# Patient Record
Sex: Female | Born: 2000 | Race: White | Hispanic: No | Marital: Single | Smoking: Never smoker
Health system: Southern US, Community
[De-identification: ages and names within clinical notes are randomized; demographics above are authoritative.]

## PROBLEM LIST (undated history)

## (undated) DIAGNOSIS — G8929 Other chronic pain: Secondary | ICD-10-CM

## (undated) DIAGNOSIS — K589 Irritable bowel syndrome without diarrhea: Secondary | ICD-10-CM

## (undated) DIAGNOSIS — R55 Syncope and collapse: Secondary | ICD-10-CM

## (undated) DIAGNOSIS — S060X9A Concussion with loss of consciousness of unspecified duration, initial encounter: Secondary | ICD-10-CM

## (undated) DIAGNOSIS — F32A Depression, unspecified: Secondary | ICD-10-CM

## (undated) DIAGNOSIS — F419 Anxiety disorder, unspecified: Secondary | ICD-10-CM

## (undated) DIAGNOSIS — Z87820 Personal history of traumatic brain injury: Secondary | ICD-10-CM

## (undated) DIAGNOSIS — S060XAA Concussion with loss of consciousness status unknown, initial encounter: Secondary | ICD-10-CM

## (undated) DIAGNOSIS — R519 Headache, unspecified: Secondary | ICD-10-CM

## (undated) HISTORY — PX: NO PAST SURGERIES: SHX2092

## (undated) HISTORY — DX: Concussion with loss of consciousness status unknown, initial encounter: S06.0XAA

## (undated) HISTORY — DX: Anxiety disorder, unspecified: F41.9

## (undated) HISTORY — DX: Syncope and collapse: R55

## (undated) HISTORY — DX: Concussion with loss of consciousness of unspecified duration, initial encounter: S06.0X9A

## (undated) HISTORY — DX: Personal history of traumatic brain injury: Z87.820

## (undated) HISTORY — DX: Other chronic pain: G89.29

## (undated) HISTORY — DX: Depression, unspecified: F32.A

## (undated) HISTORY — DX: Headache, unspecified: R51.9

## (undated) HISTORY — PX: OTHER SURGICAL HISTORY: SHX169

---
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
## 2019-02-12 DIAGNOSIS — N809 Endometriosis, unspecified: Secondary | ICD-10-CM | POA: Insufficient documentation

## 2019-04-05 ENCOUNTER — Other Ambulatory Visit: Payer: Self-pay

## 2019-04-05 DIAGNOSIS — Z20822 Contact with and (suspected) exposure to covid-19: Secondary | ICD-10-CM

## 2019-04-06 LAB — NOVEL CORONAVIRUS, NAA: SARS-CoV-2, NAA: NOT DETECTED

## 2019-04-22 ENCOUNTER — Other Ambulatory Visit: Payer: Self-pay

## 2019-04-22 DIAGNOSIS — Z20822 Contact with and (suspected) exposure to covid-19: Secondary | ICD-10-CM

## 2019-04-24 LAB — NOVEL CORONAVIRUS, NAA: SARS-CoV-2, NAA: NOT DETECTED

## 2019-05-18 ENCOUNTER — Other Ambulatory Visit: Payer: Self-pay

## 2019-05-18 DIAGNOSIS — Z20822 Contact with and (suspected) exposure to covid-19: Secondary | ICD-10-CM

## 2019-05-20 LAB — NOVEL CORONAVIRUS, NAA: SARS-CoV-2, NAA: NOT DETECTED

## 2019-06-14 ENCOUNTER — Other Ambulatory Visit: Payer: Self-pay

## 2019-06-14 DIAGNOSIS — Z20822 Contact with and (suspected) exposure to covid-19: Secondary | ICD-10-CM

## 2019-06-17 LAB — NOVEL CORONAVIRUS, NAA: SARS-CoV-2, NAA: NOT DETECTED

## 2020-07-25 ENCOUNTER — Encounter: Payer: Self-pay | Admitting: Neurology

## 2020-08-03 ENCOUNTER — Encounter: Payer: Self-pay | Admitting: Neurology

## 2020-08-03 ENCOUNTER — Other Ambulatory Visit: Payer: Self-pay

## 2020-08-03 ENCOUNTER — Ambulatory Visit (INDEPENDENT_AMBULATORY_CARE_PROVIDER_SITE_OTHER): Payer: No Typology Code available for payment source | Admitting: Neurology

## 2020-08-03 VITALS — BP 132/92 | HR 96 | Resp 18 | Ht 69.0 in | Wt 131.0 lb

## 2020-08-03 DIAGNOSIS — R404 Transient alteration of awareness: Secondary | ICD-10-CM

## 2020-08-03 DIAGNOSIS — F0781 Postconcussional syndrome: Secondary | ICD-10-CM | POA: Diagnosis not present

## 2020-08-03 NOTE — Progress Notes (Addendum)
NEUROLOGY CONSULTATION NOTE  Jaime Bauer MRN: [id number] DOB: 08-05-2001  Referring provider: Renae Gloss, MD Primary care provider: No PCP  Reason for consult:  Postconcussion syndrome   Subjective:  Jaime Bauer is a 19 year old right-handed female who presents for syncope and concussion.  She is accompanied by her mother who supplements history.  On 04/05/2020, she sustained a concussion with a soccer ball multiple times.  No loss of consciousness.  She felt "off" but kept playing.  Afterwards, she developed headache, nausea, vomiting, blurred vision, dizziness, and couldn't walk.  She was brought by EMS to a local ED.  She did not have any imaging performed.  She was treated with IVF, antiemetic and Advil.  She also has been "zoning out" spells for a couple of seconds.  She will be holding an object and then next thing the object was on the floor.  She doesn't actually lose consciousness and falls.  She has been having headaches daily. They are mainly right sided throbbing/pressure occipital pain (sometimes holocephalic) with photophobia and phonophobia.  She takes Advil and goes to sleep.  When she wakes up after a couple of hours, it usually has resolved.  She has ongoing nausea and phonophobia.  She has been taking Advil daily for the past 2 weeks.  She was seen by a functional neurologist, Dr. Remonia Richter, in [place] a couple of weeks after the concussion and still seeing her.  She had some vestibular rehab and physical therapy which was not effective.  She was prescribed fish oil and a multivitamin.  She has only noted minimal improvement.  In October, she hit her head on the nightstand in her sleep which aggravated her symptoms .  She had to drop down to 1 class in college because of trouble thinking and focusing.  She still has not been able to drive.  Trouble falling asleep but when falls asleep dead to world.  She has history of depression and anxiety which she reports isn't any  worse than prior to concussion.  Prior history of concussions:  At least history of 4 concussions all during soccer or flag football - no loss of consciousness.    History of migraines usually mentrual related  Current medications:  Prozac 40mg  daily, MVI, Yasmin  07/10/2020 LABS:  CMP with Na 137, K 4, Cl 104, CO2 27, Ca 9.6, t bili 0.4, ALP 28, AST 13, ALT 14; CBC with WBC 6.5, HGB 12.1, HCT 36, PLT 300; TSH 1.85     PAST MEDICAL HISTORY: Past Medical History:  Diagnosis Date  . Concussion   . Syncope     PAST SURGICAL HISTORY: History reviewed. No pertinent surgical history.  MEDICATIONS: Outpatient Encounter Medications as of 08/03/2020  Medication Sig  . drospirenone-ethinyl estradiol (YASMIN) 3-0.03 MG tablet   . FLUoxetine (PROZAC) 40 MG capsule    No facility-administered encounter medications on file as of 08/03/2020.   ALLERGIES: No Known Allergies   FAMILY HISTORY: History reviewed. No pertinent family history.  SOCIAL HISTORY: Social History   Socioeconomic History  . Marital status: Single    Spouse name: Not on file  . Number of children: 0  . Years of education: 81  . Highest education level: Not on file  Occupational History  . 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 Smoking status: Never Smoker  . Smokeless tobacco: Never Used  Substance and Sexual Activity  . Alcohol use: Never  . Drug use: Never  . Sexual activity: Not on  file  Other Topics Concern  . Not on file  Social History Narrative   Right handed   Drinks caffeine   Two story home   Social Determinants of Health   Financial Resource Strain: Not on file  Food Insecurity: Not on file  Transportation Needs: Not on file  Physical Activity: Not on file  Stress: Not on file  Social Connections: Not on file  Intimate Partner Violence: Not on file    Objective:  Blood pressure (!) 132/92, pulse 96, resp. rate 18, height 5\' 9"  (1.753 m), weight 131 lb (59.4 kg), SpO2 96  %. General: No acute distress.  Patient appears well-groomed.   Head:  Normocephalic/atraumatic Eyes:  fundi examined but not visualized Neck: supple, no paraspinal tenderness, full range of motion Back: No paraspinal tenderness Heart: regular rate and rhythm Lungs: Clear to auscultation bilaterally. Vascular: No carotid bruits. Neurological Exam: Mental status:  [employer] Mental Exam 08/03/2020  Weekday Correct 1  Current year 1  What state are we in? 1  Amount spent 1  Amount left 2  # of Animals 2  5 objects recall 4  Number series 2  Hour markers 2  Time correct 0  Placed X in triangle correctly 1  Largest Figure 1  Name of female 2  Date back to work 2  Type of work 2  State she lived in 0  Total score 24   Cranial nerves: CN I: not tested CN II: pupils equal, round and reactive to light, visual fields intact CN III, IV, VI:  full range of motion, no nystagmus, no ptosis CN V: endorses slightly reduced sensation in right V2-V3 CN VII: upper and lower face symmetric CN VIII: hearing intact CN IX, X: gag intact, uvula midline CN XI: sternocleidomastoid and trapezius muscles intact CN XII: tongue midline Bulk & Tone: normal, no fasciculations. Motor:  muscle strength 5/5 throughout Sensation:  Endorses slightly reduced pinprick and vibratory sensation in right upper and lower extremities Deep Tendon Reflexes:  2+ throughout,  toes downgoing.   Finger to nose testing:  Without dysmetria.   Heel to shin:  Without dysmetria.   Gait:  Normal station and stride.  Able to turn.  Unsteady tandem walk.  Romberg positive  Assessment/Plan:   Postconcussion syndrome Staring spells  1.  Due to ongoing chronic postconcussion symptoms, and staring spells, will get MRI of brain without contrast 2.  Due to daily staring spells, will get routine EEG.  If normal, will proceed with 24 hour ambulatory EEG. 3.  Discussed rebound headache and to limit use of pain  relievers to no more than 2 days out of week or 10 days out of the month. 4.  For headache management, consider magnesium oxide 400mg  daily, riboflavin 400mg  daily and CoQ10 300mg  daily.  Advised to first discuss with Dr. 01-17-1996. 5.  Continue fish oil/omega 3 for cognitive benefits 6.  For anti-inflammatory properties, consider turmeric, alpha-lipoic acid and/or vit D.  Advised to first discuss with Dr. 7.  May consider switching from fluoxetine to nortriptyline during recovery, but I defer to Dr. 8.  Ongoing care with continue with Dr. . 8.  Follow up after testing.     Thank you for allowing me to take part in the care of this patient.  Remonia Richter, DO  CC:  Dr. Remonia Richter  Dr. Remonia Richter

## 2020-08-03 NOTE — Patient Instructions (Addendum)
1.  Will check MRI of brain without contrast. We have sent a referral to [facility] for your MRI and they will call you directly to schedule your appointment. They are located at [address] [facility]. If you need to contact them directly please call [phone].  2.  Will check routine EEG.  If unremarkable, will order ambulatory EEG  3.  To help improve COGNITIVE function: Agree with fish oil/omega 3    To help reduce HEADACHES: Coenzyme Q10 300mg  ONCE DAILY Riboflavin/Vitamin B2 400mg  ONCE DAILY Magnesium oxide 400mg  ONCE - TWICE DAILY May stop after headaches are resolved.                                                                                               To help with INSOMNIA: Melatonin 3-5mg  AT BEDTIME Tart cherry extract, any dose at night    Other medicines to help decrease inflammation Alpha Lipoic Acid 100mg  TWICE DAILY Turmeric 500mg  twice daily Vitamin D 4000 IU daily for 2 weeks then 2000 IU daily thereafter.  CHECK WITH DR. GABELLA BEFORE INITIATING ANY OF THESE VITAMINS AND SUPPLEMENTS.  ALSO ASK HER OPINION ABOUT NORTRIPTYLINE IN PLACE OF THE FLUOXETINE.  4.  Limit use of pain relievers to no more than 2 days out of week to prevent risk of rebound or medication-overuse headache. 5.  Follow up after testing.

## 2020-08-09 ENCOUNTER — Other Ambulatory Visit: Payer: Self-pay

## 2020-08-09 ENCOUNTER — Ambulatory Visit (INDEPENDENT_AMBULATORY_CARE_PROVIDER_SITE_OTHER): Payer: No Typology Code available for payment source | Admitting: Neurology

## 2020-08-09 DIAGNOSIS — R404 Transient alteration of awareness: Secondary | ICD-10-CM

## 2020-08-09 DIAGNOSIS — F0781 Postconcussional syndrome: Secondary | ICD-10-CM

## 2020-08-10 NOTE — Procedures (Signed)
ELECTROENCEPHALOGRAM REPORT  Date of Study: 08/09/2020  Patient's Name: Jaime Bauer MRN: [id number] Date of Birth: 01/11/2001  Clinical History: 20 year old female with postconcussion syndrome and staring spells  Medications: YASMIN 3-0.03 MG tablet PROZAC 40 MG capsule  Technical Summary: A multichannel digital EEG recording measured by the international 10-20 system with electrodes applied with paste and impedances below 5000 ohms performed in our laboratory with EKG monitoring in an awake and drowsy patient.  Hyperventilation was not performed as patient is wearing face mask due to the COVID-19 pandemic.  Photic stimulation was performed.  The digital EEG was referentially recorded, reformatted, and digitally filtered in a variety of bipolar and referential montages for optimal display.    Description: The patient is awake and drowsy during the recording.  During maximal wakefulness, there is a symmetric, medium voltage 10 Hz posterior dominant rhythm that attenuates with eye opening.  The record is symmetric.  Stage 2 sleep was not seen.  Photic stimulation did not elicit any abnormalities.  There were no epileptiform discharges or electrographic seizures seen.    EKG lead was unremarkable.  Impression: This awake and drowsy EEG is normal.    Clinical Correlation: A normal EEG does not exclude a clinical diagnosis of epilepsy.  If further clinical questions remain, prolonged EEG may be helpful.  Clinical correlation is advised.   Shon Millet, DO

## 2020-08-11 ENCOUNTER — Other Ambulatory Visit: Payer: Self-pay

## 2020-08-11 DIAGNOSIS — F0781 Postconcussional syndrome: Secondary | ICD-10-CM

## 2020-08-16 ENCOUNTER — Other Ambulatory Visit: Payer: Self-pay

## 2020-08-16 ENCOUNTER — Ambulatory Visit (INDEPENDENT_AMBULATORY_CARE_PROVIDER_SITE_OTHER): Payer: No Typology Code available for payment source | Admitting: Neurology

## 2020-08-16 DIAGNOSIS — F0781 Postconcussional syndrome: Secondary | ICD-10-CM

## 2020-08-17 NOTE — Procedures (Signed)
ELECTROENCEPHALOGRAM REPORT  Dates of Recording: 08/16/2020 at 07:58 to 08/17/2020 at 08:44  Patient's Name: Jaime Bauer MRN: [id number] Date of Birth: 04-20-2001   Procedure: 24-hour ambulatory EEG  History: 20 year old female with postconcussion syndrome and staring spells.  Medications:  Fluoxetine Yasmin  Technical Summary: This is a 24-hour multichannel digital EEG recording measured by the international 10-20 system with electrodes applied with paste and impedances below 5000 ohms performed as portable with EKG monitoring.  The digital EEG was referentially recorded, reformatted, and digitally filtered in a variety of bipolar and referential montages for optimal display.    DESCRIPTION OF RECORDING: During maximal wakefulness, the background activity consisted of a symmetric 10Hz  posterior dominant rhythm which was reactive to eye opening.  There were no epileptiform discharges or focal slowing seen in wakefulness.  During the recording, the patient progresses through wakefulness, drowsiness, and Stage 2 sleep.  Again, there were no epileptiform discharges seen.  Events: Patient exhibited several push button events in which she described that she "zoned out" (15:46, 17:10, 21:23).  During some of these events, she appeared to be staring off. No convulsions or stereotypical movements.  There were no electrographic seizures seen.  EKG lead was unremarkable.  IMPRESSION: This 24-hour ambulatory EEG study is normal.    CLINICAL CORRELATION: Normal EEG background during her habitual spells suggest these events are not epileptic.   , DO

## 2020-08-18 NOTE — Progress Notes (Signed)
Pt advised of her EEG results. To keep her f/u appt in Feb.

## 2020-08-22 ENCOUNTER — Other Ambulatory Visit: Payer: No Typology Code available for payment source

## 2020-08-28 ENCOUNTER — Ambulatory Visit
Admission: RE | Admit: 2020-08-28 | Discharge: 2020-08-28 | Disposition: A | Discharge status: Home / Self Care | Payer: No Typology Code available for payment source | Source: Ambulatory Visit | Attending: Neurology | Admitting: Neurology

## 2020-08-28 ENCOUNTER — Other Ambulatory Visit: Payer: Self-pay

## 2020-08-28 ENCOUNTER — Telehealth: Payer: Self-pay | Admitting: Neurology

## 2020-08-28 DIAGNOSIS — F0781 Postconcussional syndrome: Secondary | ICD-10-CM

## 2020-08-28 DIAGNOSIS — R404 Transient alteration of awareness: Secondary | ICD-10-CM

## 2020-08-28 NOTE — Telephone Encounter (Signed)
Pt advised of MRI results. Will discuss at her f/u visit.

## 2020-08-28 NOTE — Telephone Encounter (Signed)
Patient got results of MRI on mychart and would like someone to give her a call to explain it to her.

## 2020-09-02 ENCOUNTER — Other Ambulatory Visit: Payer: No Typology Code available for payment source

## 2020-09-03 NOTE — Progress Notes (Signed)
NEUROLOGY FOLLOW UP OFFICE NOTE  Jaime Bauer [id number]   Subjective:  Jaime Bauer is a 20 year old right-handed female who follows up for postconcussion syndrome.  She is accompanied by her mother who supplements history.  UPDATE: Routine EEG on 08/09/2020 was normal.  24 hour ambulatory EEG on 1/12 to 08/17/2020 was normal, capturing habitual staring spells with no electrographic correlate.  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    Still with episodes of nausea and vomiting.  It happened twice in the past 2 weeks.  Still with low energy and fatigue.  Still with persistent headache.  Stopped taking Advil daily - now only once a week.  Still dizzy.  Still trouble falling asleep.    HISTORY: On 04/05/2020, she sustained a concussion with a soccer ball multiple times.  No loss of consciousness.  She felt "off" but kept playing.  Afterwards, she developed headache, nausea, vomiting, blurred vision, dizziness, and couldn't walk.  She was brought by EMS to a local ED.  She did not have any imaging performed.  She was treated with IVF, antiemetic and Advil.  She also has been "zoning out" spells for a couple of seconds.  She will be holding an object and then next thing the object was on the floor.  She doesn't actually lose consciousness and falls.  She has been having headaches daily. They are mainly right sided throbbing/pressure occipital pain (sometimes holocephalic) with photophobia and phonophobia.  She takes Advil and goes to sleep.  When she wakes up after a couple of hours, it usually has resolved.  She has ongoing nausea and phonophobia.  She has been taking Advil daily for the past 2 weeks.  She was seen by a functional neurologist, Dr. Remonia Richter, in [place] a couple of weeks after the concussion and still seeing her.  She had some vestibular  rehab and physical therapy which was not effective.  She was prescribed fish oil and a multivitamin.  She has only noted minimal improvement.  In October, she hit her head on the nightstand in her sleep which aggravated her symptoms .  She had to drop down to 1 class in college because of trouble thinking and focusing.  She still has not been able to drive.  Trouble falling asleep but when falls asleep dead to world.  She has history of depression and anxiety which she reports isn't any worse than prior to concussion.  Prior history of concussions:  At least history of 4 concussions all during soccer or flag football - no loss of consciousness.    History of migraines usually mentrual related  Current medications:  Prozac 40mg  daily, MVI, Yasmin  PAST MEDICAL HISTORY: Past Medical History:  Diagnosis Date  . Concussion   . Syncope     MEDICATIONS: Current Outpatient Medications on File Prior to Visit  Medication Sig Dispense Refill  . drospirenone-ethinyl estradiol (YASMIN) 3-0.03 MG tablet     . FLUoxetine (PROZAC) 40 MG capsule      No current facility-administered medications on file prior to visit.    ALLERGIES: No Known Allergies  FAMILY HISTORY: No family history on file.   SOCIAL HISTORY: Social History   Socioeconomic History  . Marital status: Single    Spouse name: Not on file  . Number of children: 0  . Years of  education: 14  . Highest education level: Not on file  Occupational History  . 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 Smoking status: Never Smoker  . Smokeless tobacco: Never Used  Substance and Sexual Activity  . Alcohol use: Never  . Drug use: Never  . Sexual activity: Not on file  Other Topics Concern  . Not on file  Social History Narrative   Right handed   Drinks caffeine   Two story home   Social Determinants of Health   Financial Resource Strain: Not on file  Food Insecurity: Not on file  Transportation Needs: Not on file   Physical Activity: Not on file  Stress: Not on file  Social Connections: Not on file  Intimate Partner Violence: Not on file     Objective:  Blood pressure 126/85, pulse (!) 57, resp. rate 18, height 5\' 9"  (1.753 m), weight 134 lb (60.8 kg), SpO2 99 %. General: No acute distress.  Patient appears well-groomed.     Assessment/Plan:   Prolonged postconcussion syndrome.  Not improving.  Staring spells - nonepileptic.  Choroid fissure cyst - incidental finding  She has exhausted the therapies that I would recommend:  Vitamins/supplements, vestibular rehab, cognitive rehab.  She has limited use of analgesics to no more than 2 days out of the week.  May consider starting nortriptyline however while that may help with headache, I don't think it will help with all of her symptoms.  At this point, I think she should be evaluated by a concussion specialist rather than a general neurologist.  I am not aware of anybody but will research.  , DO

## 2020-09-05 ENCOUNTER — Other Ambulatory Visit: Payer: Self-pay

## 2020-09-05 ENCOUNTER — Encounter: Payer: Self-pay | Admitting: Neurology

## 2020-09-05 ENCOUNTER — Ambulatory Visit (INDEPENDENT_AMBULATORY_CARE_PROVIDER_SITE_OTHER): Payer: No Typology Code available for payment source | Admitting: Neurology

## 2020-09-05 VITALS — BP 126/85 | HR 57 | Resp 18 | Ht 69.0 in | Wt 134.0 lb

## 2020-09-05 DIAGNOSIS — F0781 Postconcussional syndrome: Secondary | ICD-10-CM | POA: Diagnosis not present

## 2020-09-05 DIAGNOSIS — G930 Cerebral cysts: Secondary | ICD-10-CM

## 2020-09-05 NOTE — Patient Instructions (Signed)
I think at this point, you should be seen by a concussion specialist.  Off-hand, I do not know anybody.  I will ask colleagues regarding concussion specialists in the area and let you know

## 2020-09-06 ENCOUNTER — Telehealth: Payer: Self-pay | Admitting: Neurology

## 2020-09-06 DIAGNOSIS — F0781 Postconcussional syndrome: Secondary | ICD-10-CM

## 2020-09-06 NOTE — Telephone Encounter (Signed)
Patient mother called and states that the insurance will not pay for the EEG on 08-09-20. She states that insurance states that they need a letter from [place] to let them know why the patient needed a EEG.   Please call patient mother  Fax the letter to [phone] attn grievance admin

## 2020-09-06 NOTE — Telephone Encounter (Signed)
I am not familiar with either of them.  I would be happy to refer her to either physician.

## 2020-09-06 NOTE — Telephone Encounter (Signed)
Referral faxed to number given. [facility]

## 2020-09-06 NOTE — Telephone Encounter (Signed)
Patient's mother called to report that she spoke with the specialist office in regards to her daughter's situation and was told the have the referral sent for Dr Hughie Closs. She wasn't sure if Dr Everlena Cooper knew either of them or which one would be better.

## 2020-09-06 NOTE — Telephone Encounter (Signed)
Patient's mother called: she states that Dr Everlena Cooper mentioned to them at daughter's appt yesterday he could ask his colleagues for a recommendation on a specialist for her daughter. She wanted to add that if Dr Everlena Cooper has a specialist that he would recommend, that's who they would prefer. They had just done some research on their own and found the Drs listed below but they don't know if they even take their insurance or would be a good fit.

## 2020-09-06 NOTE — Telephone Encounter (Signed)
Patient called to request a referral to a concussion specialist. Please fax to Dr Kathe Mariner [phone]

## 2020-09-07 NOTE — Telephone Encounter (Signed)
Spoke with patient's mom to let her know that I was faxing the requested info to th insurance listed below.

## 2020-09-12 ENCOUNTER — Telehealth: Payer: Self-pay | Admitting: Neurology

## 2020-09-12 NOTE — Telephone Encounter (Signed)
Spoke to pt mother, Advised mother I spoke to them on 09/06/20 and ask which do they prefer, Per last note they were supposed to wait and see if the other providers know someone, but the pt and the mother did some research and found a provider at [facility].  On 09/06/20, Per Daughter her mother states please send referral to [facility]. Referral sent that day per pt.   Per Dr.Jaffe he did speak to his colleague and they do not know a speacilist here in [place]. Please keep referral sent to [facility] done 09/06/20.    Telephone call back to pt mother Laurene Footman we will keep the referral sent to [facility].

## 2020-09-12 NOTE — Telephone Encounter (Signed)
Patient's mother called to check on the status of the referral Dr Everlena Cooper was going to place for her daughter for a specialist. She states that Dr Everlena Cooper was going to check with his colleagues to get a recommendation on where patient should go. They would like Dr Moises Blood recommendation of a specialist in the [place] area since Dr Everlena Cooper recommended that patient not drive. Please call back.

## 2020-09-14 ENCOUNTER — Other Ambulatory Visit: Payer: Self-pay

## 2020-09-14 ENCOUNTER — Encounter (HOSPITAL_COMMUNITY): Payer: Self-pay | Admitting: Emergency Medicine

## 2020-09-14 ENCOUNTER — Emergency Department (HOSPITAL_COMMUNITY)
Admission: EM | Admit: 2020-09-14 | Discharge: 2020-09-15 | Disposition: A | Discharge status: Home / Self Care | Payer: No Typology Code available for payment source | Attending: Emergency Medicine | Admitting: Emergency Medicine

## 2020-09-14 DIAGNOSIS — R112 Nausea with vomiting, unspecified: Secondary | ICD-10-CM | POA: Insufficient documentation

## 2020-09-14 DIAGNOSIS — R519 Headache, unspecified: Secondary | ICD-10-CM | POA: Diagnosis present

## 2020-09-14 DIAGNOSIS — H5319 Other subjective visual disturbances: Secondary | ICD-10-CM | POA: Insufficient documentation

## 2020-09-14 LAB — CBC WITH DIFFERENTIAL/PLATELET
Abs Immature Granulocytes: 0.02 10*3/uL (ref 0.00–0.07)
Basophils Absolute: 0 10*3/uL (ref 0.0–0.1)
Basophils Relative: 0 %
Eosinophils Absolute: 0.1 10*3/uL (ref 0.0–0.5)
Eosinophils Relative: 1 %
HCT: 41.4 % (ref 36.0–46.0)
Hemoglobin: 13.5 g/dL (ref 12.0–15.0)
Immature Granulocytes: 0 %
Lymphocytes Relative: 36 %
Lymphs Abs: 2.8 10*3/uL (ref 0.7–4.0)
MCH: 29.3 pg (ref 26.0–34.0)
MCHC: 32.6 g/dL (ref 30.0–36.0)
MCV: 89.8 fL (ref 80.0–100.0)
Monocytes Absolute: 0.5 10*3/uL (ref 0.1–1.0)
Monocytes Relative: 6 %
Neutro Abs: 4.6 10*3/uL (ref 1.7–7.7)
Neutrophils Relative %: 57 %
Platelets: 337 10*3/uL (ref 150–400)
RBC: 4.61 MIL/uL (ref 3.87–5.11)
RDW: 11.7 % (ref 11.5–15.5)
WBC: 8 10*3/uL (ref 4.0–10.5)
nRBC: 0 % (ref 0.0–0.2)

## 2020-09-14 LAB — BASIC METABOLIC PANEL
Anion gap: 11 (ref 5–15)
BUN: 14 mg/dL (ref 6–20)
CO2: 25 mmol/L (ref 22–32)
Calcium: 9.8 mg/dL (ref 8.9–10.3)
Chloride: 103 mmol/L (ref 98–111)
Creatinine, Ser: 0.77 mg/dL (ref 0.44–1.00)
GFR, Estimated: >60 mL/min (ref >60)
Glucose, Bld: 93 mg/dL (ref 70–99)
Potassium: 4 mmol/L (ref 3.5–5.1)
Sodium: 139 mmol/L (ref 135–145)

## 2020-09-14 LAB — I-STAT BETA HCG BLOOD, ED (MC, WL, AP ONLY): I-stat hCG, quantitative: <5 m[IU]/mL (ref <5)

## 2020-09-14 MED ORDER — KETOROLAC TROMETHAMINE 30 MG/ML IJ SOLN
30.0000 mg | Freq: Once | INTRAMUSCULAR | Status: AC
  Administered 2020-09-15: 30 mg via INTRAVENOUS
  Filled 2020-09-14: qty 1

## 2020-09-14 MED ORDER — SODIUM CHLORIDE 0.9 % IV BOLUS
1000.0000 mL | Freq: Once | INTRAVENOUS | Status: AC
  Administered 2020-09-15: 1000 mL via INTRAVENOUS

## 2020-09-14 MED ORDER — ONDANSETRON 4 MG PO TBDP
4.0000 mg | ORAL_TABLET | Freq: Once | ORAL | Status: AC
  Administered 2020-09-14: 4 mg via ORAL
  Filled 2020-09-14: qty 1

## 2020-09-14 MED ORDER — PROCHLORPERAZINE EDISYLATE 10 MG/2ML IJ SOLN
10.0000 mg | Freq: Once | INTRAMUSCULAR | Status: AC
  Administered 2020-09-15: 10 mg via INTRAVENOUS
  Filled 2020-09-14: qty 2

## 2020-09-14 NOTE — ED Provider Notes (Signed)
[facility] EMERGENCY DEPARTMENT Provider Note   CSN: [id number] Arrival date & time: 09/14/20  1840     History Chief Complaint  Patient presents with  . Headache    Jaime Bauer is a 20 y.o. female.  The history is provided by the patient and medical records.  Headache Associated symptoms: nausea and vomiting     20 year old female with history of concussion in September 2020 while playing soccer, presenting to the ED with headache.  Has had intermittent headaches since concussion occurred, has been seen by neurology locally here in [place].  She has exhausted all of her therapy options here and is currently in the process of being referred to concussion specialist.  States 2 days ago she developed headache along the right side of her head, throbbing in nature with associated photophobia.  This is typical when her headaches developed.  She has had vomiting for most of the day today, was able to eat half a bagel this morning.  She denies any new head injury or trauma.  She is not had any fever, chills, neck pain, or stiffness.  States normally Aleve manages her headaches but has not had any improvement with this today.  Past Medical History:  Diagnosis Date  . Concussion   . Syncope     There are no problems to display for this patient.   History reviewed. No pertinent surgical history.   OB History   No obstetric history on file.     No family history on file.  Social History   Tobacco Use  . Smoking status: Never Smoker  . Smokeless tobacco: Never Used  Substance Use Topics  . Alcohol use: Never  . Drug use: Never    Home Medications Prior to Admission medications   Medication Sig Start Date End Date Taking? Authorizing Provider  drospirenone-ethinyl estradiol (YASMIN) 3-0.03 MG tablet  05/05/20   [provider]  FLUoxetine (PROZAC) 40 MG capsule  01/04/19   [provider]    Allergies    Patient has no known  allergies.  Review of Systems   Review of Systems  Gastrointestinal: Positive for nausea and vomiting.  Neurological: Positive for headaches.  All other systems reviewed and are negative.   Physical Exam Updated Vital Signs BP (!) 133/97 (BP Location: Right Arm)   Pulse (!) 102   Temp 98.5 F (36.9 C) (Oral)   Resp 17   Ht 5\' 9"  (1.753 m)   Wt 65 kg   LMP 09/12/2020   SpO2 100%   BMI 21.16 kg/m   Physical Exam Vitals and nursing note reviewed.  Constitutional:      General: She is not in acute distress.    Appearance: She is well-developed and well-nourished. She is not diaphoretic.  HENT:     Head: Normocephalic and atraumatic.     Right Ear: External ear normal.     Left Ear: External ear normal.  Eyes:     Extraocular Movements: EOM normal.     Conjunctiva/sclera: Conjunctivae normal.     Pupils: Pupils are equal, round, and reactive to light.  Neck:     Comments: No rigidity, no meningismus Cardiovascular:     Rate and Rhythm: Normal rate and regular rhythm.     Heart sounds: Normal heart sounds. No murmur heard.   Pulmonary:     Effort: Pulmonary effort is normal. No respiratory distress.     Breath sounds: Normal breath sounds. No wheezing or rhonchi.  Abdominal:     General: Bowel sounds are normal.     Palpations: Abdomen is soft.     Tenderness: There is no abdominal tenderness. There is no guarding.  Musculoskeletal:        General: No edema. Normal range of motion.     Cervical back: Full passive range of motion without pain, normal range of motion and neck supple. No rigidity.  Skin:    General: Skin is warm and dry.     Findings: No rash.  Neurological:     Mental Status: She is alert and oriented to person, place, and time.     Cranial Nerves: No cranial nerve deficit.     Sensory: No sensory deficit.     Motor: No tremor or seizure activity.     Deep Tendon Reflexes: Strength normal.     Comments: AAOx3, answering questions and following  commands appropriately; equal strength UE and LE bilaterally; CN grossly intact; moves all extremities appropriately without ataxia; no focal neuro deficits or facial asymmetry appreciated  Psychiatric:        Mood and Affect: Mood and affect normal.        Behavior: Behavior normal.        Thought Content: Thought content normal.     ED Results / Procedures / Treatments   Labs (all labs ordered are listed, but only abnormal results are displayed) Labs Reviewed  CBC WITH DIFFERENTIAL/PLATELET  BASIC METABOLIC PANEL  I-STAT BETA HCG BLOOD, ED ([facility], [facility], [facility] ONLY)    EKG None  Radiology No results found.  Procedures Procedures   Medications Ordered in ED Medications  ondansetron (ZOFRAN-ODT) disintegrating tablet 4 mg (4 mg Oral Given 09/14/20 2047)  sodium chloride 0.9 % bolus 1,000 mL (0 mLs Intravenous Stopped 09/15/20 0100)  ketorolac (TORADOL) 30 MG/ML injection 30 mg (30 mg Intravenous Given 09/15/20 0014)  prochlorperazine (COMPAZINE) injection 10 mg (10 mg Intravenous Given 09/15/20 0015)    ED Course  I have reviewed the triage vital signs and the nursing notes.  Pertinent labs & imaging results that were available during my care of the patient were reviewed by me and considered in my medical decision making (see chart for details).    MDM Rules/Calculators/A&P  20 year old female here with persistent headaches following concussion in September 2021.  She is awake, alert, properly oriented here.  Her neurologic exam is nonfocal.  She has no clinical signs or symptoms suggestive of meningitis.  Currently in the process of being referred to concussion specialist.  Will treat with migraine cocktail here and reassess.  Feeling better after medications here in the ED.  Tolerating PO.  Remains neurologically intact.  Stable for discharge.  Will follow-up with concussion specialist-- given information to follow-up with them if she does not hear back soon about her referral.   Return here for any new/acute changes.  Final Clinical Impression(s) / ED Diagnoses Final diagnoses:  Bad headache    Rx / DC Orders ED Discharge Orders    None       Garlon Hatchet, PA-C 09/15/20 [id number]    Geoffery Lyons, MD 09/15/20 2332

## 2020-09-14 NOTE — ED Triage Notes (Signed)
Patient reports persistent headaches since concussion several months ago with occasional emesis and mild photophobia . Alert and oriented/respirations unlabored , no fever or chills .

## 2020-09-15 ENCOUNTER — Telehealth: Payer: Self-pay | Admitting: Neurology

## 2020-09-15 DIAGNOSIS — R519 Headache, unspecified: Secondary | ICD-10-CM | POA: Diagnosis not present

## 2020-09-15 MED ORDER — ONDANSETRON 4 MG PO TBDP: 4.0000 mg | ORAL_TABLET | Freq: Three times a day (TID) | ORAL | 0 refills | Status: DC | PRN

## 2020-09-15 NOTE — Telephone Encounter (Signed)
The problem is that she came to me as a consult about further workup and other options for treating her chronic postconcussion symptoms.  I did a workup which was unremarkable.  I stated that treatment for her concussion was above my scope of practice and recommended referral to a headache specialist.  She did not make a follow up appointment with me since I was not a treating her.  If she is now having new intractable headaches she would need to make a new appointment

## 2020-09-15 NOTE — Discharge Instructions (Signed)
Glad you are feeling better.  I have written you some zofran to help with nausea/vomiting if that persists. Follow-up with Dr. Hughie Closs.  If you do not hear back soon, I would contact the office [phone]. Return here for any new/acute changes.

## 2020-09-15 NOTE — Telephone Encounter (Signed)
Telephone call to pt, to see how she is feeling today. No answer. LMOVM

## 2020-09-15 NOTE — Telephone Encounter (Signed)
Patient's mom called and left a message requesting a call back from a nurse. She said the patient is still not feeling well today and she'd like to speak with someone before the weekend.

## 2020-09-15 NOTE — Telephone Encounter (Signed)
Patient mother called and states the patient went to the ED at [facility] last night with a really bad painful headache that she had for two days. She states patient was throwing up, they did blood work and gave her IV fluids and medication at the ED. She states the pain was on the right side of the head and it was a sharp shooting pain.

## 2020-09-15 NOTE — Telephone Encounter (Signed)
Pt states she was seen in the ED last night, Headache cocktail given at the time. Pt reports the headache on the right side of her head, nausea and vomiting yesterday. Still nauseas today no vomiting.  Pain behind the eye. Pt reports her pain level right now is 4. Pt has a sensitive to light and sound.  Right eye aura( Blurry) Pt states the Zofran gives her headache after taking so she has tried the script given lat night.  Pt unable to keep any thing down for the last two days. Pt tried eating   Pt wanted to know if she could get something called in to help with the head and something different to take for the nausea?   Please advise.

## 2020-09-15 NOTE — Telephone Encounter (Signed)
Pt and pt Melissa advised of note below.

## 2020-09-26 ENCOUNTER — Ambulatory Visit: Payer: Self-pay | Admitting: Neurology

## 2020-10-03 ENCOUNTER — Encounter: Payer: Self-pay | Admitting: Medical-Surgical

## 2020-10-03 ENCOUNTER — Other Ambulatory Visit: Payer: Self-pay

## 2020-10-03 ENCOUNTER — Ambulatory Visit: Payer: No Typology Code available for payment source | Admitting: Medical-Surgical

## 2020-10-03 ENCOUNTER — Ambulatory Visit (INDEPENDENT_AMBULATORY_CARE_PROVIDER_SITE_OTHER): Payer: No Typology Code available for payment source | Admitting: Medical-Surgical

## 2020-10-03 ENCOUNTER — Telehealth: Payer: Self-pay

## 2020-10-03 VITALS — BP 99/63 | HR 80 | Temp 98.6°F | Ht 68.75 in | Wt 127.9 lb

## 2020-10-03 DIAGNOSIS — R197 Diarrhea, unspecified: Secondary | ICD-10-CM

## 2020-10-03 DIAGNOSIS — R112 Nausea with vomiting, unspecified: Secondary | ICD-10-CM

## 2020-10-03 DIAGNOSIS — Z23 Encounter for immunization: Secondary | ICD-10-CM | POA: Diagnosis not present

## 2020-10-03 DIAGNOSIS — F0781 Postconcussional syndrome: Secondary | ICD-10-CM

## 2020-10-03 DIAGNOSIS — K219 Gastro-esophageal reflux disease without esophagitis: Secondary | ICD-10-CM | POA: Diagnosis not present

## 2020-10-03 DIAGNOSIS — R634 Abnormal weight loss: Secondary | ICD-10-CM

## 2020-10-03 DIAGNOSIS — Z7689 Persons encountering health services in other specified circumstances: Secondary | ICD-10-CM

## 2020-10-03 DIAGNOSIS — Z114 Encounter for screening for human immunodeficiency virus [HIV]: Secondary | ICD-10-CM

## 2020-10-03 DIAGNOSIS — Z1159 Encounter for screening for other viral diseases: Secondary | ICD-10-CM

## 2020-10-03 LAB — TSH: TSH: 1 mIU/L

## 2020-10-03 MED ORDER — MECLIZINE HCL 25 MG PO TABS: 25.0000 mg | ORAL_TABLET | Freq: Three times a day (TID) | ORAL | 3 refills | Status: DC | PRN

## 2020-10-03 MED ORDER — DEXLANSOPRAZOLE 30 MG PO CPDR: 30.0000 mg | DELAYED_RELEASE_CAPSULE | Freq: Every day | ORAL | 1 refills | Status: DC

## 2020-10-03 MED ORDER — PANTOPRAZOLE SODIUM 40 MG PO TBEC: 40.0000 mg | DELAYED_RELEASE_TABLET | Freq: Every day | ORAL | 3 refills | Status: DC

## 2020-10-03 NOTE — Progress Notes (Signed)
New Patient Office Visit  Subjective:  Patient ID: Jaime Bauer, female    DOB: [birth year]  Age: 20 y.o. MRN: [id number]  CC:  Chief Complaint  Patient presents with  . Establish Care    HPI Jaime Bauer presents to establish care.   She is a very pleasant 20 year old accompanied by her mother. She has a couple of issues to discuss today:  GI issues. Has had about 1.5 years of dealing with persistent nausea and vomiting. She has poor PO intake and subsequent unintended weight loss due to her inability to hold foods down. Endorses abdominal pain that is centralized and described as burning. Does experience some burning in her chest at times. Endorses a bad taste in her mouth in the mornings but denies cough. She has bowel movements, described as diarrhea, every time she eats and sometimes in between. Has seen a couple of different GI providers and they do not have a good explanation for her symptoms. Reports she has tried several antacid medications, both over the counter and prescription without relief. Currently taking famotidine BID and Zofran prn but these aren't helping. 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 Denies fever, chills, melena, hematochezia, and hematemesis.   Concussion- experienced her fourth concussion approximately 6 months ago, all obtained from playing soccer. She no longer participates in the sport but is still having issues with post-concussive symptoms including dizziness, neck pain, headaches, and difficulty sleeping. She has seen neurology but unfortunately, he felt that the options for treatment had been exhausted and told her she would need a concussion specialist. As of the last instruction, the neurologist would speak with colleagues and let her know if he found a concussion specialist she could see. She has not heard back from that so far. She did go online and has found a specialist in the [facility]  network and has an appointment set up for early April. Notes that she is not allowed to drive due to her symptoms. Still experiences dizziness on a daily basis. Wonders if her concussion symptoms may be making her nausea worse. Has never tried meclizine for nausea/dizziness to her knowledge.   Past Medical History:  Diagnosis Date  . Anxiety   . Concussion   . Depression   . History of multiple concussions   . Syncope     Past Surgical History:  Procedure Laterality Date  . NO PAST SURGERIES      Family History  Problem Relation Age of Onset  . Skin cancer Mother   . Colon cancer Father   . Hypertension Paternal Uncle   . Stroke Maternal Grandfather     Social History   Socioeconomic History  . Marital status: Single    Spouse name: Not on file  . Number of children: 0  . Years of education: 31  . Highest education level: Not on file  Occupational History  . 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 Smoking status: Never Smoker  . Smokeless tobacco: Never Used  Vaping Use  . Vaping Use: Never used  Substance and Sexual Activity  . Alcohol use: Never  . Drug use: Never  . Sexual activity: Not Currently    Birth control/protection: Pill  Other Topics Concern  . Not on file  Social History Narrative   Right handed   Drinks caffeine   Two story home   Social Determinants of Health   Financial Resource Strain: Not on file  Food Insecurity: Not on file  Transportation Needs: Not on file  Physical Activity: Not on file  Stress: Not on file  Social Connections: Not on file  Intimate Partner Violence: Not on file    ROS Review of Systems  Constitutional: Positive for unexpected weight change. Negative for chills, fatigue and fever.  Respiratory: Negative for cough, chest tightness, shortness of breath and wheezing.   Cardiovascular: Negative for chest pain, palpitations and leg swelling.  Gastrointestinal: Positive for abdominal pain, diarrhea, nausea and vomiting.   Musculoskeletal: Positive for neck pain.  Neurological: Positive for dizziness, light-headedness and headaches.  Psychiatric/Behavioral: Positive for dysphoric mood and sleep disturbance. Negative for self-injury and suicidal ideas. The patient is nervous/anxious.     Objective:   Today's Vitals: BP 99/63   Pulse 80   Temp 98.6 F (37 C)   Ht 5' 8.75" (1.746 m)   Wt 127 lb 14.4 oz (58 kg)   LMP 09/30/2020   SpO2 100%   BMI 19.03 kg/m   Physical Exam Vitals reviewed.  Constitutional:      General: She is not in acute distress.    Appearance: Normal appearance.  HENT:     Head: Normocephalic and atraumatic.  Cardiovascular:     Rate and Rhythm: Normal rate and regular rhythm.     Pulses: Normal pulses.     Heart sounds: Normal heart sounds. No murmur heard. No friction rub. No gallop.   Pulmonary:     Effort: Pulmonary effort is normal. No respiratory distress.     Breath sounds: Normal breath sounds. No wheezing.  Skin:    General: Skin is warm and dry.  Neurological:     Mental Status: She is alert and oriented to person, place, and time.  Psychiatric:        Mood and Affect: Mood normal.        Behavior: Behavior normal.        Thought Content: Thought content normal.        Judgment: Judgment normal.     Assessment & Plan:   1. Encounter to establish care Reviewed available information and discussed health concerns with patient/mom.   2. Screening for HIV (human immunodeficiency virus) Very low risk so deferring for today.   3. Need for hepatitis C screening test Very low risk so deferring for today.   4. Need for influenza vaccination Flu vaccine given in office.  - Flu Vaccine QUAD 36+ mos IM  5. Weight loss Checking TSH. - TSH  6. Nausea and vomiting in adult/GERD Referring to GI. Suspect a number of factors are contributing including poorly controlled GERD. Recommend adding in a low dose PPI if possible. Sent Dexilant 40mg  daily. Unfortunately,  this is too costly. Starting Pantoprazole instead. Continue Zofran prn. - Ambulatory referral to Gastroenterology  7. Diarrhea, unspecified type Unclear etiology. Consider IBS but could be related to poor PO intake. Referring to GI. - Ambulatory referral to Gastroenterology  8. Post-concussive syndrome Since she already has an appointment with a concussion specialist, recommend keeping that as scheduled. Trial Meclizine to see if this will help with dizziness and nausea while waiting.   Outpatient Encounter Medications as of 10/03/2020  Medication Sig  . drospirenone-ethinyl estradiol (YASMIN) 3-0.03 MG tablet Take 1 tablet by mouth daily.  . famotidine (PEPCID) 20 MG tablet Take 20 mg by mouth 2 (two) times daily.  01-[birth year]-1994 FLUoxetine (PROZAC) 40 MG capsule Take 40 mg by mouth daily.  . meclizine (ANTIVERT) 25 MG  tablet Take 1 tablet (25 mg total) by mouth 3 (three) times daily as needed for dizziness or nausea.  . ondansetron (ZOFRAN ODT) 4 MG disintegrating tablet Take 1 tablet (4 mg total) by mouth every 8 (eight) hours as needed for nausea.  . [DISCONTINUED] Dexlansoprazole 30 MG capsule Take 1 capsule (30 mg total) by mouth daily.   No facility-administered encounter medications on file as of 10/03/2020.    Follow-up: Return in about 3 weeks (around 10/24/2020) for GI issues.   Thayer Ohm, DNP, APRN, FNP-BC [facility]

## 2020-10-03 NOTE — Telephone Encounter (Signed)
Dexilant prescription discontinued. Prescription sent for Protonix 40mg  daily. She is being referred to [facility] in [place]. Dr. Waterford is at that location but she may be scheduled with one of the other providers.

## 2020-10-03 NOTE — Telephone Encounter (Signed)
Pt called and said that she was told to check with the pharmacy regarding the cost of one of the Rx's written for her this morning and that if it was too expensive with the coupon to call back because there was another alternative available. Pt states with the coupon the Rx is still $166.00. She was also wanting to know which GI doctor she was being referred to.

## 2020-10-03 NOTE — Patient Instructions (Addendum)
Food Choices for Gastroesophageal Reflux Disease, Adult When you have gastroesophageal reflux disease (GERD), the foods you eat and your eating habits are very important. Choosing the right foods can help ease your discomfort. Think about working with a food expert (dietitian) to help you make good choices. What are tips for following this plan? Reading food labels  Look for foods that are low in saturated fat. Foods that may help with your symptoms include: ? Foods that have less than 5% of daily value (DV) of fat. ? Foods that have 0 grams of trans fat. Cooking  Do not fry your food.  Cook your food by baking, steaming, grilling, or broiling. These are all methods that do not need a lot of fat for cooking.  To add flavor, try to use herbs that are low in spice and acidity. Meal planning  Choose healthy foods that are low in fat, such as: ? Fruits and vegetables. ? Whole grains. ? Low-fat dairy products. ? Lean meats, fish, and poultry.  Eat small meals often instead of eating 3 large meals each day. Eat your meals slowly in a place where you are relaxed. Avoid bending over or lying down until 2-3 hours after eating.  Limit high-fat foods such as fatty meats or fried foods.  Limit your intake of fatty foods, such as oils, butter, and shortening.  Avoid the following as told by your doctor: ? Foods that cause symptoms. These may be different for different people. Keep a food diary to keep track of foods that cause symptoms. ? Alcohol. ? Drinking a lot of liquid with meals. ? Eating meals during the 2-3 hours before bed.   Lifestyle  Stay at a healthy weight. Ask your doctor what weight is healthy for you. If you need to lose weight, work with your doctor to do so safely.  Exercise for at least 30 minutes on 5 or more days each week, or as told by your doctor.  Wear loose-fitting clothes.  Do not smoke or use any products that contain nicotine or tobacco. If you need help  quitting, ask your doctor.  Sleep with the head of your bed higher than your feet. Use a wedge under the mattress or blocks under the bed frame to raise the head of the bed.  Chew sugar-free gum after meals. What foods should eat? Eat a healthy, well-balanced diet of fruits, vegetables, whole grains, low-fat dairy products, lean meats, fish, and poultry. Each person is different. Foods that may cause symptoms in one person may not cause any symptoms in another person. Work with your doctor to find foods that are safe for you. The items listed above may not be a complete list of what you can eat and drink. Contact a food expert for more options.   What foods should I avoid? Limiting some of these foods may help in managing the symptoms of GERD. Everyone is different. Talk with a food expert or your doctor to help you find the exact foods to avoid, if any. Fruits Any fruits prepared with added fat. Any fruits that cause symptoms. For some people, this may include citrus fruits, such as oranges, grapefruit, pineapple, and lemons. Vegetables Deep-fried vegetables. French fries. Any vegetables prepared with added fat. Any vegetables that cause symptoms. For some people, this may include tomatoes and tomato products, chili peppers, onions and garlic, and horseradish. Grains Pastries or quick breads with added fat. Meats and other proteins High-fat meats, such as fatty beef or pork,   hot dogs, ribs, ham, sausage, salami, and bacon. Fried meat or protein, including fried fish and fried chicken. Nuts and nut butters, in large amounts. Dairy Whole milk and chocolate milk. Sour cream. Cream. Ice cream. Cream cheese. Milkshakes. Fats and oils Butter. Margarine. Shortening. Ghee. Beverages Coffee and tea, with or without caffeine. Carbonated beverages. Sodas. Energy drinks. Fruit juice made with acidic fruits, such as orange or grapefruit. Tomato juice. Alcoholic drinks. Sweets and desserts Chocolate and  cocoa. Donuts. Seasonings and condiments Pepper. Peppermint and spearmint. Added salt. Any condiments, herbs, or seasonings that cause symptoms. For some people, this may include curry, hot sauce, or vinegar-based salad dressings. The items listed above may not be a complete list of what you should not eat and drink. Contact a food expert for more options. Questions to ask your doctor Diet and lifestyle changes are often the first steps that are taken to manage symptoms of GERD. If diet and lifestyle changes do not help, talk with your doctor about taking medicines. Where to find more information  International Foundation for Gastrointestinal Disorders: aboutgerd.org Summary  When you have GERD, food and lifestyle choices are very important in easing your symptoms.  Eat small meals often instead of 3 large meals a day. Eat your meals slowly and in a place where you are relaxed.  Avoid bending over or lying down until 2-3 hours after eating.  Limit high-fat foods such as fatty meats or fried foods. This information is not intended to replace advice given to you by your health care provider. Make sure you discuss any questions you have with your health care provider. Document Revised: 01/31/2020 Document Reviewed: 01/31/2020 Elsevier Patient Education  2021 Elsevier Inc.     Influenza (Flu) Vaccine (Inactivated or Recombinant): What You Need to Know 1. Why get vaccinated? Influenza vaccine can prevent influenza (flu). Flu is a contagious disease that spreads around the [place] every year, usually between October and May. Anyone can get the flu, but it is more dangerous for some people. Infants and young children, people 56 years and older, pregnant people, and people with certain health conditions or a weakened immune system are at greatest risk of flu complications. Pneumonia, bronchitis, sinus infections, and ear infections are examples of flu-related complications. If you have a  medical condition, such as heart disease, cancer, or diabetes, flu can make it worse. Flu can cause fever and chills, sore throat, muscle aches, fatigue, cough, headache, and runny or stuffy nose. Some people may have vomiting and diarrhea, though this is more common in children than adults. In an average year, thousands of people in the [place] die from flu, and many more are hospitalized. Flu vaccine prevents millions of illnesses and flu-related visits to the doctor each year. 2. Influenza vaccines CDC recommends everyone 6 months and older get vaccinated every flu season. Children 6 months through 28 years of age may need 2 doses during a single flu season. Everyone else needs only 1 dose each flu season. It takes about 2 weeks for protection to develop after vaccination. There are many flu viruses, and they are always changing. Each year a new flu vaccine is made to protect against the influenza viruses believed to be likely to cause disease in the upcoming flu season. Even when the vaccine doesn't exactly match these viruses, it may still provide some protection. Influenza vaccine does not cause flu. Influenza vaccine may be given at the same time as other vaccines. 3. Talk with your  health care provider Tell your vaccination provider if the person getting the vaccine:  Has had an allergic reaction after a previous dose of influenza vaccine, or has any severe, life-threatening allergies  Has ever had Guillain-Barr Syndrome (also called "GBS") In some cases, your health care provider may decide to postpone influenza vaccination until a future visit. Influenza vaccine can be administered at any time during pregnancy. People who are or will be pregnant during influenza season should receive inactivated influenza vaccine. People with minor illnesses, such as a cold, may be vaccinated. People who are moderately or severely ill should usually wait until they recover before getting influenza  vaccine. Your health care provider can give you more information. 4. Risks of a vaccine reaction  Soreness, redness, and swelling where the shot is given, fever, muscle aches, and headache can happen after influenza vaccination.  There may be a very small increased risk of Guillain-Barr Syndrome (GBS) after inactivated influenza vaccine (the flu shot). Young children who get the flu shot along with pneumococcal vaccine (PCV13) and/or DTaP vaccine at the same time might be slightly more likely to have a seizure caused by fever. Tell your health care provider if a child who is getting flu vaccine has ever had a seizure. People sometimes faint after medical procedures, including vaccination. Tell your provider if you feel dizzy or have vision changes or ringing in the ears. As with any medicine, there is a very remote chance of a vaccine causing a severe allergic reaction, other serious injury, or death. 5. What if there is a serious problem? An allergic reaction could occur after the vaccinated person leaves the clinic. If you see signs of a severe allergic reaction (hives, swelling of the face and throat, difficulty breathing, a fast heartbeat, dizziness, or weakness), call 9-1-1 and get the person to the nearest hospital. For other signs that concern you, call your health care provider. Adverse reactions should be reported to the Vaccine Adverse Event Reporting System (VAERS). Your health care provider will usually file this report, or you can do it yourself. Visit the VAERS website at www.vaers.LAgents.no or call 5487909776. VAERS is only for reporting reactions, and VAERS staff members do not give medical advice. 6. The National Vaccine Injury Compensation Program The [employer] Vaccine Injury Compensation Program (VICP) is a federal program that was created to compensate people who may have been injured by certain vaccines. Claims regarding alleged injury or death due to vaccination have a time  limit for filing, which may be as short as two years. Visit the VICP website at SpiritualWord.at or call (707) 644-8694 to learn about the program and about filing a claim. 7. How can I learn more?  Ask your health care provider.  Call your local or state health department.  Visit the website of the Food and Drug Administration (FDA) for vaccine package inserts and additional information at FinderList.no.  Contact the Centers for Disease Control and Prevention (CDC): ? Call 878-801-0560 (1-800-CDC-INFO) or ? Visit CDC's website at BiotechRoom.com.cy. Vaccine Information Statement Inactivated Influenza Vaccine (03/10/2020) This information is not intended to replace advice given to you by your health care provider. Make sure you discuss any questions you have with your health care provider. Document Revised: 04/27/2020 Document Reviewed: 04/27/2020 Elsevier Patient Education  2021 [employer].

## 2020-10-04 ENCOUNTER — Encounter: Payer: Self-pay | Admitting: Gastroenterology

## 2020-10-04 NOTE — Telephone Encounter (Addendum)
Patient aware of Rx change. Info provided regarding where and to who she was being referred to for GI issues. No further questions or concerns at this time.

## 2020-10-20 ENCOUNTER — Encounter: Payer: Self-pay | Admitting: Medical-Surgical

## 2020-10-23 MED ORDER — FAMOTIDINE 20 MG PO TABS: 20.0000 mg | ORAL_TABLET | Freq: Two times a day (BID) | ORAL | 1 refills | Status: DC

## 2020-10-24 ENCOUNTER — Other Ambulatory Visit: Payer: Self-pay

## 2020-10-24 ENCOUNTER — Ambulatory Visit (INDEPENDENT_AMBULATORY_CARE_PROVIDER_SITE_OTHER): Payer: No Typology Code available for payment source | Admitting: Medical-Surgical

## 2020-10-24 ENCOUNTER — Encounter: Payer: Self-pay | Admitting: Medical-Surgical

## 2020-10-24 VITALS — BP 105/72 | HR 70 | Temp 97.8°F | Ht 68.75 in | Wt 124.9 lb

## 2020-10-24 DIAGNOSIS — R112 Nausea with vomiting, unspecified: Secondary | ICD-10-CM

## 2020-10-24 DIAGNOSIS — R197 Diarrhea, unspecified: Secondary | ICD-10-CM | POA: Diagnosis not present

## 2020-10-24 DIAGNOSIS — K219 Gastro-esophageal reflux disease without esophagitis: Secondary | ICD-10-CM | POA: Diagnosis not present

## 2020-10-24 MED ORDER — DICYCLOMINE HCL 20 MG PO TABS: 20.0000 mg | ORAL_TABLET | Freq: Three times a day (TID) | ORAL | 0 refills | Status: DC

## 2020-10-24 NOTE — Progress Notes (Signed)
Subjective:    CC: GERD/diarrhea follow up  HPI: Pleasant 20 year old female presenting today for follow-up on GI issues.  At our last visit, she was started on pantoprazole 40 mg daily in conjunction with famotidine 20 mg twice daily.  Notes that the medications are well-tolerated without side effects.  She notes there may have been a small amount of improvement.  She has not had any further vomiting but she does have frequent nausea.  She is using Zofran to help manage that.  She is able to eat and drink but notes that most of her intake causes her to have significant diarrhea.  Some days, she reports being unable to get out of the bathroom due to the frequent bowel movements but other days that she has difficulty having a bowel movement.  Reports that on average, she does have at least 1 bowel movement daily.  Denies fever, chills, hematemesis, hematochezia, and melena.  Has an appointment with GI in early April for further evaluation.  I reviewed the past medical history, family history, social history, surgical history, and allergies today and no changes were needed.  Please see the problem list section below in epic for further details.  Past Medical History: Past Medical History:  Diagnosis Date  . Anxiety   . Concussion   . Depression   . History of multiple concussions   . Syncope    Past Surgical History: Past Surgical History:  Procedure Laterality Date  . NO PAST SURGERIES     Social History: Social History   Socioeconomic History  . Marital status: Single    Spouse name: Not on file  . Number of children: 0  . Years of education: 75  . Highest education level: Not on file  Occupational History  . 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 Smoking status: Never Smoker  . Smokeless tobacco: Never Used  Vaping Use  . Vaping Use: Never used  Substance and Sexual Activity  . Alcohol use: Never  . Drug use: Never  . Sexual activity: Not Currently    Birth  control/protection: Pill  Other Topics Concern  . Not on file  Social History Narrative   Right handed   Drinks caffeine   Two story home   Social Determinants of Health   Financial Resource Strain: Not on file  Food Insecurity: Not on file  Transportation Needs: Not on file  Physical Activity: Not on file  Stress: Not on file  Social Connections: Not on file   Family History: Family History  Problem Relation Age of Onset  . Skin cancer Mother   . Colon cancer Father   . Hypertension Paternal Uncle   . Stroke Maternal Grandfather    Allergies: No Known Allergies Medications: See med rec.  Review of Systems: See HPI for pertinent positives and negatives.   Objective:    General: Well Developed, well nourished, and in no acute distress.  Neuro: Alert and oriented x3.  HEENT: Normocephalic, atraumatic.  Skin: Warm and dry. Cardiac: Regular rate and rhythm, no murmurs rubs or gallops, no lower extremity edema.  Respiratory: Clear to auscultation bilaterally. Not using accessory muscles, speaking in full sentences. Abdomen: Soft, diffusely tender, nondistended. Bowel sounds + x 4 quadrants. No HSM appreciated.  Impression and Recommendations:    1. Diarrhea, unspecified type 2. Nausea and vomiting in adult 3. Gastroesophageal reflux disease without esophagitis Continue famotidine 20 mg twice daily and Protonix 40 mg daily.  Continue Zofran as needed.  Adding dicyclomine  20 mg 4 times daily as needed.  Plan to follow-up with GI as scheduled.  Advised to let me know if she is unable to tolerate dicyclomine or if it is unhelpful.  Patient verbalized understanding and is agreeable to the plan.  Return if symptoms worsen or fail to improve. ___________________________________________ Thayer Ohm, DNP, APRN, FNP-BC Primary Care and Sports Medicine [facility]

## 2020-10-30 ENCOUNTER — Encounter: Payer: Self-pay | Admitting: Medical-Surgical

## 2020-11-01 ENCOUNTER — Other Ambulatory Visit: Payer: Self-pay | Admitting: Medical-Surgical

## 2020-11-01 DIAGNOSIS — N926 Irregular menstruation, unspecified: Secondary | ICD-10-CM

## 2020-11-10 ENCOUNTER — Other Ambulatory Visit: Payer: No Typology Code available for payment source

## 2020-11-10 ENCOUNTER — Encounter: Payer: Self-pay | Admitting: Gastroenterology

## 2020-11-10 ENCOUNTER — Other Ambulatory Visit: Payer: Self-pay

## 2020-11-10 ENCOUNTER — Ambulatory Visit (INDEPENDENT_AMBULATORY_CARE_PROVIDER_SITE_OTHER): Payer: No Typology Code available for payment source | Admitting: Gastroenterology

## 2020-11-10 ENCOUNTER — Other Ambulatory Visit (INDEPENDENT_AMBULATORY_CARE_PROVIDER_SITE_OTHER): Payer: No Typology Code available for payment source

## 2020-11-10 VITALS — BP 100/80 | HR 88 | Ht 68.0 in | Wt 126.4 lb

## 2020-11-10 DIAGNOSIS — R634 Abnormal weight loss: Secondary | ICD-10-CM

## 2020-11-10 DIAGNOSIS — R194 Change in bowel habit: Secondary | ICD-10-CM

## 2020-11-10 DIAGNOSIS — R12 Heartburn: Secondary | ICD-10-CM

## 2020-11-10 DIAGNOSIS — R1114 Bilious vomiting: Secondary | ICD-10-CM

## 2020-11-10 DIAGNOSIS — R197 Diarrhea, unspecified: Secondary | ICD-10-CM

## 2020-11-10 DIAGNOSIS — K5900 Constipation, unspecified: Secondary | ICD-10-CM

## 2020-11-10 DIAGNOSIS — R630 Anorexia: Secondary | ICD-10-CM

## 2020-11-10 DIAGNOSIS — R198 Other specified symptoms and signs involving the digestive system and abdomen: Secondary | ICD-10-CM | POA: Diagnosis not present

## 2020-11-10 DIAGNOSIS — R0789 Other chest pain: Secondary | ICD-10-CM

## 2020-11-10 LAB — COMPREHENSIVE METABOLIC PANEL
ALT: 15 U/L (ref 0–35)
AST: 13 U/L (ref 0–37)
Albumin: 4.5 g/dL (ref 3.5–5.2)
Alkaline Phosphatase: 40 U/L (ref 39–117)
BUN: 13 mg/dL (ref 6–23)
CO2: 27 mEq/L (ref 19–32)
Calcium: 9.6 mg/dL (ref 8.4–10.5)
Chloride: 102 mEq/L (ref 96–112)
Creatinine, Ser: 0.72 mg/dL (ref 0.40–1.20)
GFR: 120.27 mL/min (ref >60.00)
Glucose, Bld: 86 mg/dL (ref 70–99)
Potassium: 4 mEq/L (ref 3.5–5.1)
Sodium: 137 mEq/L (ref 135–145)
Total Bilirubin: 0.3 mg/dL (ref 0.2–1.2)
Total Protein: 7.2 g/dL (ref 6.0–8.3)

## 2020-11-10 LAB — AMYLASE: Amylase: 35 U/L (ref 27–131)

## 2020-11-10 LAB — IBC + FERRITIN
Ferritin: 26.1 ng/mL (ref 10.0–291.0)
Iron: 21 ug/dL — ABNORMAL LOW (ref 42–145)
Saturation Ratios: 4.6 % — ABNORMAL LOW (ref 20.0–50.0)
Transferrin: 326 mg/dL (ref 212.0–360.0)

## 2020-11-10 LAB — CBC
HCT: 38.3 % (ref 36.0–46.0)
Hemoglobin: 13.1 g/dL (ref 12.0–15.0)
MCHC: 34.3 g/dL (ref 30.0–36.0)
MCV: 87.2 fl (ref 78.0–100.0)
Platelets: 271 10*3/uL (ref 150.0–400.0)
RBC: 4.4 Mil/uL (ref 3.87–5.11)
RDW: 12.4 % (ref 11.5–14.6)
WBC: 4.7 10*3/uL (ref 4.5–10.5)

## 2020-11-10 LAB — HIGH SENSITIVITY CRP: CRP, High Sensitivity: 2.81 mg/L (ref 0.000–5.000)

## 2020-11-10 LAB — LIPASE: Lipase: 15 U/L (ref 11.0–59.0)

## 2020-11-10 LAB — SEDIMENTATION RATE: Sed Rate: 11 mm/hr (ref 0–20)

## 2020-11-10 MED ORDER — SUPREP BOWEL PREP KIT 17.5-3.13-1.6 GM/177ML PO SOLN: 1.0000 | ORAL | 0 refills | Status: DC

## 2020-11-10 MED ORDER — PROCHLORPERAZINE MALEATE 10 MG PO TABS: 10.0000 mg | ORAL_TABLET | Freq: Three times a day (TID) | ORAL | 0 refills | Status: DC | PRN

## 2020-11-10 NOTE — Progress Notes (Signed)
GASTROENTEROLOGY OUTPATIENT CLINIC VISIT   Primary Care Provider Samuel Bouche, NP [address] [place] [address] [phone]  Referring Provider Samuel Bouche, NP [address] [place],   [address] [phone]  Patient Profile: Jaime Bauer is a 20 y.o. female with a pmh significant for family history colorectal cancer (father at age 52), anxiety/depression, concussions.  The patient presents to the [facility] for an evaluation and management of problem(s) noted below:  Problem List 1. Irregular bowel habits   2. Constipation, unspecified constipation type   3. Diarrhea, unspecified type   4. Unintentional weight loss   5. Bilious vomiting with nausea   6. Anorexia   7. Pyrosis   8. Atypical chest pain     History of Present Illness This is the patient's first visit to the outpatient [place] clinic.  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  Unfortunately as result of concussions, she is no longer able to play soccer.  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  The patient has been experiencing multiple GI symptoms for almost 2 years.  She states that when she was living at home she had been doing relatively well.  There were times where she would have decreased appetite and a quick gastrocolic reflex leading to a bowel movement after eating.  However as she moved here to [place] and school progressed she began to experience more significant worsening of her symptoms.  Patient began to experience abdominal pain generalized in the mid epigastrium and lower abdomen.  Acid reflux and heartburn symptoms were occurring more frequently.  She had bloating and abdominal distention.  She had continued anorexia and decreased appetite leading to a weight loss where she eventually lost over 20 pounds.  She was able to regain some of that  weight when she was on Remeron however it made her sleepy.  She has since had that medication stopped.  She has nausea with vomiting episodes once to twice weekly.  The timing of her vomiting is variable.  She describes only bilious vomitus no hematemesis or coffee-ground emesis.  In December 2020 in [place] she underwent an upper endoscopy but that is the only work-up that she has had.  She had been taking famotidine and then subsequently was initiated on pantoprazole but she is not sure if she is made any difference with being on these medications.  Her bowel habits alternate between diarrhea and constipation.  Her father as noted in her history has recently been diagnosed with colorectal cancer and underwent radiation and no surgery that she is aware of and her father was 66 years of age.  She does not take significant nonsteroidals or BC/Goody powders.  She has never had a colonoscopy.  GI Review of Systems Positive as above including times where she has early satiety when she does finally eat Negative for dysphagia, odynophagia, melena, hematochezia  Review of Systems General: Denies fevers/chills HEENT: Denies oral lesions Cardiovascular: Occasional chest pains while eating Pulmonary: Denies shortness of breath Gastroenterological: See HPI Genitourinary: Denies darkened urine Hematological: Denies easy bruising/bleeding Endocrine: Denies temperature intolerance Dermatological: Denies jaundice Psychological: Mood is stable   Medications Current Outpatient Medications  Medication Sig Dispense Refill  . dicyclomine (BENTYL) 20 MG tablet Take 1 tablet (20 mg total) by mouth 4 (four) times daily -  before meals and at bedtime. 120 tablet  0  . drospirenone-ethinyl estradiol (YASMIN) 3-0.03 MG tablet Take 1 tablet by mouth daily.    . famotidine (PEPCID) 20 MG tablet Take 1 tablet (20 mg total) by mouth 2 (two) times daily. 180 tablet 1  . FLUoxetine (PROZAC) 40 MG capsule Take 40 mg by mouth  daily.    . Na Sulfate-K Sulfate-Mg Sulf (SUPREP BOWEL PREP KIT) 17.5-3.13-1.6 GM/177ML SOLN Take 1 kit by mouth as directed. For colonoscopy prep 354 mL 0  . ondansetron (ZOFRAN ODT) 4 MG disintegrating tablet Take 1 tablet (4 mg total) by mouth every 8 (eight) hours as needed for nausea. 10 tablet 0  . pantoprazole (PROTONIX) 40 MG tablet Take 1 tablet (40 mg total) by mouth daily. 30 tablet 3  . prochlorperazine (COMPAZINE) 10 MG tablet Take 1 tablet (10 mg total) by mouth every 8 (eight) hours as needed for nausea or vomiting. 30 tablet 0   No current facility-administered medications for this visit.    Allergies No Known Allergies  Histories Past Medical History:  Diagnosis Date  . Anxiety   . Concussion   . Depression   . History of multiple concussions   . Syncope    Past Surgical History:  Procedure Laterality Date  . NO PAST SURGERIES     Social History   Socioeconomic History  . Marital status: Single    Spouse name: Not on file  . Number of children: 0  . Years of education: 33  . Highest education level: Not on file  Occupational History  . 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 Smoking status: Never Smoker  . Smokeless tobacco: Never Used  Vaping Use  . Vaping Use: Never used  Substance and Sexual Activity  . Alcohol use: Never  . Drug use: Never  . Sexual activity: Not Currently    Birth control/protection: Pill  Other Topics Concern  . Not on file  Social History Narrative   Right handed   Drinks caffeine   Two story home   Social Determinants of Health   Financial Resource Strain: Not on file  Food Insecurity: Not on file  Transportation Needs: Not on file  Physical Activity: Not on file  Stress: Not on file  Social Connections: Not on file  Intimate Partner Violence: Not on file   Family History  Problem Relation Age of Onset  . Skin cancer Mother   . Colon polyps Mother   . Colon cancer Father 46  . Colon polyps Father   .  Hypertension Paternal Uncle   . Stroke Maternal Grandfather   . Esophageal cancer Neg Hx   . Inflammatory bowel disease Neg Hx   . Liver disease Neg Hx   . Pancreatic cancer Neg Hx   . Stomach cancer Neg Hx    I have reviewed her medical, social, and family history in detail and updated the electronic medical record as necessary.    PHYSICAL EXAMINATION  BP 100/80 (BP Location: Left Arm, Patient Position: Sitting, Cuff Size: Normal)   Pulse 88   Ht $R'5\' 8"'ZB$  (1.727 m) Comment: height measured without shoes  Wt 126 lb 6 oz (57.3 kg)   LMP 11/09/2020   BMI 19.22 kg/m  Wt Readings from Last 3 Encounters:  11/10/20 126 lb 6 oz (57.3 kg)  10/24/20 124 lb 14.4 oz (56.7 kg)  10/03/20 127 lb 14.4 oz (58 kg)  GEN: NAD, appears stated age, doesn't appear chronically ill, accompanied by female friend PSYCH: Cooperative, without pressured speech  EYE: Conjunctivae pink, sclerae anicteric ENT: MMM, without oral ulcers CV: RR without R/Gs  RESP: CTAB posteriorly, without wheezing GI: NABS, soft, tenderness to palpation in lower abdomen bilaterally and midepigastrium, without rebound, mild volitional guarding is present in the lower abdomen, no hepatosplenomegaly appreciated MSK/EXT: No lower extremity edema SKIN: No jaundice NEURO:  Alert & Oriented x 3, no focal deficits   REVIEW OF DATA  I reviewed the following data at the time of this encounter:  GI Procedures and Studies  Patient reports previous endoscopy in December 2020 in [place] we will try to get the records   Laboratory Studies  No relevant studies to review  Imaging Studies  No relevant studies to review   ASSESSMENT  Jaime Bauer is a 20 y.o. female with a pmh significant for family history colorectal cancer (father at age 46), anxiety/depression, concussions.  The patient is seen today for evaluation and management of:  1. Irregular bowel habits   2. Constipation, unspecified constipation type   3. Diarrhea,  unspecified type   4. Unintentional weight loss   5. Bilious vomiting with nausea   6. Anorexia   7. Pyrosis   8. Atypical chest pain    The patient is hemodynamically stable.  Clinically however the patient has had significant issues ongoing for years now with significant progression of symptoms at this time.  Based on her health overall, the most likely etiology of symptoms would be considered functional and irritable bowel syndrome however that should not cause a significant amount of weight loss that she has experienced and continues to have at times.  At this point further laboratory work-up as well as imaging as well as stool studies as well as endoscopic evaluation is recommended.  We will perform this in an effort of trying to better understand her symptoms.  If we do not find an etiology for her symptoms with this extensive work-up then we will consider the use of TCAs however we would have to get approval from the patient's psychiatrist since she is already on other medications controlling her anxiety/depression.  We will try to obtain the previous upper endoscopy results from [place] as well.  The risks and benefits of endoscopic evaluation were discussed with the patient; these include but are not limited to the risk of perforation, infection, bleeding, missed lesions, lack of diagnosis, severe illness requiring hospitalization, as well as anesthesia and sedation related illnesses.  The patient is agreeable to proceed.  All patient questions were answered to the best of my ability, and the patient agrees to the aforementioned plan of action with follow-up as indicated.   PLAN  Laboratories as outlined below Stool studies as outlined below Fecal elastase to be obtained Abdominal ultrasound to be obtained Diagnostic endoscopy to be scheduled (esophageal/gastric/duodenal biopsies) Diagnostic colonoscopy to be scheduled (attempted TI and colon biopsies) Attempt to obtain outside EGD report  from [place] This Encounter  Procedures  . Stool Culture  . Ova and parasite examination  . US Abdomen Complete  . CBC  . Comp Met (CMET)  . Amylase  . Lipase  . Sedimentation rate  . CRP High sensitivity  . IBC + Ferritin  . IgA  . Clostridium difficile Toxin B, Qualitative, Real-Time PCR  . Pancreatic elastase, fecal  . Tissue transglutaminase, IgA  . Ambulatory referral to Gastroenterology    New Prescriptions   NA SULFATE-K SULFATE-MG SULF (SUPREP BOWEL PREP KIT) 17.5-3.13-1.6 GM/177ML SOLN    Take 1 kit  by mouth as directed. For colonoscopy prep   PROCHLORPERAZINE (COMPAZINE) 10 MG TABLET    Take 1 tablet (10 mg total) by mouth every 8 (eight) hours as needed for nausea or vomiting.   Modified Medications   No medications on file    Planned Follow Up No follow-ups on file.   Total Time in Face-to-Face and in Coordination of Care for patient including independent/personal interpretation/review of prior testing, medical history, examination, medication adjustment, communicating results with the patient directly, and documentation with the EHR is 45 minutes.   Justice Britain, MD [facility] Advanced Endoscopy Office # [phone]

## 2020-11-10 NOTE — Patient Instructions (Signed)
Your provider has requested that you go to the basement level for lab work before leaving today. Press "B" on the elevator. The lab is located at the first door on the left as you exit the elevator.  You have been scheduled for an endoscopy and colonoscopy. Please follow the written instructions given to you at your visit today. Please pick up your prep supplies at the pharmacy within the next 1-3 days. If you use inhalers (even only as needed), please bring them with you on the day of your procedure.   We have sent the following medications to your pharmacy for you to pick up at your convenience: Suprep, Compazine   You have been scheduled for an abdominal ultrasound at [facility] (1st floor of hospital) on 11/20/20 at 8:30am. Please arrive 15 minutes prior to your appointment for registration. Make certain not to have anything to eat or drink 6 hours prior to your appointment. Should you need to reschedule your appointment, please contact radiology at [phone]. This test typically takes about 30 minutes to perform.  Due to recent changes in healthcare laws, you may see the results of your imaging and laboratory studies on MyChart before your provider has had a chance to review them.  We understand that in some cases there may be results that are confusing or concerning to you. Not all laboratory results come back in the same time frame and the provider may be waiting for multiple results in order to interpret others.  Please give [place] 48 hours in order for your provider to thoroughly review all the results before contacting the office for clarification of your results.    If you are age 10 or younger, your body mass index should be between 19-25. Your Body mass index is 19.22 kg/m. If this is out of the aformentioned range listed, please consider follow up with your Primary Care Provider.   Thank you for choosing me and [facility].  Dr. Meridee Score

## 2020-11-11 ENCOUNTER — Encounter: Payer: Self-pay | Admitting: Gastroenterology

## 2020-11-13 LAB — EXTRA SPECIMEN

## 2020-11-13 LAB — IGA: Immunoglobulin A: 138 mg/dL (ref 47–310)

## 2020-11-13 LAB — TISSUE TRANSGLUTAMINASE, IGA: (tTG) Ab, IgA: <1 U/mL

## 2020-11-14 ENCOUNTER — Encounter: Payer: Self-pay | Admitting: Gastroenterology

## 2020-11-14 ENCOUNTER — Other Ambulatory Visit: Payer: Self-pay

## 2020-11-14 ENCOUNTER — Ambulatory Visit (INDEPENDENT_AMBULATORY_CARE_PROVIDER_SITE_OTHER): Payer: No Typology Code available for payment source

## 2020-11-14 VITALS — BP 114/82 | HR 78 | Resp 16 | Ht 69.0 in | Wt 125.0 lb

## 2020-11-14 DIAGNOSIS — R634 Abnormal weight loss: Secondary | ICD-10-CM | POA: Insufficient documentation

## 2020-11-14 DIAGNOSIS — R1114 Bilious vomiting: Secondary | ICD-10-CM | POA: Insufficient documentation

## 2020-11-14 DIAGNOSIS — R630 Anorexia: Secondary | ICD-10-CM | POA: Insufficient documentation

## 2020-11-14 DIAGNOSIS — K5900 Constipation, unspecified: Secondary | ICD-10-CM | POA: Insufficient documentation

## 2020-11-14 DIAGNOSIS — N926 Irregular menstruation, unspecified: Secondary | ICD-10-CM

## 2020-11-14 DIAGNOSIS — R0789 Other chest pain: Secondary | ICD-10-CM | POA: Insufficient documentation

## 2020-11-14 DIAGNOSIS — R12 Heartburn: Secondary | ICD-10-CM | POA: Insufficient documentation

## 2020-11-14 DIAGNOSIS — R197 Diarrhea, unspecified: Secondary | ICD-10-CM | POA: Insufficient documentation

## 2020-11-14 DIAGNOSIS — R198 Other specified symptoms and signs involving the digestive system and abdomen: Secondary | ICD-10-CM | POA: Insufficient documentation

## 2020-11-14 LAB — STOOL CULTURE: E coli, Shiga toxin Assay: NEGATIVE

## 2020-11-14 MED ORDER — NORETHINDRONE 0.35 MG PO TABS: 1.0000 | ORAL_TABLET | Freq: Every day | ORAL | 11 refills | Status: DC

## 2020-11-14 NOTE — Progress Notes (Signed)
GYNECOLOGY ANNUAL PREVENTATIVE CARE ENCOUNTER NOTE  History:     Jaime Bauer is a 20 y.o. G0P0000 female here for a routine annual gynecologic exam.  Current complaints: irregular vaginal bleeding. She reports she is having 2 periods a month on OCPs.  Denies discharge, pelvic pain, problems with intercourse or other gynecologic concerns.    She states she started her period when she was 89 and they have always been heavy. She was started on OCPs in high school which worked well for her. She stopped and restarted about a year ago and since then has had regular spotting. She switched pills about 6 months ago and since then has had 2 heavy periods a month. She reports the OCPs were causing migraines with her cycles. She also reports many GI complaints that she is currently trying to work up with her PCP.  She has one female partner with no concerns of possible pregnancy. She is unsure if she would like another method of birth control for bleeding.     Gynecologic History Patient's last menstrual period was 11/09/2020. Contraception: OCP (estrogen/progesterone) Last Pap: n/a due to age  Obstetric History OB History  Gravida Para Term Preterm AB Living  0 0 0 0 0 0  SAB IAB Ectopic Multiple Live Births  0 0 0 0 0    Past Medical History:  Diagnosis Date  . Anxiety   . Concussion   . Depression   . History of multiple concussions   . Syncope     Past Surgical History:  Procedure Laterality Date  . NO PAST SURGERIES      Current Outpatient Medications on File Prior to Visit  Medication Sig Dispense Refill  . dicyclomine (BENTYL) 20 MG tablet Take 1 tablet (20 mg total) by mouth 4 (four) times daily -  before meals and at bedtime. 120 tablet 0  . drospirenone-ethinyl estradiol (YASMIN) 3-0.03 MG tablet Take 1 tablet by mouth daily.    . famotidine (PEPCID) 20 MG tablet Take 1 tablet (20 mg total) by mouth 2 (two) times daily. 180 tablet 1  . FLUoxetine (PROZAC) 40 MG  capsule Take 40 mg by mouth daily.    . pantoprazole (PROTONIX) 40 MG tablet Take 1 tablet (40 mg total) by mouth daily. 30 tablet 3  . Na Sulfate-K Sulfate-Mg Sulf (SUPREP BOWEL PREP KIT) 17.5-3.13-1.6 GM/177ML SOLN Take 1 kit by mouth as directed. For colonoscopy prep (Patient not taking: Reported on 11/14/2020) 354 mL 0  . ondansetron (ZOFRAN ODT) 4 MG disintegrating tablet Take 1 tablet (4 mg total) by mouth every 8 (eight) hours as needed for nausea. (Patient not taking: Reported on 11/14/2020) 10 tablet 0  . prochlorperazine (COMPAZINE) 10 MG tablet Take 1 tablet (10 mg total) by mouth every 8 (eight) hours as needed for nausea or vomiting. (Patient not taking: Reported on 11/14/2020) 30 tablet 0   No current facility-administered medications on file prior to visit.    No Known Allergies  Social History:  reports that she has never smoked. She has never used smokeless tobacco. She reports that she does not drink alcohol and does not use drugs.  Family History  Problem Relation Age of Onset  . Skin cancer Mother   . Colon polyps Mother   . Colon cancer Father 35  . Colon polyps Father   . Rectal cancer Father   . Hypertension Paternal Uncle   . Stroke Maternal Grandfather   . Esophageal cancer Neg Hx   .  Inflammatory bowel disease Neg Hx   . Liver disease Neg Hx   . Pancreatic cancer Neg Hx   . Stomach cancer Neg Hx     The following portions of the patient's history were reviewed and updated as appropriate: allergies, current medications, past family history, past medical history, past social history, past surgical history and problem list.  Review of Systems Pertinent items noted in HPI and remainder of comprehensive ROS otherwise negative.  Physical Exam:  BP 114/82   Pulse 78   Resp 16   Ht $R'5\' 9"'px$  (1.753 m)   Wt 125 lb (56.7 kg)   LMP 11/09/2020   BMI 18.46 kg/m  CONSTITUTIONAL: Well-developed, well-nourished female in no acute distress.  HENT:  Normocephalic,  atraumatic, External right and left ear normal.  EYES: Conjunctivae and EOM are normal. Pupils are equal, round, and reactive to light. No scleral icterus.  NECK: Normal range of motion, supple, no masses.  Normal thyroid.  SKIN: Skin is warm and dry. No rash noted. Not diaphoretic. No erythema. No pallor. MUSCULOSKELETAL: Normal range of motion. No tenderness.  No cyanosis, clubbing, or edema. NEUROLOGIC: Alert and oriented to person, place, and time. Normal reflexes, muscle tone coordination.  PSYCHIATRIC: Normal mood and affect. Normal behavior. Normal judgment and thought content. CARDIOVASCULAR: Normal heart rate noted, regular rhythm RESPIRATORY: Clear to auscultation bilaterally. Effort and breath sounds normal, no problems with respiration noted. BREASTS: Symmetric in size. No masses, tenderness, skin changes, nipple drainage, or lymphadenopathy bilaterally. Performed in the presence of a chaperone. ABDOMEN: Soft, no distention noted.  No tenderness, rebound or guarding.  PELVIC: deferred   Assessment and Plan:      1. Irregular periods    -Lengthy discussion of contraception options to control abnormal bleeding. Discussed option of IUD being most likely to shorten/lighten periods. Patient prefers pills and would like to try progesterone only option. Discussed dosing to help with bleeding and cyclical use to prevent pregnancy. Patient verbalized understanding and will trial use. Will follow up in 3 months or sooner if needed due to intolerance of method.  -Recommended pelvic ultrasound to rule out structural issues, patient agreeable.   Wende Mott, CNM 11/14/20 11:05 AM  Return in about 3 months (around 02/13/2021).

## 2020-11-14 NOTE — Patient Instructions (Signed)
Stop OCPs. When bleeding starts, take 2 pills/day until bleeding stops then once daily. Skip week of inactive pills to continue taking pills with medication continuously.

## 2020-11-20 ENCOUNTER — Ambulatory Visit (HOSPITAL_COMMUNITY): Payer: No Typology Code available for payment source

## 2020-11-20 LAB — OVA AND PARASITE EXAMINATION
CONCENTRATE RESULT:: NONE SEEN
MICRO NUMBER:: 11748621
SPECIMEN QUALITY:: ADEQUATE
TRICHROME RESULT:: NONE SEEN

## 2020-11-20 LAB — CLOSTRIDIUM DIFFICILE TOXIN B, QUALITATIVE, REAL-TIME PCR: Toxigenic C. Difficile by PCR: NOT DETECTED

## 2020-11-20 LAB — PANCREATIC ELASTASE, FECAL: Pancreatic Elastase-1, Stool: >500 mcg/g

## 2020-11-21 ENCOUNTER — Other Ambulatory Visit: Payer: No Typology Code available for payment source

## 2020-11-24 ENCOUNTER — Encounter: Payer: Self-pay | Admitting: Gastroenterology

## 2020-11-24 NOTE — Progress Notes (Signed)
January 2021 EGD done in [place] Dr. Lenis Noon  Esophagus normal.  Stomach showed minimal antral gastritis.  Biopsies were done in the body and antrum to rule out H. pylori.  Small bowel was entered to the second portion.  Biopsies were taken to rule out celiac sprue.  Stomach on retroflexion was normal.  On inspection of the antrum there were no significant gastric contraction but no retained food or fluid there was certainly no spasm.  Question of antral gastric motility disorder.  Minimal antral gastritis.  Apparent decreased antral contractions.  Consider erythromycin therapy. Pathology Small bowel biopsy showed focal mild fibrosis in the lamina propria. Stomach biopsy showed slight and focal mild chronic gastritis.  No intestinal metaplasia.   These notes will be scanned into the chart.  Corliss Parish, MD [facility] Advanced Endoscopy Office # [phone]

## 2020-11-27 ENCOUNTER — Encounter: Payer: Self-pay | Admitting: Medical-Surgical

## 2020-11-29 ENCOUNTER — Ambulatory Visit (HOSPITAL_COMMUNITY)
Admission: RE | Admit: 2020-11-29 | Discharge: 2020-11-29 | Disposition: A | Discharge status: Home / Self Care | Payer: No Typology Code available for payment source | Source: Ambulatory Visit

## 2020-11-29 ENCOUNTER — Other Ambulatory Visit: Payer: Self-pay

## 2020-11-29 ENCOUNTER — Ambulatory Visit (HOSPITAL_COMMUNITY)
Admission: RE | Admit: 2020-11-29 | Discharge: 2020-11-29 | Disposition: A | Discharge status: Home / Self Care | Payer: No Typology Code available for payment source | Source: Ambulatory Visit | Attending: Gastroenterology | Admitting: Gastroenterology

## 2020-11-29 DIAGNOSIS — R634 Abnormal weight loss: Secondary | ICD-10-CM | POA: Diagnosis present

## 2020-11-29 DIAGNOSIS — N926 Irregular menstruation, unspecified: Secondary | ICD-10-CM | POA: Insufficient documentation

## 2020-11-29 DIAGNOSIS — K5900 Constipation, unspecified: Secondary | ICD-10-CM

## 2020-11-29 DIAGNOSIS — R197 Diarrhea, unspecified: Secondary | ICD-10-CM | POA: Diagnosis present

## 2020-11-29 DIAGNOSIS — R198 Other specified symptoms and signs involving the digestive system and abdomen: Secondary | ICD-10-CM

## 2020-12-04 ENCOUNTER — Encounter: Payer: Self-pay | Admitting: Medical-Surgical

## 2020-12-04 MED ORDER — PANTOPRAZOLE SODIUM 40 MG PO TBEC: 40.0000 mg | DELAYED_RELEASE_TABLET | Freq: Every day | ORAL | 3 refills | Status: DC

## 2020-12-27 ENCOUNTER — Encounter: Payer: Self-pay | Admitting: Medical-Surgical

## 2021-01-03 ENCOUNTER — Encounter: Payer: Self-pay | Admitting: Gastroenterology

## 2021-01-09 ENCOUNTER — Ambulatory Visit (AMBULATORY_SURGERY_CENTER): Payer: No Typology Code available for payment source | Admitting: Gastroenterology

## 2021-01-09 ENCOUNTER — Encounter: Payer: Self-pay | Admitting: Gastroenterology

## 2021-01-09 ENCOUNTER — Other Ambulatory Visit: Payer: Self-pay

## 2021-01-09 VITALS — BP 103/70 | HR 72 | Temp 97.8°F | Resp 19 | Ht 68.0 in | Wt 126.0 lb

## 2021-01-09 DIAGNOSIS — K319 Disease of stomach and duodenum, unspecified: Secondary | ICD-10-CM | POA: Diagnosis not present

## 2021-01-09 DIAGNOSIS — R0789 Other chest pain: Secondary | ICD-10-CM | POA: Diagnosis not present

## 2021-01-09 DIAGNOSIS — K621 Rectal polyp: Secondary | ICD-10-CM

## 2021-01-09 DIAGNOSIS — R1084 Generalized abdominal pain: Secondary | ICD-10-CM | POA: Diagnosis not present

## 2021-01-09 DIAGNOSIS — R197 Diarrhea, unspecified: Secondary | ICD-10-CM | POA: Diagnosis present

## 2021-01-09 DIAGNOSIS — R12 Heartburn: Secondary | ICD-10-CM | POA: Diagnosis not present

## 2021-01-09 DIAGNOSIS — R634 Abnormal weight loss: Secondary | ICD-10-CM | POA: Diagnosis not present

## 2021-01-09 DIAGNOSIS — R630 Anorexia: Secondary | ICD-10-CM

## 2021-01-09 DIAGNOSIS — R198 Other specified symptoms and signs involving the digestive system and abdomen: Secondary | ICD-10-CM

## 2021-01-09 DIAGNOSIS — R1114 Bilious vomiting: Secondary | ICD-10-CM

## 2021-01-09 DIAGNOSIS — K5900 Constipation, unspecified: Secondary | ICD-10-CM

## 2021-01-09 HISTORY — PX: UPPER GASTROINTESTINAL ENDOSCOPY: SHX188

## 2021-01-09 HISTORY — PX: COLONOSCOPY: SHX174

## 2021-01-09 MED ORDER — SODIUM CHLORIDE 0.9 % IV SOLN: 500.0000 mL | Freq: Once | INTRAVENOUS | Status: DC

## 2021-01-09 NOTE — Op Note (Signed)
Jaime Bauer Patient Name: Jaime Bauer Procedure Date: 01/09/2021 2:20 PM MRN: [id number] Endoscopist: Justice Britain , MD Age: 20 Referring MD:  Date of Birth: 2001/05/04 Gender: Female Account #: [id number] Procedure:                Colonoscopy Indications:              Generalized abdominal pain, Clinically significant                            diarrhea of unexplained origin, Exclusion of                            ulcerative colitis, Weight loss Medicines:                Monitored Anesthesia Care Procedure:                Pre-Anesthesia Assessment:                           - Prior to the procedure, a History and Physical                            was performed, and patient medications and                            allergies were reviewed. The patient's tolerance of                            previous anesthesia was also reviewed. The risks                            and benefits of the procedure and the sedation                            options and risks were discussed with the patient.                            All questions were answered, and informed consent                            was obtained. Prior Anticoagulants: The patient has                            taken no previous anticoagulant or antiplatelet                            agents. ASA Grade Assessment: II - A patient with                            mild systemic disease. After reviewing the risks                            and benefits, the patient was deemed in  satisfactory condition to undergo the procedure.                           After obtaining informed consent, the colonoscope                            was passed under direct vision. Throughout the                            procedure, the patient's blood pressure, pulse, and                            oxygen saturations were monitored continuously. The                            Olympus PCF-H190DL  ([id number]) Colonoscope was                            introduced through the anus and advanced to the 10                            cm into the ileum. The colonoscopy was performed                            without difficulty. The patient tolerated the                            procedure. The quality of the bowel preparation was                            good. The terminal ileum, ileocecal valve,                            appendiceal orifice, and rectum were photographed. Scope In: 2:50:05 PM Scope Out: 3:06:15 PM Scope Withdrawal Time: 0 hours 13 minutes 15 seconds  Total Procedure Duration: 0 hours 16 minutes 10 seconds  Findings:                 The digital rectal exam findings include                            hemorrhoids. Pertinent negatives include no                            palpable rectal lesions.                           The terminal ileum and ileocecal valve appeared                            normal. Biopsies were taken with a cold forceps for                            histology.  Normal mucosa was found in the entire colon.                            Biopsies were taken with a cold forceps for                            histology.                           Non-bleeding non-thrombosed internal hemorrhoids                            were found during retroflexion, during perianal                            exam and during digital exam. The hemorrhoids were                            Grade II (internal hemorrhoids that prolapse but                            reduce spontaneously). Complications:            No immediate complications. Estimated Blood Loss:     Estimated blood loss was minimal. Impression:               - Hemorrhoids found on digital rectal exam.                           - The examined portion of the ileum was normal.                            Biopsied.                           - Normal mucosa in the entire examined colon.                             Biopsied.                           - Non-bleeding non-thrombosed internal hemorrhoids. Recommendation:           - The patient will be observed post-procedure,                            until all discharge criteria are met.                           - Discharge patient to home.                           - Patient has a contact number available for                            emergencies. The signs and symptoms of potential  delayed complications were discussed with the                            patient. Return to normal activities tomorrow.                            Written discharge instructions were provided to the                            patient.                           - High fiber diet.                           - Use FiberCon 1-2 tablets PO daily.                           - Consider SIBO breath testing.                           - Consider Cholestyramine use in future for                            diarrhea.                           - The findings and recommendations were discussed                            with the patient.                           - The findings and recommendations were discussed                            with the patient's family. Justice Britain, MD 01/09/2021 3:19:20 PM

## 2021-01-09 NOTE — Patient Instructions (Signed)
YOU HAD AN ENDOSCOPIC PROCEDURE TODAY AT [facility]:   Refer to the procedure report that was given to you for any specific questions about what was found during the examination.  If the procedure report does not answer your questions, please call your gastroenterologist to clarify.  If you requested that your care partner not be given the details of your procedure findings, then the procedure report has been included in a sealed envelope for you to review at your convenience later.  YOU SHOULD EXPECT: Some feelings of bloating in the abdomen. Passage of more gas than usual.  Walking can help get rid of the air that was put into your GI tract during the procedure and reduce the bloating. If you had a lower endoscopy (such as a colonoscopy or flexible sigmoidoscopy) you may notice spotting of blood in your stool or on the toilet paper. If you underwent a bowel prep for your procedure, you may not have a normal bowel movement for a few days.  Please Note:  You might notice some irritation and congestion in your nose or some drainage.  This is from the oxygen used during your procedure.  There is no need for concern and it should clear up in a day or so.  SYMPTOMS TO REPORT IMMEDIATELY:   Following lower endoscopy (colonoscopy or flexible sigmoidoscopy):  Excessive amounts of blood in the stool  Significant tenderness or worsening of abdominal pains  Swelling of the abdomen that is new, acute  Fever of 100F or higher   Following upper endoscopy (EGD)  Vomiting of blood or coffee ground material  New chest pain or pain under the shoulder blades  Painful or persistently difficult swallowing  New shortness of breath  Fever of 100F or higher  Black, tarry-looking stools  For urgent or emergent issues, a gastroenterologist can be reached at any hour by calling [phone]. Do not use MyChart messaging for urgent concerns.    DIET:  We do recommend a small meal at first, but  then you may proceed to your regular diet.  Drink plenty of fluids but you should avoid alcoholic beverages for 24 hours.  ACTIVITY:  You should plan to take it easy for the rest of today and you should NOT DRIVE or use heavy machinery until tomorrow (because of the sedation medicines used during the test).    FOLLOW UP: Our staff will call the number listed on your records 48-72 hours following your procedure to check on you and address any questions or concerns that you may have regarding the information given to you following your procedure. If we do not reach you, we will leave a message.  We will attempt to reach you two times.  During this call, we will ask if you have developed any symptoms of COVID 19. If you develop any symptoms (ie: fever, flu-like symptoms, shortness of breath, cough etc.) before then, please call [phone].  If you test positive for Covid 19 in the 2 weeks post procedure, please call and report this information to [place].    If any biopsies were taken you will be contacted by phone or by letter within the next 1-3 weeks.  Please call us at [phone] if you have not heard about the biopsies in 3 weeks.    SIGNATURES/CONFIDENTIALITY: You and/or your care partner have signed paperwork which will be entered into your electronic medical record.  These signatures attest to the fact that that the information above on  your After Visit Summary has been reviewed and is understood.  Full responsibility of the confidentiality of this discharge information lies with you and/or your care-partner.   RECOMMEND HIGH FIBER DIET. USE FIBER-CON 1-2 TABLETS DAILY. INFORMATION GIVEN ON HIGH FIBER , HEMORRHOIDS AND HIATAL HERNIA.

## 2021-01-09 NOTE — Progress Notes (Signed)
Pt's states no medical or surgical changes since previsit or office visit.  ° °Vitals CW °

## 2021-01-09 NOTE — Progress Notes (Signed)
Called to room to assist during endoscopic procedure.  Patient ID and intended procedure confirmed with present staff. Received instructions for my participation in the procedure from the performing physician.  

## 2021-01-09 NOTE — Progress Notes (Signed)
A/ox3, pleased with MAC, report to RN 

## 2021-01-09 NOTE — Op Note (Signed)
[facility] Patient Name: Jaime Bauer Procedure Date: 01/09/2021 2:27 PM MRN: [id number] Endoscopist: Corliss Parish , MD Age: 20 Referring MD:  Date of Birth: February 29, 2001 Gender: Female Account #: [id number] Procedure:                Upper GI endoscopy Indications:              Generalized abdominal pain, Heartburn, Unexplained                            chest pain, Diarrhea, Weight loss Medicines:                Monitored Anesthesia Care Procedure:                Pre-Anesthesia Assessment:                           - Prior to the procedure, a History and Physical                            was performed, and patient medications and                            allergies were reviewed. The patient's tolerance of                            previous anesthesia was also reviewed. The risks                            and benefits of the procedure and the sedation                            options and risks were discussed with the patient.                            All questions were answered, and informed consent                            was obtained. Prior Anticoagulants: The patient has                            taken no previous anticoagulant or antiplatelet                            agents. ASA Grade Assessment: II - A patient with                            mild systemic disease. After reviewing the risks                            and benefits, the patient was deemed in                            satisfactory condition to undergo the procedure.  After obtaining informed consent, the endoscope was                            passed under direct vision. Throughout the                            procedure, the patient's blood pressure, pulse, and                            oxygen saturations were monitored continuously. The                            Endoscope was introduced through the mouth, and                            advanced to the  second part of duodenum. The upper                            GI endoscopy was accomplished without difficulty.                            The patient tolerated the procedure. Scope In: Scope Out: Findings:                 No gross lesions were noted in the entire                            esophagus. Biopsies were taken with a cold forceps                            for histology to rule out EoE/LoE.                           The Z-line was irregular and was found 36 cm from                            the incisors.                           Patchy mildly erythematous mucosa without bleeding                            was found in the gastric antrum.                           No other gross lesions were noted in the entire                            examined stomach. Biopsies were taken with a cold                            forceps for histology and Helicobacter pylori  testing.                           No gross lesions were noted in the duodenal bulb,                            in the first portion of the duodenum and in the                            second portion of the duodenum. Biopsies were taken                            with a cold forceps for histology to rule out                            enteropathies. Complications:            No immediate complications. Estimated Blood Loss:     Estimated blood loss was minimal. Impression:               - No gross lesions in esophagus. Biopsied.                           - Z-line irregular, 36 cm from the incisors.                           - Erythematous mucosa in the antrum. No other gross                            lesions in the stomach. Biopsied.                           - No gross lesions in the duodenal bulb, in the                            first portion of the duodenum and in the second                            portion of the duodenum. Biopsied. Recommendation:           - Proceed to scheduled  colonoscopy.                           - Continue present medications.                           - Await pathology results.                           - The findings and recommendations were discussed                            with the patient.                           - The findings and recommendations were discussed  with the patient's family. Corliss Parish, MD 01/09/2021 3:11:58 PM

## 2021-01-11 ENCOUNTER — Telehealth: Payer: Self-pay | Admitting: *Deleted

## 2021-01-11 ENCOUNTER — Telehealth: Payer: Self-pay

## 2021-01-11 NOTE — Telephone Encounter (Signed)
  Follow up Call-  Call back number 01/09/2021  Post procedure Call Back phone  # [phone]  Permission to leave phone message Yes     Patient questions:  Do you have a fever, pain , or abdominal swelling? No. Pain Score  0 *  Have you tolerated food without any problems? Yes.    Have you been able to return to your normal activities? Yes.    Do you have any questions about your discharge instructions: Diet   No. Medications  No. Follow up visit  No.  Do you have questions or concerns about your Care? No.  Actions: * If pain score is 4 or above: No action needed, pain <4.

## 2021-01-11 NOTE — Telephone Encounter (Signed)
  Follow up Call-  Call back number 01/09/2021  Post procedure Call Back phone  # [phone]  Permission to leave phone message Yes     Patient questions:   Duplicate.

## 2021-01-11 NOTE — Telephone Encounter (Signed)
  Follow up Call-  Call back number 01/09/2021  Post procedure Call Back phone  # [phone]  Permission to leave phone message Yes     Patient questions:  Message left to call if necessary

## 2021-01-12 ENCOUNTER — Encounter: Payer: Self-pay | Admitting: Gastroenterology

## 2021-01-21 ENCOUNTER — Encounter: Payer: Self-pay | Admitting: Medical-Surgical

## 2021-01-29 ENCOUNTER — Other Ambulatory Visit: Payer: Self-pay | Admitting: *Deleted

## 2021-01-29 MED ORDER — NORETHINDRONE 0.35 MG PO TABS: 1.0000 | ORAL_TABLET | Freq: Every day | ORAL | 2 refills | Status: DC

## 2021-01-29 NOTE — Telephone Encounter (Signed)
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  She is requesting her RX be sent to [facility] in [place] until she returns in the fall.

## 2021-02-13 ENCOUNTER — Encounter: Payer: Self-pay | Admitting: Advanced Practice Midwife

## 2021-02-13 ENCOUNTER — Telehealth (INDEPENDENT_AMBULATORY_CARE_PROVIDER_SITE_OTHER): Payer: No Typology Code available for payment source | Admitting: Advanced Practice Midwife

## 2021-02-13 DIAGNOSIS — N939 Abnormal uterine and vaginal bleeding, unspecified: Secondary | ICD-10-CM | POA: Diagnosis not present

## 2021-02-13 MED ORDER — NORGESTIMATE-ETH ESTRADIOL 0.25-35 MG-MCG PO TABS: 1.0000 | ORAL_TABLET | Freq: Every day | ORAL | 11 refills | Status: DC

## 2021-02-13 NOTE — Progress Notes (Signed)
GYNECOLOGY VIRTUAL VISIT ENCOUNTER NOTE  Provider location: Center for [facility] [place]   Patient location: Home  I connected with Jaime Bauer on 02/13/21 at  9:30 AM EDT by MyChart Video Encounter and verified that I am speaking with the correct person using two identifiers.   I discussed the limitations, risks, security and privacy concerns of performing an evaluation and management service virtually and the availability of in person appointments. I also discussed with the patient that there may be a patient responsible charge related to this service. The patient expressed understanding and agreed to proceed.   History:  Jaime Bauer is a 20 y.o. G0P0000 female being evaluated today for abnormal uterine bleeding and breakthrough bleeding on OCPs/POPs. She was switched from OCPs to POPs 11/2020 due to continued irregular bleeding and headaches with OCPs.  She reports frequent bleeding lasting 3-4 weeks out of each month and associated cramping/pain. She reports some headaches, but improved from before the switch to POPs.       Past Medical History:  Diagnosis Date   Anxiety    Concussion    Depression    History of multiple concussions    Syncope    Past Surgical History:  Procedure Laterality Date   COLONOSCOPY  01/09/2021   NO PAST SURGERIES     UPPER GASTROINTESTINAL ENDOSCOPY  01/09/2021   2020- [facility]   The following portions of the patient's history were reviewed and updated as appropriate: allergies, current medications, past family history, past medical history, past social history, past surgical history and problem list.   Health Maintenance:  No Pap or mammogram hx due to young age.  Review of Systems:  Pertinent items noted in HPI and remainder of comprehensive ROS otherwise negative.  Physical Exam:   General:  Alert, oriented and cooperative. Patient appears to be in no acute distress.  Mental Status: Normal mood and affect. Normal  behavior. Normal judgment and thought content.   Respiratory: Normal respiratory effort, no problems with respiration noted  Rest of physical exam deferred due to type of encounter  Labs and Imaging No results found for this or any previous visit (from the past 336 hour(s)). No results found.     Assessment and Plan:     1. Abnormal uterine bleeding (AUB) --Pt with irregular bleeding on Yasmin, also with headaches. Switched 11/2020 to POPs. She continues to have frequent bleeding, lasting 3-4 weeks at a time and painful cramping with bleeding.   --History of OCPs for bleeding in high school that she tolerated well. She reports headaches with her periods without hormonal contraception.  It is indeterminate whether the OCPs caused headaches or the irregular cycles contributed to hormonal headaches.   --Discussed options with pt including Slynd, a higher dose POP with placebo pills to regulate cycles, returning to OCPs and trying either higher or lower estrogen doses, or hormonal IUD.   --Will try regular OCPs first, to regulate periods and see how headaches respond.  F/U planned in 3 months but if pt has headaches or other concerning side effects, she is to call/send MyChart message and Rx will be changed. - norgestimate-ethinyl estradiol (ORTHO-CYCLEN) 0.25-35 MG-MCG tablet; Take 1 tablet by mouth daily.  Dispense: 28 tablet; Refill: 11       I discussed the assessment and treatment plan with the patient. The patient was provided an opportunity to ask questions and all were answered. The patient agreed with the plan and demonstrated an understanding of the  instructions.   The patient was advised to call back or seek an in-person evaluation/go to the ED if the symptoms worsen or if the condition fails to improve as anticipated.  I provided 10 minutes of face-to-face time during this encounter.   Sharen Counter, CNM [facility] for [employer], [facility]

## 2021-03-07 ENCOUNTER — Other Ambulatory Visit: Payer: Self-pay

## 2021-03-07 ENCOUNTER — Ambulatory Visit: Payer: No Typology Code available for payment source | Admitting: Medical-Surgical

## 2021-03-07 ENCOUNTER — Encounter: Payer: Self-pay | Admitting: Medical-Surgical

## 2021-03-07 VITALS — BP 118/83 | HR 72 | Resp 20 | Wt 124.1 lb

## 2021-03-07 DIAGNOSIS — R112 Nausea with vomiting, unspecified: Secondary | ICD-10-CM | POA: Diagnosis not present

## 2021-03-07 DIAGNOSIS — E611 Iron deficiency: Secondary | ICD-10-CM

## 2021-03-07 DIAGNOSIS — K529 Noninfective gastroenteritis and colitis, unspecified: Secondary | ICD-10-CM

## 2021-03-07 DIAGNOSIS — R5383 Other fatigue: Secondary | ICD-10-CM | POA: Diagnosis not present

## 2021-03-07 MED ORDER — PROMETHAZINE HCL 12.5 MG PO TABS: 6.2500 mg | ORAL_TABLET | Freq: Three times a day (TID) | ORAL | 0 refills | Status: DC | PRN

## 2021-03-07 MED ORDER — PANTOPRAZOLE SODIUM 20 MG PO TBEC: 40.0000 mg | DELAYED_RELEASE_TABLET | Freq: Every day | ORAL | 1 refills | Status: DC

## 2021-03-07 NOTE — Progress Notes (Signed)
HPI with pertinent ROS:   CC: stomach issues, low iron, fatigue  HPI: Very pleasant 20 year old female presenting today for further evaluation of GI complaints and low iron.  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  She has been evaluated several times by several different gastroenterologists and so far, no answers have been found to the cause of her symptoms.  Lately, she has had progressive fatigue and it was found that her iron levels were very low.  She has been taking oral iron supplementation but has not noticed an improvement in her fatigue.  She is using Protonix 40 mg daily which does seem to help with reflux symptoms but she is experiencing significant nausea.  Reports being unable to eat anything with nutrients because it causes severe nausea with vomiting.  She is currently living on a diet that is mostly formed by breads and carbohydrates.  She has tried multiple protein shakes and supplements but is unable to tolerate them.  In June, she had an EGD as well as colonoscopy.  Biopsies were done but there were no abnormal findings to explain her symptoms.  She does have a follow-up with GI scheduled on 8/17.  Unfortunately, her symptoms are causing quite a bit of mental anxiety.  She is taking fluoxetine 60 mg daily and is followed by psychiatry.  Denies SI/HI.  Does admit that she is starting to feel helpless after going through so much and still having no answers.  I reviewed the past medical history, family history, social history, surgical history, and allergies today and no changes were needed.  Please see the problem list section below in epic for further details.   Physical exam:   General: Well Developed, well nourished, and in no acute distress.  Neuro: Alert and oriented x3.  HEENT: Normocephalic, atraumatic.  Skin: Warm and dry. Cardiac: Regular rate  and rhythm, no murmurs rubs or gallops, no lower extremity edema.  Respiratory: Clear to auscultation bilaterally. Not using accessory muscles, speaking in full sentences.  Impression and Recommendations:    1. Low iron 2. Fatigue, unspecified type Although she has not been on oral supplementation for a full 3 months, we will and recheck CBC with differential as well as iron panel today to see if she is responding at all to oral replacement.  If not, recommend getting her in with hematology for consideration of IV iron infusions. - CBC with Differential/Platelet - Fe+TIBC+Fer  3. Nausea and vomiting in adult 4. Frequent stools Strongly encouraged follow-up with GI on 8/17 as scheduled.  Unfortunately, she has been through quite a bit and we still have no answers.  She has tried multiple medications with no real benefit.  Her symptoms do appear to be cyclical.  She does have issues with diarrhea after being constipated for several days.  This can certainly explain some of her nausea and intolerance of foods.  After reviewing information with patient as well as what we have in the records, it does not look like she is tried hyoscyamine or cholestyramine.  Not sure if either of these would provide benefit but there are certainly options.  There is some evidence to suggest IBgard may be beneficial so recommended that she start this medication to see if it helps.  Since nausea can be a side effect of PPIs, reducing her Protonix to 20 mg daily since reflux is not as big of an issue anymore.  Sending in Phenergan 12.5 mg tabs with instructions to take one half of a tablet every 8 hours as needed for nausea.  Okay to continue using Zofran if desired.  Has not had specific testing for food allergies but is interested in doing so.  Our options are limited here in this office but GI may have more access to a full range of testing.  If not, advised her to let me know and we will get her in with an  allergist.  Return in about 6 weeks (around 04/18/2021) for GI issues/fatigue follow up. ___________________________________________ Thayer Ohm, DNP, APRN, FNP-BC Primary Care and Sports Medicine [facility] [place]

## 2021-03-08 LAB — CBC WITH DIFFERENTIAL/PLATELET
Absolute Monocytes: 357 cells/uL (ref 200–950)
Basophils Absolute: 30 cells/uL (ref 0–200)
Basophils Relative: 0.7 %
Eosinophils Absolute: 39 cells/uL (ref 15–500)
Eosinophils Relative: 0.9 %
HCT: 40.9 % (ref 35.0–45.0)
Hemoglobin: 13.2 g/dL (ref 11.7–15.5)
Lymphs Abs: 1273 cells/uL (ref 850–3900)
MCH: 29.1 pg (ref 27.0–33.0)
MCHC: 32.3 g/dL (ref 32.0–36.0)
MCV: 90.3 fL (ref 80.0–100.0)
MPV: 10.1 fL (ref 7.5–12.5)
Monocytes Relative: 8.3 %
Neutro Abs: 2602 cells/uL (ref 1500–7800)
Neutrophils Relative %: 60.5 %
Platelets: 295 10*3/uL (ref 140–400)
RBC: 4.53 10*6/uL (ref 3.80–5.10)
RDW: 11.7 % (ref 11.0–15.0)
Total Lymphocyte: 29.6 %
WBC: 4.3 10*3/uL (ref 3.8–10.8)

## 2021-03-08 LAB — IRON,TIBC AND FERRITIN PANEL
%SAT: 22 % (calc) (ref 16–45)
Ferritin: 18 ng/mL (ref 16–154)
Iron: 87 ug/dL (ref 40–190)
TIBC: 403 mcg/dL (calc) (ref 250–450)

## 2021-03-20 ENCOUNTER — Other Ambulatory Visit: Payer: Self-pay | Admitting: Medical-Surgical

## 2021-03-21 ENCOUNTER — Encounter: Payer: Self-pay | Admitting: Gastroenterology

## 2021-03-21 ENCOUNTER — Ambulatory Visit: Payer: No Typology Code available for payment source | Admitting: Gastroenterology

## 2021-03-21 ENCOUNTER — Other Ambulatory Visit (INDEPENDENT_AMBULATORY_CARE_PROVIDER_SITE_OTHER): Payer: No Typology Code available for payment source

## 2021-03-21 VITALS — BP 98/70 | HR 100 | Ht 68.0 in | Wt 119.5 lb

## 2021-03-21 DIAGNOSIS — K30 Functional dyspepsia: Secondary | ICD-10-CM

## 2021-03-21 DIAGNOSIS — K582 Mixed irritable bowel syndrome: Secondary | ICD-10-CM | POA: Diagnosis not present

## 2021-03-21 DIAGNOSIS — R634 Abnormal weight loss: Secondary | ICD-10-CM | POA: Diagnosis not present

## 2021-03-21 DIAGNOSIS — R110 Nausea: Secondary | ICD-10-CM

## 2021-03-21 DIAGNOSIS — R198 Other specified symptoms and signs involving the digestive system and abdomen: Secondary | ICD-10-CM

## 2021-03-21 LAB — CORTISOL: Cortisol, Plasma: 13.5 ug/dL

## 2021-03-21 LAB — TSH: TSH: 1.73 u[IU]/mL (ref 0.35–5.50)

## 2021-03-21 MED ORDER — ONDANSETRON HCL 4 MG PO TABS: 4.0000 mg | ORAL_TABLET | Freq: Three times a day (TID) | ORAL | 0 refills | Status: DC | PRN

## 2021-03-21 NOTE — Progress Notes (Signed)
GASTROENTEROLOGY OUTPATIENT CLINIC VISIT   Primary Care Provider Christen Butter, NP [address] [place] [address] [phone]  Patient Profile: Jaime Bauer is a 20 y.o. female with a pmh significant for family history colorectal cancer (father at age 74), anxiety/depression, concussions.  The patient presents to the [facility] for an evaluation and management of problem(s) noted below:  Problem List 1. Irritable bowel syndrome with both constipation and diarrhea   2. Nausea without vomiting   3. Upset stomach   4. Unintentional weight loss   5. Irregular bowel habits     History of Present Illness Please see initial consultation note for full details of HPI.    Interval History The patient returns for a scheduled follow-up.  She has returned from [place] to begin school once again at [employer].  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  She is continuing her prerequisites for premedicine as she continues to have a goal of going to medical school.  The patient had undergone an upper and lower endoscopy this summer before going back home to [place].  Results are as below but did not show any evidence of inflammatory bowel disease or other disorders.  SIBO breath testing was considered but she did not have this done before she left to [place].  Patient returns saying that at this point she is dealing with more issues of constipation rather than diarrhea currently.  She will experience 2 to 3 days of not having a bowel movement and then have a significant amount of bowel movement for 1 day and then alternate back to not having a bowel movement for 2 to 3 days.  No blood in her stools.  She continues to have nausea.  As result of her nausea and upset stomach she is not eating.  She has not been able to follow a low FODMAP diet because she is just not eating as much.  Weight has decreased.  She feels her stress and anxiety are not  well controlled even on recently uptitrated medications.  She follows with a telehealth psychiatrist for which she will get [place] the information of how to contact.  Patient just wants to feel better.  She has antiemetics but is scared to take them because she does not want to get sleepy.  She had issues with oral dissolving tablet Zofran caused her to have headaches that she has not been able to take that.  She has recently been initiated on IBgard and she feels that this may regulate her bowels slightly more.  She has not been on fiber supplementation on a regular basis.  She has never been on Linzess/Amitiza/Trulance/Motegrity.  GI Review of Systems Positive as above including bloating Negative for odynophagia, dysphagia, hematochezia, melena   Review of Systems General: Denies fevers/chills Cardiovascular: Denies current chest pain/palpitations Pulmonary: Denies shortness of breath Gastroenterological: See HPI Genitourinary: Denies darkened urine Hematological: Denies easy bruising/bleeding Dermatological: Denies jaundice Psychological: Mood is stable   Medications Current Outpatient Medications  Medication Sig Dispense Refill   FLUoxetine (PROZAC) 20 MG capsule Take 60 mg by mouth every morning.     ondansetron (ZOFRAN) 4 MG tablet Take 1 tablet (4 mg total) by mouth every 8 (eight) hours as needed for nausea or vomiting. 20 tablet 0   pantoprazole (PROTONIX) 20 MG tablet Take 2 tablets (40 mg total) by mouth daily. 90 tablet 1   promethazine (PHENERGAN) 12.5 MG tablet Take 0.5 tablets (6.25 mg  total) by mouth every 8 (eight) hours as needed for nausea or vomiting. 20 tablet 0   No current facility-administered medications for this visit.    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  Tobacco Use   Smoking status: Never   Smokeless tobacco: Never  Vaping Use   Vaping Use: Never used  Substance and Sexual Activity   Alcohol use: Never   Drug use: Never   Sexual activity: Yes    Birth control/protection: Pill  Other Topics Concern   Not on file  Social History Narrative   Right handed   Drinks caffeine   Two story home   Social Determinants of Health   Financial Resource Strain: Not on file  Food Insecurity: Not on file  Transportation Needs: Not on file  Physical Activity: Not on file  Stress: Not on file  Social Connections: Not on file  Intimate Partner Violence: Not on file   Family History  Problem Relation Age of Onset   Skin cancer Mother    Colon polyps Mother    Colon cancer Father 80   Colon polyps Father    Rectal cancer Father    Hypertension Paternal Uncle    Stroke Maternal Grandfather    Esophageal cancer Neg Hx    Inflammatory bowel disease Neg Hx    Liver disease Neg Hx    Pancreatic cancer Neg Hx    Stomach cancer Neg Hx    I have reviewed her medical, social, and family history in detail and updated the electronic medical record as necessary.    PHYSICAL EXAMINATION  BP 98/70 (BP Location: Left Arm, Patient Position: Sitting, Cuff Size: Normal)   Pulse 100   Ht 5\' 8"  (1.727 m)   Wt 119 lb 8 oz (54.2 kg)   LMP 02/20/2021   BMI 18.17 kg/m  Wt Readings from Last 3 Encounters:  03/21/21 119 lb 8 oz (54.2 kg)  03/07/21 124 lb 1.6 oz (56.3 kg)  01/09/21 126 lb (57.2 kg)  GEN: NAD, appears stated age, doesn't appear chronically ill PSYCH: Cooperative, without pressured speech EYE:  Conjunctivae pink, sclerae anicteric ENT: MMM CV: Nontachycardic RESP: No audible wheezing GI: NABS, soft, no rebound  MSK/EXT: No lower extremity edema SKIN: No jaundice NEURO:  Alert & Oriented x 3, no focal deficits   REVIEW OF DATA  I reviewed the following data at the time of this encounter:  GI Procedures and Studies  June 2022 EGD - No gross lesions in esophagus. Biopsied. - Z-line irregular, 36 cm from the incisors. - Erythematous mucosa in the antrum. No other gross lesions in the stomach. Biopsied. - No gross lesions in the duodenal bulb, in the first portion of the duodenum and in the second portion of the duodenum. Biopsied.  June 2022 colonoscopy - Hemorrhoids found on digital rectal exam. - The  examined portion of the ileum was normal. Biopsied. - Normal mucosa in the entire examined colon. Biopsied. - Non-bleeding non-thrombosed internal hemorrhoids.  Pathology Diagnosis 1. Surgical [P], duodenal biopsies - BENIGN DUODENAL MUCOSA - NO ACUTE INFLAMMATION, VILLOUS BLUNTING OR INCREASED INTRAEPITHELIAL LYMPHOCYTES IDENTIFIED 2. Surgical [P], random gastric biopsies (antral erythema) - BENIGN GASTRIC MUCOSA WITH MILD REACTIVE CHANGES - NO H. PYLORI, INTESTINAL METAPLASIA OR MALIGNANCY IDENTIFIED 3. Surgical [P], esophageal biopsies - BENIGN SQUAMOUS MUCOSA - NO INCREASED INTRAEPITHELIAL EOSINOPHILS 4. Surgical [P], small bowel, terminal ileum - BENIGN SMALL BOWEL MUCOSA WITH LYMPHOID AGGREGATES - NO ACUTE INFLAMMATION, GRANULOMAS OR MALIGNANCY IDENTIFIED 5. Surgical [P], colon nos, random colon biopsies - BENIGN COLONIC MUCOSA - NO ACTIVE INFLAMMATION OR EVIDENCE OF MICROSCOPIC COLITIS - NO HIGH GRADE DYSPLASIA OR MALIGNANCY IDENTIFIED Microscopic Comment 2. Warthin-Starry stain is NEGATIVE for organisms morphologically consistent with Helicobacter pylori.    Laboratory Studies  No relevant studies to review  Imaging Studies  No relevant studies to  review   ASSESSMENT  Ms. Keefe is a 20 y.o. female with a pmh significant for family history colorectal cancer (father at age 21), anxiety/depression, concussions.  The patient is seen today for evaluation and management of:  1. Irritable bowel syndrome with both constipation and diarrhea   2. Nausea without vomiting   3. Upset stomach   4. Unintentional weight loss   5. Irregular bowel habits    The patient remains hemodynamically stable.  Clinically however she continues to have significant issues with some ongoing unintentional weight loss.  Etiology remains unclear although we have effectively ruled many potentially significant issues.  We are going to try to optimize bowel habits and see how she does.  She may require cross-sectional CT imaging in the near future.  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  I am not able to initiate a TCA at this time due to her already being on SSRI therapy but she will get us the information about her psychiatrist so that we can forward our note to him so that we can discuss potential changes in therapy that could be effective for her such as the query/use of Remeron for appetite stimulation and antiemetic effect as well as potentially from a mental health perspective.  She will continue IBgard for now.  We will optimize her fiber intake.  We will check some laboratories for her persistent nausea.  We will trial Zofran tablets rather than ODT to see if this may be helpful.  She will update us in a few weeks to let us know where things stand.  If issues persist and weight loss persists then cross-sectional imaging will need to be performed.  All patient questions were answered to the best of my ability, and the patient agrees to the aforementioned plan of action with follow-up as indicated.   PLAN  Laboratories as outlined  below Initiate Benefiber or Metamucil once to twice daily Continue IBgard 1 to 2 capsules twice daily (samples given but we may be able to help with in the future any additional samples depending on what she can afford) Trial Zofran 4 mg nondissolving tablets as needed nausea during day and Phenergan at night prior to going to sleep SIBO breath testing to be considered If weight loss is persisting will need cross-sectional CT abdomen/pelvis to be performed   Orders Placed  This Encounter  Procedures   TSH   Cortisol     New Prescriptions   ONDANSETRON (ZOFRAN) 4 MG TABLET    Take 1 tablet (4 mg total) by mouth every 8 (eight) hours as needed for nausea or vomiting.   Modified Medications   No medications on file    Planned Follow Up No follow-ups on file.   Total Time in Face-to-Face and in Coordination of Care for patient including independent/personal interpretation/review of prior testing, medical history, examination, medication adjustment, communicating results with the patient directly, and documentation with the EHR is 45 minutes.   Corliss Parish, MD [facility] Office # [phone]

## 2021-03-21 NOTE — Patient Instructions (Addendum)
Your provider has requested that you go to the basement level for lab work before leaving today. Press "B" on the elevator. The lab is located at the first door on the left as you exit the elevator.  Due to recent changes in healthcare laws, you may see the results of your imaging and laboratory studies on MyChart before your provider has had a chance to review them.  We understand that in some cases there may be results that are confusing or concerning to you. Not all laboratory results come back in the same time frame and the provider may be waiting for multiple results in order to interpret others.  Please give [place] 48 hours in order for your provider to thoroughly review all the results before contacting the office for clarification of your results.   Send mychart message in 3 weeks to let us know how your doing.   Take IBgard 1-2 capsules twice daily. ( Samples given today). You may purchase IBgard over the counter.  We have sent the following medications to your pharmacy for you to pick up at your convenience: Zofran   Soluble Benefiber 1-2 times once daily for 1 week, then increase to twice daily.   Please keep follow up on: 05/10/21  @ 3:10pm  Thank you for choosing me and [facility].  Dr. Meridee Score

## 2021-03-24 ENCOUNTER — Encounter: Payer: Self-pay | Admitting: Gastroenterology

## 2021-03-24 DIAGNOSIS — K30 Functional dyspepsia: Secondary | ICD-10-CM | POA: Insufficient documentation

## 2021-03-24 DIAGNOSIS — R110 Nausea: Secondary | ICD-10-CM | POA: Insufficient documentation

## 2021-03-24 DIAGNOSIS — K582 Mixed irritable bowel syndrome: Secondary | ICD-10-CM | POA: Insufficient documentation

## 2021-03-29 ENCOUNTER — Encounter: Payer: Self-pay | Admitting: Medical-Surgical

## 2021-04-18 ENCOUNTER — Telehealth: Payer: Self-pay

## 2021-04-18 DIAGNOSIS — R109 Unspecified abdominal pain: Secondary | ICD-10-CM

## 2021-04-18 DIAGNOSIS — R634 Abnormal weight loss: Secondary | ICD-10-CM

## 2021-04-18 NOTE — Telephone Encounter (Signed)
Mansouraty, Netty Starring., MD  You 2 hours ago (5:49 AM)   Jaime Bauer, With the patient's persistent pain and weight loss she now needs additional workup and imaging. She needs a CTAP with IV/PO contrast. She will need to give a pregnancy test or know that she will need one prior to the CT scan as per radiology recommendations. She may also require a CCK-HIDA. With this being said, let's move forward with the CT scan. I would also like for you to get the information to contact the patient's psychiatrist office, as I think she needs to likely be started on Elavil, but with her other medications, I cannot start that safely. Thanks. GM      Note    You routed conversation to Mansouraty, Netty Starring., MD 2 days ago   Jaime Bauer, Jaime  [facility] Clinical Pool (supporting Mansouraty, Netty Starring., MD) 2 days ago   Hey,   For the past two days my stomach has been hurting and uncomfortable to a point where I cannot function. There's nothing I can seem to do to make it feel even a little better. I'm still in a lot of pain and extremely uncomfortable now and I can't get any relief. I cannot even watch tv to distract myself at this point. I've been taking my IBGard twice daily as well as my fiber at night and my pantozoprole in the morning. I'm also continuing to lose weight even though I'm eating the same amount consistently. I'm always tired and fatigued and nauseous and just I don't know what to do anymore. I was wondering what I could do at this point because I feel extremely stuck in a very bad place in terms of my health.

## 2021-04-18 NOTE — Telephone Encounter (Signed)
Jaime Bauer, With the patient's persistent pain and weight loss she now needs additional workup and imaging. She needs a CTAP with IV/PO contrast. She will need to give a pregnancy test or know that she will need one prior to the CT scan as per radiology recommendations. She may also require a CCK-HIDA. With this being said, let's move forward with the CT scan. I would also like for you to get the information to contact the patient's psychiatrist office, as I think she needs to likely be started on Elavil, but with her other medications, I cannot start that safely. Thanks. GM

## 2021-04-18 NOTE — Telephone Encounter (Signed)
The order has been entered and sent to the schedulers.  The pt has also been advised and will call the schedulers if she has not heard from that office in 1 week.  I have asked for the pt to provide the name and number of the Psychiatrist she sees.

## 2021-04-24 ENCOUNTER — Other Ambulatory Visit: Payer: Self-pay

## 2021-04-24 ENCOUNTER — Ambulatory Visit (HOSPITAL_COMMUNITY)
Admission: RE | Admit: 2021-04-24 | Discharge: 2021-04-24 | Disposition: A | Discharge status: Home / Self Care | Payer: No Typology Code available for payment source | Source: Ambulatory Visit | Attending: Gastroenterology | Admitting: Gastroenterology

## 2021-04-24 DIAGNOSIS — R109 Unspecified abdominal pain: Secondary | ICD-10-CM | POA: Diagnosis present

## 2021-04-24 DIAGNOSIS — R634 Abnormal weight loss: Secondary | ICD-10-CM | POA: Diagnosis not present

## 2021-04-24 MED ORDER — IOHEXOL 350 MG/ML SOLN
80.0000 mL | Freq: Once | INTRAVENOUS | Status: AC | PRN
  Administered 2021-04-24: 80 mL via INTRAVENOUS

## 2021-05-01 ENCOUNTER — Other Ambulatory Visit (HOSPITAL_COMMUNITY)
Admission: RE | Admit: 2021-05-01 | Discharge: 2021-05-01 | Disposition: A | Discharge status: Home / Self Care | Payer: No Typology Code available for payment source | Source: Ambulatory Visit

## 2021-05-01 ENCOUNTER — Other Ambulatory Visit: Payer: Self-pay

## 2021-05-01 ENCOUNTER — Ambulatory Visit (INDEPENDENT_AMBULATORY_CARE_PROVIDER_SITE_OTHER): Payer: No Typology Code available for payment source

## 2021-05-01 VITALS — BP 104/70 | HR 82 | Ht 69.0 in | Wt 122.0 lb

## 2021-05-01 DIAGNOSIS — R102 Pelvic and perineal pain unspecified side: Secondary | ICD-10-CM

## 2021-05-01 DIAGNOSIS — Z124 Encounter for screening for malignant neoplasm of cervix: Secondary | ICD-10-CM

## 2021-05-01 NOTE — Progress Notes (Signed)
   GYNECOLOGY PROGRESS NOTE  History:  20 y.o. G0P0000 presents to [facility] office today for problem gyn visit. She reports painful periods for the past year. She has tried both OCP's and POP's previously, which she stopped taking approximately 3-4 months ago due to nausea and headaches. The pain from her periods "messes with her anxiety" and often worsens GI symptoms. She reports her periods are also "very heavy", having to change her pad ever 1-2 hours on the heaviest days. She reports that she will take Advil once daily while on her periods to help relieve the pain, which usually helps.  She was recently evaluated by GI and PCP, had an abdominal CT which was normal. She is currently sexually active with one female partner. She denies h/a, dizziness, shortness of breath, n/v, or fever/chills.    The following portions of the patient's history were reviewed and updated as appropriate: allergies, current medications, past family history, past medical history, past social history, past surgical history and problem list. Last pap smear on never done due to age.  Health Maintenance Due  Topic Date Due   CHLAMYDIA SCREENING  Never done   Hepatitis C Screening  Never done   HPV VACCINES (2 - 3-dose series) 03/31/2019   COVID-19 Vaccine (3 - Booster for Janssen series) 11/17/2020   INFLUENZA VACCINE  03/05/2021   PAP-Cervical Cytology Screening  04/29/2021   PAP SMEAR-Modifier  04/29/2021     Review of Systems:  Pertinent items are noted in HPI.   Objective:  Physical Exam Blood pressure 104/70, pulse 82, height 5\' 9"  (1.753 m), weight 122 lb (55.3 kg), last menstrual period 04/27/2021. VS reviewed, nursing note reviewed,  Constitutional: well developed, well nourished, no distress HEENT: normocephalic CV: normal rate Pulm/chest wall: normal effort Breast Exam: deferred Abdomen: soft Neuro: alert and oriented x 3 Skin: warm, dry Psych: affect normal Pelvic exam: Cervix pink,  visually closed, without lesion, scant blood, vaginal walls and external genitalia normal; pap obtained Bimanual exam: Cervix 0/long/high, firm, anterior, neg CMT, uterus nontender, nonenlarged, adnexa without tenderness, enlargement, or mass  Assessment & Plan:  1. Screening for cervical cancer - Pap done today  - Cytology - PAP( [facility])  2. Pelvic pain - Discussed at length with patient that hormonal IUD may most likely help periods given she has tried both COC's and POP's previously. Patient wants to think about this option more before committing to IUD.  - Also recommend try a course of NSAIDs ~2 days before onset of periods and continue to take during period as a way to relieve pain during period - Will order pelvic ultrasound to rule out structural anomalies, patient agreeable - Patient to follow up for IUD insertion if she chooses this option at any time, but otherwise will see patient back in 3 months or sooner as needed   - 04/29/2021 Pelvis Complete; Future    [place], CNM 05/01/21 8:12 AM

## 2021-05-02 ENCOUNTER — Ambulatory Visit (INDEPENDENT_AMBULATORY_CARE_PROVIDER_SITE_OTHER): Payer: No Typology Code available for payment source

## 2021-05-02 DIAGNOSIS — R102 Pelvic and perineal pain: Secondary | ICD-10-CM | POA: Diagnosis not present

## 2021-05-02 LAB — CYTOLOGY - PAP: Diagnosis: NEGATIVE

## 2021-05-10 ENCOUNTER — Encounter: Payer: Self-pay | Admitting: Gastroenterology

## 2021-05-10 ENCOUNTER — Ambulatory Visit (INDEPENDENT_AMBULATORY_CARE_PROVIDER_SITE_OTHER): Payer: No Typology Code available for payment source | Admitting: Gastroenterology

## 2021-05-10 VITALS — BP 90/60 | HR 82 | Ht 69.0 in | Wt 120.4 lb

## 2021-05-10 DIAGNOSIS — R140 Abdominal distension (gaseous): Secondary | ICD-10-CM | POA: Diagnosis not present

## 2021-05-10 DIAGNOSIS — R1084 Generalized abdominal pain: Secondary | ICD-10-CM

## 2021-05-10 DIAGNOSIS — R110 Nausea: Secondary | ICD-10-CM

## 2021-05-10 DIAGNOSIS — K30 Functional dyspepsia: Secondary | ICD-10-CM | POA: Diagnosis not present

## 2021-05-10 DIAGNOSIS — K581 Irritable bowel syndrome with constipation: Secondary | ICD-10-CM

## 2021-05-10 DIAGNOSIS — K582 Mixed irritable bowel syndrome: Secondary | ICD-10-CM | POA: Diagnosis not present

## 2021-05-10 DIAGNOSIS — R634 Abnormal weight loss: Secondary | ICD-10-CM

## 2021-05-10 NOTE — Progress Notes (Signed)
GASTROENTEROLOGY OUTPATIENT CLINIC VISIT   Primary Care Provider Christen Butter, NP [address] [place] [address] [phone]  Patient Profile: Jaime Bauer is a 20 y.o. female with a pmh significant for family history colorectal cancer (father at age 61), anxiety/depression, concussions.  The patient presents to the [facility] for an evaluation and management of problem(s) noted below:  Problem List 1. Bloating   2. Irritable bowel syndrome with both constipation and diarrhea   3. Upset stomach   4. Nausea without vomiting   5. Generalized abdominal pain   6. Unintentional weight loss      History of Present Illness Please see initial consultation note for full details of HPI.    Interval History The patient returns for follow-up.  She is doing well in school per her report.  We have been in the process of trying to obtain access to the patient's psychiatry records and are going to get release of information forms today.  Patient underwent cross-sectional imaging due to her persistent symptoms and that cross-sectional imaging was grossly unremarkable.  Currently, she is dealing with more constipation and having only 3-4 bowel movements a week.  She has not had a strong bowel movement in a few days.  No blood in her stools has been noted.  She continues to have postprandial abdominal bloating but has bloating throughout the day as well.  Nausea persists at times but she is not vomiting.  We have not pursued SIBO breath testing as of yet.  She is eating but wishes she could eat more.  She has been given approval and cannot drive at this time.  She has never been on Linzess/Amitiza/Trulance/Motegrity.  Her weight has overall been stable.  GI Review of Systems Positive as above including infrequent early satiety Negative for dysphagia, odynophagia, melena, hematochezia   Review of Systems General: Denies fevers/chills/unintentional weight  loss Cardiovascular: Denies current chest pain/palpitations Pulmonary: Denies shortness of breath Gastroenterological: See HPI Genitourinary: Denies darkened urine Hematological: Denies easy bruising/bleeding Dermatological: Denies jaundice Psychological: Mood is stable but hopeful to get better   Medications Current Outpatient Medications  Medication Sig Dispense Refill   FLUoxetine (PROZAC) 20 MG capsule Take 60 mg by mouth every morning.     ondansetron (ZOFRAN) 4 MG tablet Take 1 tablet (4 mg total) by mouth every 8 (eight) hours as needed for nausea or vomiting. 20 tablet 0   pantoprazole (PROTONIX) 20 MG tablet Take 2 tablets (40 mg total) by mouth daily. (Patient taking differently: Take 40 mg by mouth daily. Taking 1 tablet) 90 tablet 1   promethazine (PHENERGAN) 12.5 MG tablet Take 0.5 tablets (6.25 mg total) by mouth every 8 (eight) hours as needed for nausea or vomiting. 20 tablet 0   No current facility-administered medications for this visit.    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  Tobacco Use   Smoking status: Never   Smokeless tobacco: Never  Vaping Use   Vaping Use: Never used  Substance and Sexual Activity   Alcohol use: Never   Drug use: Never   Sexual activity: Yes    Birth control/protection: None  Other Topics Concern   Not on file  Social History Narrative   Right handed   Drinks caffeine   Two story home   Social  Determinants of Health   Financial Resource Strain: Not on file  Food Insecurity: Not on file  Transportation Needs: Not on file  Physical Activity: Not on file  Stress: Not on file  Social Connections: Not on file  Intimate Partner Violence: Not on file   Family History  Problem Relation Age of Onset   Skin cancer Mother    Colon polyps Mother    Colon cancer Father 68   Colon polyps Father    Rectal cancer Father    Hypertension Paternal Uncle    Stroke Maternal Grandfather    Esophageal cancer Neg Hx    Inflammatory bowel disease Neg Hx    Liver disease Neg Hx    Pancreatic cancer Neg Hx    Stomach cancer Neg Hx    I have reviewed her medical, social, and family history in detail and updated the electronic medical record as necessary.    PHYSICAL EXAMINATION  BP 90/60   Pulse 82   Ht 5\' 9"  (1.753 m)   Wt 120 lb 6 oz (54.6 kg)   LMP 04/27/2021   BMI 17.78 kg/m  Wt Readings from Last 3 Encounters:  05/10/21 120 lb 6 oz (54.6 kg)  05/01/21 122 lb (55.3 kg)  03/21/21 119 lb 8 oz (54.2 kg)  GEN: NAD, appears stated age, doesn't appear chronically ill PSYCH: Cooperative, without pressured speech EYE: Conjunctivae pink, sclerae anicteric ENT: MMM CV: Nontachycardic RESP: No audible wheezing GI: NABS, soft, no rebound  MSK/EXT: No lower extremity edema SKIN: No jaundice NEURO:  Alert & Oriented x 3, no focal deficits   REVIEW OF DATA  I reviewed the following data at the time of this encounter:  GI Procedures and Studies  Previously reviewed  Laboratory Studies  No relevant studies to review  Imaging Studies  September 2022 CT abdomen pelvis with contrast IMPRESSION: No CT findings in the abdomen or pelvis to explain weight loss or pain.   ASSESSMENT  Ms. Spindler is a 20 y.o. female with a pmh significant for family history colorectal cancer (father at age 75), anxiety/depression, concussions.  The patient is seen today for evaluation and management  of:  1. Bloating   2. Irritable bowel syndrome with both constipation and diarrhea   3. Upset stomach   4. Nausea without vomiting   5. Generalized abdominal pain   6. Unintentional weight loss    The patient is hemodynamically stable.  Clinically no significant changes have occurred but she continues to be experiencing multiple GI symptoms that are most consistent with a likely functional bowel disorder such as IBS C/D.  Currently she is dealing with more constipation and we are going to work on that by initiating her on Linzess samples in an effort of trying to get her cleaned out and see if that helps with her bloating.  She does describe more issues of postprandial bloating so I am going to transition the patient from IBgard to FDgard in an effort of trying to see if that may be  helpful.  It is not clear that IBgard has been significantly helpful for her overall as she has been on it longer at this time (taking 2 capsules twice daily).  We are going to try to get an opportunity to try and touch base with the patient's telehealth psychiatrist to see whether the patient may benefit from an addition of a TCA for neuromodulation versus transition of her fluoxetine to medication such as Remeron that may increase weight gain by appetite increasing.  We can continue to think about a gastric emptying study if her symptoms of nausea persist.  SIBO breath testing versus empiric treatment with rifaximin was to be considered in the future.  She will continue the antiemetics as needed.  All patient questions were answered to the best of my ability, and the patient agrees to the aforementioned plan of action with follow-up as indicated.   PLAN  Has stopped fiber as it caused increased amounts of bloating and discomfort Transition IBgard to FDgard over next few weeks -IBgard 2 capsules daily x1 week and then FDgard 2 capsules daily thereafter (samples given to patient) Antiemetics as needed SIBO breath testing  versus empiric treatment in future Linzess samples offered to patient (72 mcg nightly) Patient to update [place] with fax number that we can send her release of information form to (she is signing 1 today) Consideration of whether patient may benefit from a TCA add on versus transition to Remeron or other appetite stimulating anxiolytic - Happy to try and discussed with patient's psychiatrist if that will be helpful as well If patient continues to have symptoms and we are not able to solve her issues we will consider further evaluation with one of my partners versus a quaternary center   No orders of the defined types were placed in this encounter.    New Prescriptions   No medications on file   Modified Medications   No medications on file    Planned Follow Up No follow-ups on file.   Total Time in Face-to-Face and in Coordination of Care for patient including independent/personal interpretation/review of prior testing, medical history, examination, medication adjustment, communicating results with the patient directly, and documentation with the EHR is 45 minutes.   Corliss Parish, MD [facility] Office # [phone]

## 2021-05-10 NOTE — Patient Instructions (Addendum)
Continue Ibgard through the end of this week.   Over the next 2 weeks Ibgard 2 capsules daily, then  stop and change to Fdgard 2 capsules once daily.   Samples of Linzess - once daily (given to you today).  Ask Dr Rana Snare-   If GI can consider a medication like Amitriptyline for use as a neuromodulator for IBS and pain or a medication called Remeron.   Please keep follow up on : 06/15/21 @ 2:50pm   If you are age 21 or younger, your body mass index should be between 19-25. Your Body mass index is 17.78 kg/m. If this is out of the aformentioned range listed, please consider follow up with your Primary Care Provider.   __________________________________________________________  The [facility] providers would like to encourage you to use [facility] to communicate with providers for non-urgent requests or questions.  Due to long hold times on the telephone, sending your provider a message by [facility] may be a faster and more efficient way to get a response.  Please allow 48 business hours for a response.  Please remember that this is for non-urgent requests.   Thank you for choosing me and [facility].  Dr. Meridee Score

## 2021-05-11 ENCOUNTER — Encounter: Payer: Self-pay | Admitting: Gastroenterology

## 2021-05-11 DIAGNOSIS — R140 Abdominal distension (gaseous): Secondary | ICD-10-CM | POA: Insufficient documentation

## 2021-05-11 DIAGNOSIS — R1084 Generalized abdominal pain: Secondary | ICD-10-CM | POA: Insufficient documentation

## 2021-05-22 ENCOUNTER — Ambulatory Visit (INDEPENDENT_AMBULATORY_CARE_PROVIDER_SITE_OTHER): Payer: No Typology Code available for payment source | Admitting: Obstetrics and Gynecology

## 2021-05-22 ENCOUNTER — Encounter: Payer: Self-pay | Admitting: Obstetrics and Gynecology

## 2021-05-22 ENCOUNTER — Other Ambulatory Visit: Payer: Self-pay

## 2021-05-22 VITALS — BP 107/68 | HR 76 | Wt 120.0 lb

## 2021-05-22 DIAGNOSIS — F32A Depression, unspecified: Secondary | ICD-10-CM | POA: Diagnosis not present

## 2021-05-22 DIAGNOSIS — N939 Abnormal uterine and vaginal bleeding, unspecified: Secondary | ICD-10-CM

## 2021-05-22 DIAGNOSIS — N946 Dysmenorrhea, unspecified: Secondary | ICD-10-CM

## 2021-05-22 MED ORDER — TRAMADOL HCL 50 MG PO TABS: 50.0000 mg | ORAL_TABLET | Freq: Four times a day (QID) | ORAL | 0 refills | Status: DC | PRN

## 2021-05-22 MED ORDER — NORETHINDRONE ACETATE 5 MG PO TABS: 5.0000 mg | ORAL_TABLET | Freq: Every day | ORAL | 1 refills | Status: DC

## 2021-05-22 NOTE — Progress Notes (Signed)
GYNECOLOGY ENCOUNTER NOTE  History:     Jaime Bauer is a 20 y.o. G0P0000 female here reporting concerns with painful periods. She has had many appointments in the office to discuss options. She is resistant to many of the options that have been offered to her because of side effects.  She has a period every month. Her last period was Sept 23 2022. Her period lasted 5-6 days which is a normal cycle for her. Her period is at it's heaviest during the 2nd and 3rd day. She was told by her last CNM visit to start taking an NSAID a few days before her period; she did this and felt that it helped only minimally.  She was seen by her neurologist recently and was told to stop ALL NSAIDs because this "could worsen her migraines."   Reports trying several different BC pills and reports extreme nausea. Reports another brand made her migaines worse.   She reports anemia with periods; in review of past labs there is no evidence of anemia. She is taking iron supplements daily.   Obstetric History OB History  Gravida Para Term Preterm AB Living  0 0 0 0 0 0  SAB IAB Ectopic Multiple Live Births  0 0 0 0 0    Past Medical History:  Diagnosis Date   Anxiety    Concussion    Depression    History of multiple concussions    Syncope     Past Surgical History:  Procedure Laterality Date   COLONOSCOPY  01/09/2021   NO PAST SURGERIES     UPPER GASTROINTESTINAL ENDOSCOPY  01/09/2021   2020- Fl keys    Current Outpatient Medications on File Prior to Visit  Medication Sig Dispense Refill   baclofen (LIORESAL) 10 MG tablet Take 10 mg by mouth 3 (three) times daily.     FLUoxetine (PROZAC) 20 MG capsule Take 60 mg by mouth every morning.     ondansetron (ZOFRAN) 4 MG tablet Take 1 tablet (4 mg total) by mouth every 8 (eight) hours as needed for nausea or vomiting. 20 tablet 0   pantoprazole (PROTONIX) 20 MG tablet Take 2 tablets (40 mg total) by mouth daily. (Patient taking differently: Take 40 mg  by mouth daily. Taking 1 tablet) 90 tablet 1   promethazine (PHENERGAN) 12.5 MG tablet Take 0.5 tablets (6.25 mg total) by mouth every 8 (eight) hours as needed for nausea or vomiting. 20 tablet 0   zonisamide (ZONEGRAN) 100 MG capsule Take 100 mg by mouth daily.     No current facility-administered medications on file prior to visit.    No Known Allergies  Social History:  reports that she has never smoked. She has never used smokeless tobacco. She reports that she does not drink alcohol and does not use drugs.  Family History  Problem Relation Age of Onset   Skin cancer Mother    Colon polyps Mother    Colon cancer Father 1   Colon polyps Father    Rectal cancer Father    Hypertension Paternal Uncle    Stroke Maternal Grandfather    Esophageal cancer Neg Hx    Inflammatory bowel disease Neg Hx    Liver disease Neg Hx    Pancreatic cancer Neg Hx    Stomach cancer Neg Hx     The following portions of the patient's history were reviewed and updated as appropriate: allergies, current medications, past family history, past medical history, past social history, past surgical  history and problem list.  Review of Systems Pertinent items noted in HPI and remainder of comprehensive ROS otherwise negative.  Physical Exam:  BP 107/68   Pulse 76   Wt 120 lb (54.4 kg)   LMP 04/27/2021   BMI 17.72 kg/m  CONSTITUTIONAL: Well-developed, well-nourished female in no acute distress.  HENT:  Normocephalic EYES: Conjunctivae and EOM are normal. SKIN: Skin is warm and dry.  NEUROLOGIC: Alert and oriented to person, place, and time. PSYCHIATRIC: Depressed mood.    Assessment and Plan:   A:  1. Abnormal uterine bleeding (AUB)  -She had a normal pelvic US recently. -Reviewed all recommended options for dysmenorrhea/ AUB including POPS, OCP's, IUD, Depo.  -Depo not a great option given her history of depression with past suicidal ideation. She is very hesitant about an IUD given the  stories she has heard. She has tried many options of OCP's and did not like the side effects. She is agreeable to trial Aygestin. I will provider her with a short course of tramadol for pain. She should alternate tylenol every 6/8 hours.   2. Painful menstrual periods; concerning for endometriosis.    3. Depression, unspecified depression type  It would be helpful to have her psych records transferred to our office.    Roann Merk, Harolyn Rutherford, NP [facility] [employer], [facility]

## 2021-05-23 DIAGNOSIS — N946 Dysmenorrhea, unspecified: Secondary | ICD-10-CM | POA: Insufficient documentation

## 2021-05-31 NOTE — Progress Notes (Signed)
  HPI with pertinent ROS:   CC: Discussed recent diagnoses and review meds  HPI: Pleasant 20 year old female presenting today to discuss recent visits with her specialists as well as review medications.  She has been under the care of of GYN for a while now and has been trying multiple birth control pills to help with menstrual type pains.  Unfortunately she has been unable to tolerate these due to nausea and worsening migraines.  She stopped taking birth control over the summer but when she followed up recently, they were discussing starting her on a different 1.  She is very hesitant about this because of her previous episodes of side effects.  She was previously taking NSAIDs for her.  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  She was prescribed tramadol by her OB/GYN but admits that she has not started this because of fear of drowsiness.  She has been talking to her psychiatrist as well who prescribed Remeron for her to help with GI issues as well as increased her appetite.  She has not started this yet and reports that she did take this in the past and had difficulty waking up.  She is working with a nutritionist on her dietary intake but has not been able to find a counselor due to wait list.  Taking fluoxetine 60 mg daily as prescribed by her psychiatrist and is working closely with them to find a good regimen for her.  Admits that her mental health is getting worse as they move along in this diagnostic odyssey and is aware that her mental health does play a role in her overall physical symptoms.  Denies SI/HI.  I reviewed the past medical history, family history, social history, surgical history, and allergies today and no changes were needed.  Please see the problem list section below in epic for further details.   Physical exam:   General: Well Developed, well nourished, and in no acute  distress.  Neuro: Alert and oriented x3.  HEENT: Normocephalic, atraumatic.  Skin: Warm and dry. Cardiac: Regular rate and rhythm, no murmurs rubs or gallops, no lower extremity edema.  Respiratory: Clear to auscultation bilaterally. Not using accessory muscles, speaking in full sentences.  Impression and Recommendations:    1. Medication management Continue current medications. Start Remeron as prescribed. Ok to use Tramadol as prescribed for menstrual pain. Establish with new OB/GYN as scheduled.   2. Anxiety 3. Depression, unspecified depression type 4. Obsessive-compulsive disorder, unspecified type Referring to behavioral health for counseling.   Return if symptoms worsen or fail to improve. ___________________________________________ Thayer Ohm, DNP, APRN, FNP-BC Primary Care and Sports Medicine [facility]

## 2021-06-01 ENCOUNTER — Ambulatory Visit (INDEPENDENT_AMBULATORY_CARE_PROVIDER_SITE_OTHER): Payer: No Typology Code available for payment source | Admitting: Medical-Surgical

## 2021-06-01 ENCOUNTER — Encounter: Payer: Self-pay | Admitting: Medical-Surgical

## 2021-06-01 VITALS — BP 103/68 | HR 69 | Resp 20 | Ht 69.0 in | Wt 120.7 lb

## 2021-06-01 DIAGNOSIS — R198 Other specified symptoms and signs involving the digestive system and abdomen: Secondary | ICD-10-CM

## 2021-06-01 DIAGNOSIS — F429 Obsessive-compulsive disorder, unspecified: Secondary | ICD-10-CM | POA: Diagnosis not present

## 2021-06-01 DIAGNOSIS — Z79899 Other long term (current) drug therapy: Secondary | ICD-10-CM | POA: Diagnosis not present

## 2021-06-01 DIAGNOSIS — F419 Anxiety disorder, unspecified: Secondary | ICD-10-CM | POA: Diagnosis not present

## 2021-06-01 DIAGNOSIS — F32A Depression, unspecified: Secondary | ICD-10-CM | POA: Diagnosis not present

## 2021-06-01 DIAGNOSIS — K582 Mixed irritable bowel syndrome: Secondary | ICD-10-CM

## 2021-06-05 ENCOUNTER — Encounter: Payer: Self-pay | Admitting: *Deleted

## 2021-06-06 ENCOUNTER — Ambulatory Visit (INDEPENDENT_AMBULATORY_CARE_PROVIDER_SITE_OTHER): Payer: No Typology Code available for payment source | Admitting: Psychiatry

## 2021-06-06 ENCOUNTER — Encounter: Payer: Self-pay | Admitting: Psychiatry

## 2021-06-06 ENCOUNTER — Other Ambulatory Visit: Payer: Self-pay

## 2021-06-06 VITALS — BP 108/77 | HR 69 | Ht 69.0 in | Wt 121.2 lb

## 2021-06-06 DIAGNOSIS — G43109 Migraine with aura, not intractable, without status migrainosus: Secondary | ICD-10-CM | POA: Diagnosis not present

## 2021-06-06 MED ORDER — ELETRIPTAN HYDROBROMIDE 40 MG PO TABS: 40.0000 mg | ORAL_TABLET | ORAL | 0 refills | Status: DC | PRN

## 2021-06-06 NOTE — Patient Instructions (Addendum)
Can increase zonisamide by 25 mg every week up to 200 mg daily. Give the medication 8 weeks, if you are not noticing improvement or are having bothersome side effects at that time let me know and we can discuss alternate medication options.  Start Relpax 40 mg as needed. Take one pill at the onset of migraine. If headache recurs or does not fully resolve, you may take a second dose after 2 hours. Please avoid taking more than 2 days per week  Limit NSAIDS to 2 days per week to avoid rebound headaches

## 2021-06-06 NOTE — Progress Notes (Signed)
Referring:  Pincus Badder, DC [address] [place],  [place] [address]  PCP: Christen Butter, NP  Neurology was asked to evaluate Jaime Bauer, a 20 year old female for a chief complaint of headaches.  Our recommendations of care will be communicated by shared medical record.    CC:  headaches  HPI:  Medical co-morbidities: endometriosis  The patient presents for evaluation of headaches. She has had daily headaches for the past couple of years. Has near-constant 5/10 headaches daily with more severe migraines 5-6 times per month. Migraines are most frequent during her menstrual cycle. They are described as 8/10 throbbing pain with associated photophobia, phonophobia, and nausea.  She just started zonisamide one month ago for prevention. Is currently taking 75 mg daily with plan to increase to 100 mg daily. Has not noticed a difference in her headaches yet. It does cause dizziness so she takes it at night. Was also prescribed baclofen but hasn't taken it yet. She has been seeing a massage therapist to help with neck pain and tension.  Takes Tylenol as needed for her headaches which is ineffective. Advil works better than Tylenol for her but she tries to avoid taking NSAIDs.  Headache History: Onset: childhood Triggers: no Aura: scotoma Location: moves around Quality/Description: throbbing, pressure, sharp Severity: 5-6/10 for daily headache, 8/10 for migraines Associated Symptoms:  Photophobia: yes  Phonophobia: yes  Nausea: yes Vomiting: yes Worse with activity?: yes Duration of headaches: 2-3 days  Pregnancy planning/birth control: no  Headache days per month: 30 Headache free days per month: 0  Current Treatment: Abortive Tylenol   Preventative Zonisamide 100 mg daily  Prior Therapies                                 Fluoxetine 60 mg daily Zonisamide 100 mg daily Baclofen 10 mg TID Occipital nerve block Zofran - worsens headache Phenergan - knocks her  out compazine  Headache Risk Factors: Headache risk factors and/or co-morbidities (+) Neck Pain (+) Sleep Disorder - trouble sleeping due to headaches (+) History of Traumatic Brain Injury and/or Concussion - multiple concussions from soccer  LABS: 03/21/21 TSH, CBC wnl  IMAGING:  MRI brain 08/28/20: right choroid fissure cyst, otherwise unremarkable  Imaging independently reviewed on June 06, 2021   Current Outpatient Medications on File Prior to Visit  Medication Sig Dispense Refill   baclofen (LIORESAL) 10 MG tablet Take 10 mg by mouth 3 (three) times daily.     FLUoxetine (PROZAC) 20 MG capsule Take 60 mg by mouth every morning.     ondansetron (ZOFRAN) 4 MG tablet Take 1 tablet (4 mg total) by mouth every 8 (eight) hours as needed for nausea or vomiting. 20 tablet 0   pantoprazole (PROTONIX) 20 MG tablet Take 2 tablets (40 mg total) by mouth daily. (Patient taking differently: Take 40 mg by mouth daily. Taking 1 tablet) 90 tablet 1   promethazine (PHENERGAN) 12.5 MG tablet Take 0.5 tablets (6.25 mg total) by mouth every 8 (eight) hours as needed for nausea or vomiting. 20 tablet 0   traMADol (ULTRAM) 50 MG tablet Take 1 tablet (50 mg total) by mouth every 6 (six) hours as needed for moderate pain. 20 tablet 0   zonisamide (ZONEGRAN) 100 MG capsule Take 100 mg by mouth daily.     norethindrone (AYGESTIN) 5 MG tablet Take 1 tablet (5 mg total) by mouth daily. Increase to 7.5  mg daily after 2 weeks. Stay on this dose of 7.5 mg until your next visit. (Patient not taking: Reported on 06/06/2021) 90 tablet 1   No current facility-administered medications on file prior to visit.     Allergies: No Known Allergies  Family History: Migraine or other headaches in the family:  paternal uncle Aneurysms in a first degree relative:  no Brain tumors in the family:  no Other neurological illness in the family:   both grandfathers had strokes  Past Medical History: Past Medical History:   Diagnosis Date   Anxiety    Chronic headaches    Concussion    w/o LOC   Depression    History of multiple concussions    from soccer   Syncope     Past Surgical History Past Surgical History:  Procedure Laterality Date   COLONOSCOPY  01/09/2021   NO PAST SURGERIES     UPPER GASTROINTESTINAL ENDOSCOPY  01/09/2021   2020- [facility]    Social History: Social History   Tobacco Use   Smoking status: Never   Smokeless tobacco: Never  Vaping Use   Vaping Use: Never used  Substance Use Topics   Alcohol use: Never   Drug use: Never    ROS: Negative for fevers, chills. Positive for headaches, photophobia, nausea. All other systems reviewed and negative unless stated otherwise in HPI.   Physical Exam:   Vital Signs: BP 108/77   Pulse 69   Ht 5\' 9"  (1.753 m)   Wt 121 lb 3.2 oz (55 kg)   BMI 17.90 kg/m  GENERAL: well appearing,in no acute distress,alert SKIN:  Color, texture, turgor normal. No rashes or lesions HEAD:  Normocephalic/atraumatic. CV:  RRR RESP: Normal respiratory effort MSK: +tenderness to palpation bilateral occiput, neck, and shoulders  NEUROLOGICAL: Mental Status: Alert, oriented to person, place and time,Follows commands Cranial Nerves: PERRL,visual fields intact to confrontation,extraocular movements intact,facial sensation intact,no facial droop or ptosis,hearing intact to finger rub bilaterally,no dysarthria,palate elevate symmetrically,tongue protrudes midline,shoulder shrug intact and symmetric Motor: muscle strength 5/5 both upper and lower extremities,no drift, normal tone Reflexes: 2+ throughout Sensation: intact to light touch all 4 extremities Coordination: Finger-to- nose-finger intact bilaterally,Heel-to-shin intact bilaterally Gait: normal-based   IMPRESSION: 20 year old female who presents for evaluation of daily headaches. Her current headache presentation is consistent with chronic migraine with aura. She recently started  Zonisamide and is in the process of uptitrating the dose. Will continue this for now. Discussed alternative preventive options including CGRPs and Botox if she is unable to tolerate zonisamide. Will start Relpax for rescue. Counseled on limiting rescue medications to 2 days per week to avoid rebound headaches.  PLAN: -Preventive: Continue zonisamide, increase by 25 mg each week up to 200 mg daily -Rescue: Start Relpax 40 mg PRN -Next steps: consider neck PT, CGRP/qulipta, Botox   I spent a total of 37 minutes chart reviewing and counseling the patient. Headache education was done. Discussed treatment options including preventive and acute medications, natural supplements, and physical therapy. Discussed medication overuse headache and to limit use of acute treatments to no more than 2 days/week or 10 days/month. Discussed medication side effects, adverse reactions and drug interactions. Written educational materials and patient instructions outlining all of the above were given.  Follow-up: 3 months   36, MD 06/06/2021   9:03 AM

## 2021-06-15 ENCOUNTER — Encounter: Payer: Self-pay | Admitting: Gastroenterology

## 2021-06-15 ENCOUNTER — Ambulatory Visit (INDEPENDENT_AMBULATORY_CARE_PROVIDER_SITE_OTHER): Payer: No Typology Code available for payment source | Admitting: Gastroenterology

## 2021-06-15 VITALS — BP 90/60 | HR 68 | Ht 69.0 in | Wt 121.0 lb

## 2021-06-15 DIAGNOSIS — R1084 Generalized abdominal pain: Secondary | ICD-10-CM | POA: Diagnosis not present

## 2021-06-15 DIAGNOSIS — K582 Mixed irritable bowel syndrome: Secondary | ICD-10-CM | POA: Diagnosis not present

## 2021-06-15 DIAGNOSIS — K30 Functional dyspepsia: Secondary | ICD-10-CM

## 2021-06-15 DIAGNOSIS — R140 Abdominal distension (gaseous): Secondary | ICD-10-CM

## 2021-06-15 DIAGNOSIS — R630 Anorexia: Secondary | ICD-10-CM

## 2021-06-15 MED ORDER — RIFAXIMIN 550 MG PO TABS: 550.0000 mg | ORAL_TABLET | Freq: Two times a day (BID) | ORAL | 0 refills | Status: DC

## 2021-06-15 NOTE — Progress Notes (Signed)
GASTROENTEROLOGY OUTPATIENT CLINIC VISIT   Primary Care Provider Christen Butter, NP [address] [place] [address] [phone]  Patient Profile: Jaime Bauer is a 20 y.o. female with a pmh significant for family history colorectal cancer (father at age 80), anxiety/depression, concussions, likely IBS-D/C, Chronic abdominal pain, ?Endometriosis.  The patient presents to the [facility] for an evaluation and management of problem(s) noted below:  Problem List 1. Irritable bowel syndrome with both constipation and diarrhea   2. Generalized abdominal pain   3. Bloating   4. Upset stomach   5. Anorexia    History of Present Illness Please see initial consultation note for full details of HPI.    Interval History The patient returns for scheduled follow-up.  She continues to do well in school.  However she continues to have issues.  Her alternating bowel habits of diarrhea and constipation remain more constipation in the weeks prior so we utilize Linzess in an effort of trying to move her bowels more frequently.  This led to more significant onset diarrhea.  Bloating is not improved.  Pain continues to be an issue.  She recently has been evaluated by her PCP as well as a new gynecologist.  There is concern for potential endometriosis and the patient is going to be initiated on an IUD as well as medication for possible interstitial cystitis.  With the increased dose Linzess that she had between 4 and 6 bowel movements per day.  Even after stopping she had diarrhea for few days for then alternating back to constipation.  Her weight thankfully has been stable.  She likely will be getting an IUD in early December and then heading home for winter break.  She trialed Remeron for 1 evening but it caused her to have significant alteration in her mental state so she stopped that.  She remains just on fluoxetine.  Her psychiatrist has not wanted her to initiate  amitriptyline because of a concern that it would interact with another medication.  She has transitioned from IBgard to FDgard but has noticed no difference.  GI Review of Systems Positive as above including mild anorexia Negative for odynophagia, dysphagia, vomiting, melena, hematochezia  Review of Systems General: Denies fevers/chills/unintentional weight loss Cardiovascular: Denies current chest pain/palpitations Pulmonary: Denies shortness of breath Gastroenterological: See HPI Genitourinary: Denies darkened urine Hematological: Denies easy bruising/bleeding Dermatological: Denies jaundice Psychological: Mood is stable but hopeful to get better   Medications Current Outpatient Medications  Medication Sig Dispense Refill   baclofen (LIORESAL) 10 MG tablet Take 10 mg by mouth 3 (three) times daily.     eletriptan (RELPAX) 40 MG tablet Take 1 tablet (40 mg total) by mouth as needed for migraine or headache. May repeat in 2 hours if headache persists or recurs. 10 tablet 0   FLUoxetine (PROZAC) 20 MG capsule Take 60 mg by mouth every morning.     norethindrone (AYGESTIN) 5 MG tablet Take 1 tablet (5 mg total) by mouth daily. Increase to 7.5 mg daily after 2 weeks. Stay on this dose of 7.5 mg until your next visit. 90 tablet 1   ondansetron (ZOFRAN) 4 MG tablet Take 1 tablet (4 mg total) by mouth every 8 (eight) hours as needed for nausea or vomiting. 20 tablet 0   pantoprazole (PROTONIX) 20 MG tablet Take 2 tablets (40 mg total) by mouth daily. (Patient taking differently: Take 40 mg by mouth daily. Taking 1 tablet) 90 tablet 1   promethazine (  PHENERGAN) 12.5 MG tablet Take 0.5 tablets (6.25 mg total) by mouth every 8 (eight) hours as needed for nausea or vomiting. 20 tablet 0   rifaximin (XIFAXAN) 550 MG TABS tablet Take 1 tablet (550 mg total) by mouth 2 (two) times daily. 20 tablet 0   traMADol (ULTRAM) 50 MG tablet Take 1 tablet (50 mg total) by mouth every 6 (six) hours as needed for  moderate pain. 20 tablet 0   zonisamide (ZONEGRAN) 100 MG capsule Take 100 mg by mouth daily.     No current facility-administered medications for this visit.    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  Tobacco Use   Smoking status: Never   Smokeless tobacco: Never  Vaping Use   Vaping Use: Never used  Substance and Sexual Activity   Alcohol use: Never   Drug use: Never   Sexual activity: Yes    Birth control/protection: None  Other Topics Concern   Not on file  Social History Narrative   Right handed   Drinks caffeine   Two story home   Social Determinants of Health   Financial Resource Strain: Not on file  Food Insecurity: Not on file  Transportation Needs: Not on file  Physical Activity: Not on file  Stress: Not on file  Social Connections: Not on file  Intimate Partner Violence: Not on file   Family History  Problem Relation Age of Onset   Skin cancer Mother    Colon polyps Mother    Colon cancer Father 69   Colon polyps Father    Rectal cancer Father    Stroke Maternal Grandfather    Hypertension Paternal Uncle    Esophageal cancer Neg Hx    Inflammatory bowel disease Neg Hx    Liver disease Neg Hx    Pancreatic cancer Neg Hx    Stomach cancer Neg Hx    I have reviewed her medical, social, and family history in detail and updated the electronic medical record as necessary.     PHYSICAL EXAMINATION  BP 90/60   Pulse 68   Ht 5\' 9"  (1.753 m)   Wt 121 lb (54.9 kg)   BMI 17.87 kg/m  Wt Readings from Last 3 Encounters:  06/15/21 121 lb (54.9 kg)  06/06/21 121 lb 3.2 oz (55 kg)  06/01/21 120 lb 11.2 oz (54.7 kg)  GEN: NAD, appears stated age, doesn't appear chronically ill PSYCH: Cooperative, without pressured speech EYE: Conjunctivae pink, sclerae anicteric ENT: MMM CV: Nontachycardic RESP: No audible wheezing GI: NABS, soft, no rebound  MSK/EXT: No lower extremity edema SKIN: No jaundice NEURO:  Alert & Oriented x 3, no focal deficits   REVIEW OF DATA  I reviewed the following data at the time of this encounter:  GI Procedures and  Studies  Previously reviewed  Laboratory Studies  No relevant studies to review  Imaging Studies  No new imaging studies to review   ASSESSMENT  Ms. Mikami is a 20 y.o. female with a pmh significant for family history colorectal cancer (father at age 22), anxiety/depression, concussions.  The patient is seen today for evaluation and management of:  1. Irritable bowel syndrome with both constipation and diarrhea   2. Generalized abdominal pain   3. Bloating   4. Upset stomach   5. Anorexia    The patient remains hemodynamically stable.  Clinically she continues to exhibit multiple GI issues symptoms.  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  I am going to wean her off FDgard since she has not noted any further benefit from this.  We will stop this after a week of daily use.  She rarely has pyrosis symptoms and so we will likely try to stop her PPI at some point in the near future.  I think the next steps in her evaluation would be a potential SIBO breath test (unfortunately do not have availability due to national shortage) versus empiric treatment for IBS D/C.  We will proceed with rifaximin treatment x10 days and see if this helps with some of her symptoms.  She is now working with a  gynecologist who is going to plan an IUD placement and treatment for possible interstitial cystitis.  I hate to make too many other medication adjustments with this young woman.  Once my note is completed this will be able to be available for her psychiatrist to review.  I wonder if the patient may be able to tolerate addition of desipramine to her current regimen (may not be able to due to already having concern of amitriptyline use).  The other thought would be whether she could be a candidate for BuSpar known which both of these medications have been utilized in GI functional disorders for neuromodulation.  Certainly we will have to defer this to her psychiatrist to consider.  We will see how she does after her treatment.  She will continue the current Linzess dosing during her periods of constipation and to try to maintain bowel habits (she will be given samples again).  We may consider a longer standing prescription thereafter.  Time will tell.  All patient questions were answered to the best of my ability, and the patient agrees to the aforementioned plan of action with follow-up as indicated.   PLAN  Continue FDgard daily for 1 week and then transition off Antiemetics as needed Do not have access to SIBO breath testing Xifaxan 550 twice daily x10 days for empiric IBS-D treatment Will likely transition off PPI in the coming weeks since not clear it is having any benefit for her We will continue Linzess 145 mcg samples as needed Consideration of whether patient may benefit from a TCA add-on (desipramine) versus transition to BuSpar - Happy to try and discussed with patient's psychiatrist if that will be helpful as well If patient continues to have symptoms and we are not able to solve her issues we will consider further evaluation with one of my partners versus a quaternary center   No orders of the defined types were placed in this encounter.   New Prescriptions   RIFAXIMIN (XIFAXAN) 550 MG  TABS TABLET    Take 1 tablet (550 mg total) by mouth 2 (two) times daily.   Modified Medications   No medications on file  Planned Follow Up No follow-ups on file.   Total Time in Face-to-Face and in Coordination of Care for patient including independent/personal interpretation/review of prior testing, medical history, examination, medication adjustment, communicating results with the patient directly, and documentation with the EHR is 25 minutes.   Justice Britain, MD [facility] Office # [phone]

## 2021-06-15 NOTE — Patient Instructions (Addendum)
Continue Linzess  one daily for now.   Take Fdgard daily for 1 week then stop.   We have sent the following medications to your pharmacy for you to pick up at your convenience: Xifaxin ( sent to [facility] pharmacy.)   Talk with your Psychiatrist about considering wether we could add desipramine to your current regimen or consider changing to Buspirone.    Please keep follow -up on: 08/07/21 @ 2:10pm       If you are age 21 or younger, your body mass index should be between 19-25. Your Body mass index is 17.87 kg/m. If this is out of the aformentioned range listed, please consider follow up with your Primary Care Provider.   ________________________________________________________  The [facility] providers would like to encourage you to use [facility] to communicate with providers for non-urgent requests or questions.  Due to long hold times on the telephone, sending your provider a message by [facility] may be a faster and more efficient way to get a response.  Please allow 48 business hours for a response.  Please remember that this is for non-urgent requests.  _______________________________________________________  Thank you for choosing me and [facility].  Dr. Meridee Score

## 2021-06-16 ENCOUNTER — Encounter: Payer: Self-pay | Admitting: Gastroenterology

## 2021-06-21 ENCOUNTER — Other Ambulatory Visit: Payer: Self-pay

## 2021-06-21 MED ORDER — RIFAXIMIN 550 MG PO TABS: 550.0000 mg | ORAL_TABLET | Freq: Two times a day (BID) | ORAL | 0 refills | Status: DC

## 2021-06-21 NOTE — Progress Notes (Signed)
Prescription for Xifaxan was re-sent to [facility]. Per [facility] they were unable to fill prescription due to patients insurance plan's requirement. Per fax from [place] they did reach out to patient to make her aware of this.

## 2021-06-25 ENCOUNTER — Encounter: Payer: Self-pay | Admitting: Gastroenterology

## 2021-06-25 ENCOUNTER — Other Ambulatory Visit: Payer: Self-pay

## 2021-06-25 MED ORDER — AMOXICILLIN-POT CLAVULANATE 875-125 MG PO TABS: 1.0000 | ORAL_TABLET | Freq: Two times a day (BID) | ORAL | 0 refills | Status: AC

## 2021-06-25 NOTE — Telephone Encounter (Signed)
If we do not have Xifaxan samples, then we can trial Augmentin 875 mg twice daily x10 days.  Thanks. GM

## 2021-06-26 ENCOUNTER — Ambulatory Visit: Payer: No Typology Code available for payment source | Admitting: Obstetrics and Gynecology

## 2021-07-03 ENCOUNTER — Other Ambulatory Visit: Payer: Self-pay

## 2021-07-03 MED ORDER — SUCRALFATE 1 GM/10ML PO SUSP: 1.0000 g | ORAL | 1 refills | Status: DC

## 2021-07-03 NOTE — Telephone Encounter (Signed)
Jaime Bauer, Let us try to have her take Carafate elixir 2-4 times daily (1 g). If she can tolerate taking it at least twice a day that will help guide as to how she feels by taking it. We can try a different antibiotic but I would let her know that any antibiotic certainly has a risk of causing abdominal upset but unless we try were not sure and since she could not afford the Xifaxan this would be my next go to antibiotic to trial. Please let her give [place] an update as to how her weight is doing in the next week. Thanks. GM

## 2021-07-10 ENCOUNTER — Encounter: Payer: Self-pay | Admitting: Medical-Surgical

## 2021-07-11 NOTE — Telephone Encounter (Signed)
LVM for patient to call back to get this virtual visit scheduled. AM °

## 2021-07-13 ENCOUNTER — Other Ambulatory Visit: Payer: Self-pay | Admitting: Medical-Surgical

## 2021-07-13 ENCOUNTER — Encounter: Payer: Self-pay | Admitting: Medical-Surgical

## 2021-07-13 ENCOUNTER — Telehealth: Payer: No Typology Code available for payment source | Admitting: Medical-Surgical

## 2021-07-13 ENCOUNTER — Ambulatory Visit (INDEPENDENT_AMBULATORY_CARE_PROVIDER_SITE_OTHER): Payer: No Typology Code available for payment source

## 2021-07-13 VITALS — HR 120

## 2021-07-13 DIAGNOSIS — Z09 Encounter for follow-up examination after completed treatment for conditions other than malignant neoplasm: Secondary | ICD-10-CM | POA: Diagnosis not present

## 2021-07-13 DIAGNOSIS — R009 Unspecified abnormalities of heart beat: Secondary | ICD-10-CM

## 2021-07-13 DIAGNOSIS — R000 Tachycardia, unspecified: Secondary | ICD-10-CM

## 2021-07-13 DIAGNOSIS — R0602 Shortness of breath: Secondary | ICD-10-CM

## 2021-07-13 NOTE — Progress Notes (Signed)
130 heart rate while lying down, SHOB, hands feel tinging. Went to the ED, they told her it was anxiety. Pt states she "knows the difference." Still happening randomly, HR today is 120.   See pt MyChart message from 07/10/21

## 2021-07-13 NOTE — Progress Notes (Signed)
Virtual Visit via Video Note  I connected with Jaime Bauer on 07/13/21 at 10:30 AM EST by a video enabled telemedicine application and verified that I am speaking with the correct person using two identifiers.   I discussed the limitations of evaluation and management by telemedicine and the availability of in person appointments. The patient expressed understanding and agreed to proceed.  Patient location: home Provider locations: office  Subjective:    CC: Hospital follow up  HPI: Pleasant 20 year old presenting via MyChart video visit to discuss her recent ED visit and the associated findings. She has been experiencing periods of tachycardia accompanied by SOB and intermittent hand tingling recently. This has been happening several times a day and is not associated with activity, rest, or known anxiety triggers. She reports that this feels different from her regular anxiety symptoms. She was originally seen at the [facility] in [facility] but they sent her to the ED to get IV fluids because they said she was severely dehydrated. When she got to the ED, they drew labs and did an EKG before telling her that it was anxiety and to go home (per patient report). Her labs were unremarkable except for low lymphocytes and slightly elevated granulocytes. She has continued with the same symptoms since her ED visit. Has not found any alleviating factors. Notes that she has been working with psychiatry and they are managing her mental health needs. Is also working with a nutritionist who tells her she is in "crisis mode" and that if she does not start eating, she will need to be hospitalized. There has been some talk of a feeding tube for temporary support but her parents adamantly refused saying that she is not at that point yet. Patient reports that she is trying to eat and drink but her continued stomach issues limit her efforts. No other concerning symptoms noted.    Past medical history, Surgical history, Family  history not pertinant except as noted below, Social history, Allergies, and medications have been entered into the medical record, reviewed, and corrections made.   Review of Systems: See HPI for pertinent positives and negatives.   Objective:    General: Speaking clearly in complete sentences without any shortness of breath.  Alert and oriented x3, ill appearing.  Normal judgment. No apparent acute distress.  Impression and Recommendations:    1. Hospital discharge follow-up Records not available in [place] for review. Looked over labs that patient sent via MyChart with no areas of concern identified. Strongly suspect her symptoms are multifactorial with anxiety, malnutrition, and dehydration all playing a part. Discussed getting a long term monitor for further evaluation but unsure if we can do this while she is out of state. May need to wait until she returns in January for college classes. Recommend touching base with psychiatry to maximize anxiety management. Strongly recommend working with the nutritionist to increase daily fluids and nutrient consumption. Discussed the benefits of a temporary feeding tube. In my opinion, this would be a good step for her at this point.    2. Abnormal heart rate Recommend long term monitor. Patient would like to speak with her parents before deciding on this. She will let me know.   I discussed the assessment and treatment plan with the patient. The patient was provided an opportunity to ask questions and all were answered. The patient agreed with the plan and demonstrated an understanding of the instructions.   The patient was advised to call back or seek an in-person  evaluation if the symptoms worsen or if the condition fails to improve as anticipated.  25 minutes of non-face-to-face time was provided during this encounter.  Return in about 4 weeks (around 08/10/2021) for tachycardia follow up once back in [place].  Thayer Ohm, DNP, APRN,  FNP-BC [facility]

## 2021-07-13 NOTE — Progress Notes (Unsigned)
Enrolled for [employer] to mail a ZIO XT long term holter monitor to the patients [place] address on file.

## 2021-07-15 ENCOUNTER — Encounter: Payer: Self-pay | Admitting: Medical-Surgical

## 2021-07-19 DIAGNOSIS — R000 Tachycardia, unspecified: Secondary | ICD-10-CM | POA: Diagnosis not present

## 2021-07-19 DIAGNOSIS — R0602 Shortness of breath: Secondary | ICD-10-CM | POA: Diagnosis not present

## 2021-07-19 DIAGNOSIS — R009 Unspecified abnormalities of heart beat: Secondary | ICD-10-CM

## 2021-07-22 ENCOUNTER — Encounter: Payer: Self-pay | Admitting: Medical-Surgical

## 2021-07-26 ENCOUNTER — Encounter: Payer: Self-pay | Admitting: Medical-Surgical

## 2021-07-31 ENCOUNTER — Encounter: Payer: Self-pay | Admitting: Medical-Surgical

## 2021-07-31 NOTE — Telephone Encounter (Signed)
Would recommend she schedule with one of my partners if my schedule is full. They will be able to review records and if needed, can touch base with me in between patients.

## 2021-07-31 NOTE — Telephone Encounter (Signed)
Patient called in regards to scheduling an appointment for evaluation of message below. She stated she is in college and the best day for her would be on 08/09/2021, currently [facility] does not have any openings and patient was hesitant to schedule with anyone else. Please Advise.

## 2021-08-06 ENCOUNTER — Encounter: Payer: Self-pay | Admitting: Medical-Surgical

## 2021-08-07 ENCOUNTER — Ambulatory Visit: Payer: No Typology Code available for payment source | Admitting: Medical-Surgical

## 2021-08-07 ENCOUNTER — Encounter: Payer: Self-pay | Admitting: Medical-Surgical

## 2021-08-07 ENCOUNTER — Ambulatory Visit: Payer: No Typology Code available for payment source | Admitting: Gastroenterology

## 2021-08-07 ENCOUNTER — Other Ambulatory Visit: Payer: Self-pay

## 2021-08-07 VITALS — BP 116/78 | HR 98 | Resp 20 | Ht 69.0 in | Wt 116.0 lb

## 2021-08-07 DIAGNOSIS — F32A Depression, unspecified: Secondary | ICD-10-CM

## 2021-08-07 DIAGNOSIS — F429 Obsessive-compulsive disorder, unspecified: Secondary | ICD-10-CM

## 2021-08-07 DIAGNOSIS — F419 Anxiety disorder, unspecified: Secondary | ICD-10-CM

## 2021-08-07 NOTE — Progress Notes (Signed)
HPI with pertinent ROS:   CC: Mental health concerns  HPI: Pleasant 21 year old female accompanied by her mother presenting today to discuss some multiple issues.  She would like to discuss her nutritional status as she has not been eating and continues to lose weight.  Notes that she was started on mirtazapine and has been taking one half a tablet at night which she reports may have helped a little with her appetite but notes it does help her sleep.  Over the last couple of nights, she has not taken the medication to see how she would do without it.  She was previously following closely with a nutritionist but has not seen her since the nutritionist recommended a possible need for a feeding tube.  Her mom reports that she does not want to do the feeding tube because she feels it would reduce the patient's drive to eat normally.  Of note, her weight in February 2022, just under a year ago, was 143 pounds.  Today she is weighing in at 116 pounds. Currently seeing psychiatry but reports this is virtually through [facility]. Not currently doing counseling. Her mother is here for the next couple of weeks and reports that she will make food (or buy it) and take it to [facility] and she will eat some or all of it. If her mother doesn't do this, Jaime Bauer doesn't get up to get food on her own. She reports that food doesn't taste good and she is always nauseated and just doesn't want to eat. Feels that the anti-emetic medications are helping and has not thrown up recently. She reports that she would not be more likely to go get food from the kitchen if it didn't involve cooking or preparing it. Ate a bagel with cream cheese this morning but at times will go most of the day, sleeping late then not seeking food for at least several hours after waking. Continues to have difficulty with intermittent diarrhea and constipation. Admits that she knows that her mental health can play a role in her symptoms but she doesn't think that her  problem is caused by mental health problems. Denies SI/HI.   I reviewed the past medical history, family history, social history, surgical history, and allergies today and no changes were needed.  Please see the problem list section below in epic for further details.   Physical exam:   General: Well Developed, well nourished, and in no acute distress.  Neuro: Alert and oriented x3.  HEENT: Normocephalic, atraumatic.  Skin: Warm and dry. Cardiac: Regular rate and rhythm.  Respiratory:  Not using accessory muscles, speaking in full sentences.  Impression and Recommendations:    1. Depression, unspecified depression type 2. Obsessive-compulsive disorder, unspecified type 3. Anxiety In depth discussion held with patient and mother regarding presenting symptoms and possible causes. She has been evaluated by 3 GI providers with endoscopic evaluations as well as a recent colonoscopy. There have been no explanations to her symptoms and she has not responded favorably to any of the medication regimens that have been tried. At this point, I think it is reasonable to consider psychological influences to her GI issues. We have known mental health issues that do not appear to be well controlled on her current regimen of Fluoxetine. Since [facility] is virtual with little person to person interaction, would like to get her in with a different psychiatrist. Referral entered urgently due to her declining condition and poor nutritional status. Also referring to behavioral health for counseling. Candid  discussion held regarding food intake and the need to reset habits and rituals surrounding eating. Discussed working in small frequent meals with use of protein bars or shakes to increase caloric intake and boost protein. Waiting for feelings of hunger to strike before eating is not recommended since this has led to poor motivation to seek out meals. Recommend setting alarms and routines to incorporate meals in daily  activities. Ultimately, if progress is not made in her motivation to partake of nutrition on her own, she may decline to the point where hospitalization is necessary. Further decline may lead to discussion of a temporary feeding tube since this would allow for nutrition while investigating potential causes and developing plans. Her mother is resistant to this idea since she feels this would be more of a crutch that lets Jaime Bauer off the hook so she doesn't have to eat. We are not at that point yet but it's not outside the realm of future necessity.  - Ambulatory referral to Behavioral Health - Ambulatory referral to Psychiatry  Return in about 1 month (around 09/07/2021) for mood/GI follow up. ___________________________________________ Thayer Ohm, DNP, APRN, FNP-BC Primary Care and Sports Medicine [facility]

## 2021-08-13 ENCOUNTER — Encounter: Payer: Self-pay | Admitting: Medical-Surgical

## 2021-08-14 ENCOUNTER — Other Ambulatory Visit: Payer: Self-pay | Admitting: Medical-Surgical

## 2021-08-14 DIAGNOSIS — R110 Nausea: Secondary | ICD-10-CM

## 2021-08-14 DIAGNOSIS — R009 Unspecified abnormalities of heart beat: Secondary | ICD-10-CM

## 2021-08-14 DIAGNOSIS — R0602 Shortness of breath: Secondary | ICD-10-CM

## 2021-08-14 DIAGNOSIS — R0789 Other chest pain: Secondary | ICD-10-CM

## 2021-08-15 ENCOUNTER — Ambulatory Visit: Payer: No Typology Code available for payment source | Admitting: Medical-Surgical

## 2021-08-23 ENCOUNTER — Encounter: Payer: Self-pay | Admitting: Medical-Surgical

## 2021-08-27 ENCOUNTER — Other Ambulatory Visit: Payer: Self-pay

## 2021-08-27 ENCOUNTER — Ambulatory Visit: Payer: No Typology Code available for payment source | Admitting: Pediatrics

## 2021-08-27 ENCOUNTER — Other Ambulatory Visit (HOSPITAL_COMMUNITY)
Admission: RE | Admit: 2021-08-27 | Discharge: 2021-08-27 | Disposition: A | Discharge status: Home / Self Care | Payer: No Typology Code available for payment source | Source: Ambulatory Visit | Attending: Pediatrics | Admitting: Pediatrics

## 2021-08-27 ENCOUNTER — Ambulatory Visit (INDEPENDENT_AMBULATORY_CARE_PROVIDER_SITE_OTHER): Payer: No Typology Code available for payment source | Admitting: Licensed Clinical Social Worker

## 2021-08-27 VITALS — BP 110/81 | HR 88 | Ht 68.5 in | Wt 109.6 lb

## 2021-08-27 DIAGNOSIS — Z3202 Encounter for pregnancy test, result negative: Secondary | ICD-10-CM

## 2021-08-27 DIAGNOSIS — F422 Mixed obsessional thoughts and acts: Secondary | ICD-10-CM

## 2021-08-27 DIAGNOSIS — F4329 Adjustment disorder with other symptoms: Secondary | ICD-10-CM | POA: Diagnosis not present

## 2021-08-27 DIAGNOSIS — F331 Major depressive disorder, recurrent, moderate: Secondary | ICD-10-CM | POA: Diagnosis not present

## 2021-08-27 DIAGNOSIS — R634 Abnormal weight loss: Secondary | ICD-10-CM | POA: Diagnosis not present

## 2021-08-27 DIAGNOSIS — R1084 Generalized abdominal pain: Secondary | ICD-10-CM

## 2021-08-27 DIAGNOSIS — N809 Endometriosis, unspecified: Secondary | ICD-10-CM

## 2021-08-27 DIAGNOSIS — Z113 Encounter for screening for infections with a predominantly sexual mode of transmission: Secondary | ICD-10-CM | POA: Insufficient documentation

## 2021-08-27 DIAGNOSIS — R1114 Bilious vomiting: Secondary | ICD-10-CM

## 2021-08-27 DIAGNOSIS — K582 Mixed irritable bowel syndrome: Secondary | ICD-10-CM

## 2021-08-27 DIAGNOSIS — Z1389 Encounter for screening for other disorder: Secondary | ICD-10-CM | POA: Diagnosis not present

## 2021-08-27 LAB — COMPREHENSIVE METABOLIC PANEL
AG Ratio: 2.4 (calc) (ref 1.0–2.5)
ALT: 9 U/L (ref 6–29)
AST: 11 U/L (ref 10–30)
Albumin: 5 g/dL (ref 3.6–5.1)
Alkaline phosphatase (APISO): 49 U/L (ref 31–125)
BUN: 15 mg/dL (ref 7–25)
CO2: 28 mmol/L (ref 20–32)
Calcium: 9.9 mg/dL (ref 8.6–10.2)
Chloride: 107 mmol/L (ref 98–110)
Creat: 0.8 mg/dL (ref 0.50–0.96)
Globulin: 2.1 g/dL (calc) (ref 1.9–3.7)
Glucose, Bld: 88 mg/dL (ref 65–139)
Potassium: 4.2 mmol/L (ref 3.5–5.3)
Sodium: 140 mmol/L (ref 135–146)
Total Bilirubin: 0.5 mg/dL (ref 0.2–1.2)
Total Protein: 7.1 g/dL (ref 6.1–8.1)

## 2021-08-27 LAB — POCT URINALYSIS DIPSTICK
Bilirubin, UA: NEGATIVE
Glucose, UA: NEGATIVE
Ketones, UA: NEGATIVE
Leukocytes, UA: NEGATIVE
Nitrite, UA: NEGATIVE
Protein, UA: NEGATIVE
Spec Grav, UA: 1.02 (ref 1.010–1.025)
Urobilinogen, UA: NEGATIVE E.U./dL — AB
pH, UA: 5 (ref 5.0–8.0)

## 2021-08-27 LAB — LIPASE: Lipase: 21 U/L (ref 7–60)

## 2021-08-27 LAB — MAGNESIUM: Magnesium: 2.2 mg/dL (ref 1.5–2.5)

## 2021-08-27 LAB — B12 AND FOLATE PANEL
Folate: >24 ng/mL
Vitamin B-12: 938 pg/mL (ref 200–1100)

## 2021-08-27 LAB — SEDIMENTATION RATE: Sed Rate: 2 mm/h (ref 0–20)

## 2021-08-27 LAB — PHOSPHORUS: Phosphorus: 3.8 mg/dL (ref 2.5–4.5)

## 2021-08-27 LAB — AMYLASE: Amylase: 33 U/L (ref 21–101)

## 2021-08-27 LAB — POCT URINE PREGNANCY: Preg Test, Ur: NEGATIVE

## 2021-08-27 LAB — VITAMIN D 25 HYDROXY (VIT D DEFICIENCY, FRACTURES): Vit D, 25-Hydroxy: 25 ng/mL — ABNORMAL LOW (ref 30–100)

## 2021-08-27 LAB — FERRITIN: Ferritin: 35 ng/mL (ref 16–154)

## 2021-08-27 MED ORDER — GABAPENTIN 100 MG PO CAPS: 100.0000 mg | ORAL_CAPSULE | Freq: Three times a day (TID) | ORAL | 1 refills | Status: DC

## 2021-08-27 NOTE — BH Specialist Note (Signed)
Integrated Behavioral Health Initial In-Person Visit  MRN: [id number] Name: Jaime Bauer  Number of [place] Clinician visits:: 1/6 Session Start time: 2:05p  Session End time: 2:36p Total time:  31  minutes  Types of Service: Individual psychotherapy  Interpretor:No. Interpretor Name and Language: N/A  Subjective: Jaime Bauer is a 21 y.o. female accompanied by  Self Patient was referred by her nutritionist  for Stomach concerns and anxiety. Patient reports the following symptoms/concerns: Anxiety and depression symptoms- recent panic attacks and stomach concerns. Duration of problem: years ; Severity of problem: severe  Objective: Mood: Dysphoric and Affect: Depressed Risk of harm to self or others: No plan to harm self or others  Life Context: Family and Social: Lives alone.  School/Work: [employer]Works at [employer] Part time.   Self-Care: Like homework and going to school.  Life Changes: No life changes   Bio-Psycho Social History:  Health habits: Sleep:7 hours daily.  Eating habits/patterns: Not able to eat much-Cant eat due to nausea. Small portions-3 times a day.   Water intake: 1-2 bottles of water a day.  Screen time: 3-4 hours a day Exercise: None   Gender identity: Female  Sex assigned at birth: Female  Pronouns: She/Her Tobacco?  no Drugs/ETOH?  no Partner preference?  female  Sexually Active?  no  Pregnancy Prevention:   IUD Reviewed condoms:  no Reviewed EC:  no   History or current traumatic events (natural disaster, house fire, etc.)? yes, Dad was diagnosed with stage 3 colon cancer History or current physical trauma?  no History or current emotional trauma?  no History or current sexual trauma?  no History or current domestic or intimate partner violence?  no History of bullying:  yes, at school [employer])  Trusted adult at home/school:  yes, does have trusted adults  but does not understand everything she has going on.  Feels safe at home:  yes Trusted friends:  yes Feels safe at school:  yes  Suicidal or homicidal thoughts?   No Self injurious behaviors?  No Auditory or Visual Disturbances/Hallucinations?   No Guns in the home?  no  Previous or Current Psychotherapy/Treatments  Patient has received outpatient therapy 3 years ago. Didn't feel that it was successful.  EMDR- was not successful  Brain Paint twice- was not successful  Takes medications for anxiety and depression--Fluoxetine   Has a nutritionist   Patient and/or Family's Strengths/Protective Factors: Social and Emotional competence, Concrete supports in place (healthy food, safe environments, etc.), and Physical Health (exercise, healthy diet, medication compliance, etc.)  Goals Addressed: Patient will: Increase knowledge and/or ability of: coping skills, healthy habits, and self-management skills  Demonstrate ability to: Increase healthy adjustment to current life circumstances  Progress towards Goals: Ongoing  Interventions: Interventions utilized: Mindfulness or Relaxation Training, Supportive Counseling, and Supportive Reflection  Standardized Assessments completed: Not Needed  Patient and/or Family Response: Patient "Jaime Bauer" reports she's had stomach issues for years and several doctors has dismissed her and has told her that she has an eating disorder. Patient reports not feeling well currently. She reports she has loss a lot of weight and currently can not eat although at times she wants to eat. Patient reports feeling weak and shares that it is hard for her to move. Patient reports currently taking medications for depression and anxiety. She reports her deprssion and anxiety stems from doctors and family ignoring her and thinking she's making a choice not to eat. Patient does experience  anxiety attacks when her stomach starts hurting or symptoms starts to worsen; throwing up  after she eats because certain foods doesn't agree with her digestive system.   Patient Centered Plan: Patient is on the following Treatment Plan(s):  Anxiety and depression  Assessment: Patient currently experiencing Anxiety and depression symptoms as well as stomach concerns.   Patient may benefit from continuing to see her nutritionist, medication management and outpatient therapy.  Plan: Follow up with behavioral health clinician on : 09/11/21 at 2:30p Behavioral recommendations: Recommended that patient continue eating bland foods such as bread, crossiants, oatmeal, chicken and some veggies. Suggested that patient not mix foods together as this does not agree with her digestive system. Recommended that patient practice mindfulness meditation for relaxation-Meditation app to reduce anxiety symptoms for 15-20  mins at night before bedtime. Referral(s): [place] (In Clinic) "From scale of 1-10, how likely are you to follow plan?": Patient agreed to this plan.   [place] Kaena Santori, LCSWA

## 2021-08-27 NOTE — Patient Instructions (Addendum)
Start gabapentin 100 mg three times daily  We have ordered a gastric emptying study- they will call you for scheduling  EKG- [phone] Labs today, I will send over results to you on [employer] [address], [place] [address] [phone]

## 2021-08-27 NOTE — Progress Notes (Cosign Needed)
THIS RECORD MAY CONTAIN CONFIDENTIAL INFORMATION THAT SHOULD NOT BE RELEASED WITHOUT REVIEW OF THE SERVICE PROVIDER.  Adolescent Medicine Consultation Initial Visit Jaime Bauer  is a 21 y.o. female referred by Christen ButterJessup, Joy, NP here today for evaluation of anxiety, depression, stomach concerns, possible eating disorder.    Supervising Physician: Dr. Delorse LekMartha Perry    Review of records?  yes  Pertinent Labs? Yes, none recent   Growth Chart Viewed? yes   History was provided by the patient   Chief complaint: weight loss, abdominal pain  HPI:   PCP Confirmed?  yes   Referred by: Danise EdgeLaura Watson, RD  Has had outpatient therapy, "brain painting" x 2, EMDR. Denies trauma but was bullied a lot in school throughout all grades.   [place] from [place] for college at [employer] for soccer. Was not able to continue playing d/t concussions. Developed ongoing headaches, stomach pain, nausea, vomiting, weight loss, anxiety and depression.   Has a history of severe pain with periods and has tried numerous OCPs and now has an IUD.   Reports she is never really hungry. When she does eat she is immediately full and doesn't want to eat anymore.   Seeing Noland FordyceKelly Fogleman for IUD and possible dx of endometriosis. Has options of gnrh and laparoscopy. Menarche at age 21. Has always had dysmenorrhea, worse in the past few years. Ibuprofen used to help, not as much anymore. Used to come fairly regularly, sometimes more than once a month.   Has had mention of gastroparesis in the past. Has been on med for gastroparesis in the past which she said didn't help. Has not had gastric emptying study   24 hour recall:  B: oatmeal (plain with honey)  L: few potato chips  D: 1/4 chick fila cool wrap   Pain with eating, last time vomiting was a little bit ago. Has had gagging but trying not to vomit. For a really long time was stuck with constipation but in the last week had a lot of diarrhea. In that week  she would see lots of undigested food moving through. Stomach pain would get worse when she would get the urge to defecate. Last stool was 2 days ago. Pain markedly worse during menstrual cycle and will often flip to diarrhea during times of menstrual cramping that are not during period. Menarche at age 21, cycles worsening in the last few years.   Pain 4-5/10 today. At its worst 8-9/10. Nothing seems to make it better. Food and beverage makes worse. Parents tried to renourish her over break but she was not able to tolerate. Has discussed feeding tube but parents have felt like this aws the "easy way out." Has tried ensure/boost and even sipping on it sits very heavy on her stomach and makes her feel more bloated.   Taking fluoxetine, psychiatrist on [facility]. Mirtaz in the past, made too sleepy. Lately fluoxetine doesn't feel like it has been well managed. Has been on same med for about 4 years. Not currently seeing a therapist, preference for female, in person.   Has chronic migraines. Gets monthly injections to manage. They are fairly well managed.   Patient's last menstrual period was 07/09/2021 (approximate).  No Known Allergies Current Outpatient Medications on File Prior to Visit  Medication Sig Dispense Refill   FLUoxetine (PROZAC) 20 MG capsule Take 60 mg by mouth every morning.     promethazine (PHENERGAN) 12.5 MG tablet Take 0.5 tablets (6.25 mg total) by mouth every 8 (eight)  hours as needed for nausea or vomiting. 20 tablet 0   zonisamide (ZONEGRAN) 100 MG capsule Take 100 mg by mouth daily.     No current facility-administered medications on file prior to visit.    Patient Active Problem List   Diagnosis Date Noted   Painful menstrual periods 05/23/2021   Generalized abdominal pain 05/11/2021   Bloating 05/11/2021   Irritable bowel syndrome with both constipation and diarrhea 03/24/2021   Abnormal uterine bleeding (AUB) 02/13/2021   Irregular bowel habits 11/14/2020    Constipation 11/14/2020   Diarrhea 11/14/2020   Weight loss of more than 10% body weight 11/14/2020   Bilious vomiting with nausea 11/14/2020   Anorexia 11/14/2020   Atypical chest pain 11/14/2020   Endometriosis 02/12/2019   Anxiety 02/01/2008   Depression 02/01/2008   OCD (obsessive compulsive disorder) 02/01/2008    Past Medical History:  Reviewed and updated?  yes Past Medical History:  Diagnosis Date   Anxiety    Chronic headaches    Concussion    w/o LOC   Depression    History of multiple concussions    from soccer   Syncope     Family History: Reviewed and updated? yes Family History  Problem Relation Age of Onset   Skin cancer Mother    Colon polyps Mother    Colon cancer Father [id number]   Colon polyps Father    Rectal cancer Father    Stroke Maternal Grandfather    Hypertension Paternal Uncle    Esophageal cancer Neg Hx    Inflammatory bowel disease Neg Hx    Liver disease Neg Hx    Pancreatic cancer Neg Hx    Stomach cancer Neg Hx     Social History:    Lifestyle habits that can impact QOL: Sleep:poor sleep, difficulty falling asleep Eating habits/patterns: as above  Water intake: poor  Exercise: none  Confidentiality was discussed with the patient and if applicable, with caregiver as well.  Gender identity: female Sex assigned at birth: female  Pronouns: she Tobacco?  no Drugs/ETOH?  no Partner preference?  female  Sexually Active?  no  Pregnancy Prevention:  none Reviewed condoms:  no Reviewed EC:  no   History or current traumatic events (natural disaster, house fire, etc.)? no History or current physical trauma?  no History or current emotional trauma?  no History or current sexual trauma?  no History or current domestic or intimate partner violence?  no History of bullying:  yes  Trusted adult at home/school:  yes Feels safe at home:  yes Trusted friends:  yes Feels safe at school:  yes  Suicidal or homicidal thoughts?   no Self  injurious behaviors?  no  The following portions of the patient's history were reviewed and updated as appropriate: allergies, current medications, past family history, past medical history, past social history, past surgical history, and problem list.  Physical Exam:  Vitals:   08/27/21 1458 08/27/21 1516  BP: 110/76 110/81  Pulse: 74 88  Weight: 109 lb 9.6 oz (49.7 kg)   Height: 5' 8.5" (1.74 m)    BP 110/81    Pulse 88    Ht 5' 8.5" (1.74 m)    Wt 109 lb 9.6 oz (49.7 kg)    LMP 07/09/2021 (Approximate)    BMI 16.42 kg/m  Body mass index: body mass index is 16.42 kg/m. Growth percentile [employer] can only be used for patients less than 22 years old.   Physical Exam Vitals and nursing  note reviewed.  Constitutional:      General: She is not in acute distress.    Appearance: She is well-developed and underweight.     Comments: Appears fatigued  Neck:     Thyroid: No thyromegaly.  Cardiovascular:     Rate and Rhythm: Normal rate and regular rhythm.     Heart sounds: No murmur heard. Pulmonary:     Breath sounds: Normal breath sounds.  Abdominal:     Palpations: Abdomen is soft. There is no mass.     Tenderness: There is no abdominal tenderness. There is no guarding.  Musculoskeletal:     Right lower leg: No edema.     Left lower leg: No edema.  Lymphadenopathy:     Cervical: No cervical adenopathy.  Skin:    General: Skin is warm.     Capillary Refill: Capillary refill takes 2 to 3 seconds.     Findings: Acne present. No rash.  Neurological:     Mental Status: She is alert.     Comments: No tremor  Psychiatric:        Mood and Affect: Mood is anxious. Affect is flat.        Speech: Speech is delayed.        Behavior: Behavior normal.     Assessment/Plan: 1. Moderate episode of recurrent major depressive disorder (HCC) Depression symptoms not currently well controlled on fluoxetine. Would like to establish with therapist, recommended trying to connect with [facility]. Will likely make medication adjustments in the future, may benefit more from sertraline but will also consider TCA in the setting of her abdominal pain. Will need to get EKG to monitor QTC.   2. Weight loss of more than 10% body weight Will order gastric emptying study given her parents didn't want her to go back to GI. She is agreeable to this. Will get labs as below to monitor overall physical health given her 35# weight loss in this year with 10% being over the last 3 months. Discussed that if we can't manage outpatient, would recommend admission for placement of NGT and titration of enteral feeds starting with continuous and working toward bolus. She was in agreement.  - NM Gastric Emptying - Amylase - Lipase - CBC With Differential - Comprehensive metabolic panel - Magnesium - Phosphorus - Sedimentation rate - Ferritin - VITAMIN D 25 Hydroxy (Vit-D Deficiency, Fractures) - B12 and Folate Panel - EKG 12-Lead  3. Mixed obsessional thoughts and acts As above, will consider sertraline.   4. Bilious vomiting with nausea Will get h. Pylori stool, had been negative on EGD.  - H. pylori antigen, stool  5. Irritable bowel syndrome with both constipation and diarrhea Will try gabapentin for the next week for visceral hyperalgesia. Suspect there is a significant component of mental health that is playing into somatic symptoms as well which we will continue trying to help her manage.  - gabapentin (NEURONTIN) 100 MG capsule; Take 1 capsule (100 mg total) by mouth 3 (three) times daily.  Dispense: 90 capsule; Refill: 1  6. Generalized abdominal pain May benefit from TCA, may also benefit from e-mycin or similar if gastroparesis.  - NM Gastric Emptying  7. Endometriosis Managed by Noland Fordyce. I spoke with her after the visit- we should give IUD at least 4 months to assess for improvement in symptoms. If she hasn't stopped bleeding in 12 weeks total, will send her back for  further assessment of placement and consideration of options to  stop bleeding. May also consider trial of gnrh agnoist if not successful, which if she had any GI endometriosis lesions, IUD and gnrh should help.   8. Routine screening for STI (sexually transmitted infection) Per protocol.  - Urine cytology ancillary only  9. Screening for genitourinary condition Blood from menstrual, no ketones.  - POCT urinalysis dipstick  10. Pregnancy examination or test, negative result Neg.  - POCT urine pregnancy    BH screenings:  PHQ-SADS Last 3 Score only 08/28/2021 08/07/2021 03/07/2021  PHQ-15 Score 25 - -  Total GAD-7 Score 6 5 21   PHQ Adolescent Score 15 16 12     EAT-26: 11.   Screens performed during this visit were discussed with patient and parent and adjustments to plan made accordingly.   Follow-up:   Return in about 1 week (around 09/03/2021) for With , [place] follow-up.   I spent >60 minutes spent face to face with patient with more than 50% of appointment spent discussing diagnosis, management, follow-up, and reviewing of anxiety, depression, abdominal pain, weight loss, menstrual symptoms. I spent an additional 15 minutes on pre-and post-visit activities.  A copy of this consultation visit was sent to: 09/05/2021, NP, [place] Citizen, NP

## 2021-08-29 LAB — URINE CYTOLOGY ANCILLARY ONLY
Bacterial Vaginitis-Urine: NEGATIVE
Candida Urine: NEGATIVE
Chlamydia: NEGATIVE
Comment: NEGATIVE
Comment: NEGATIVE
Comment: NORMAL
Neisseria Gonorrhea: NEGATIVE
Trichomonas: NEGATIVE

## 2021-09-03 ENCOUNTER — Encounter (HOSPITAL_COMMUNITY)
Admission: RE | Admit: 2021-09-03 | Discharge: 2021-09-03 | Disposition: A | Discharge status: Home / Self Care | Payer: No Typology Code available for payment source | Source: Ambulatory Visit | Attending: Pediatrics | Admitting: Pediatrics

## 2021-09-03 ENCOUNTER — Other Ambulatory Visit: Payer: Self-pay

## 2021-09-03 DIAGNOSIS — R634 Abnormal weight loss: Secondary | ICD-10-CM | POA: Insufficient documentation

## 2021-09-03 DIAGNOSIS — R1084 Generalized abdominal pain: Secondary | ICD-10-CM | POA: Diagnosis present

## 2021-09-03 MED ORDER — TECHNETIUM TC 99M SULFUR COLLOID: 2.0000 | Freq: Once | INTRAVENOUS | Status: DC | PRN

## 2021-09-04 ENCOUNTER — Ambulatory Visit: Payer: No Typology Code available for payment source | Admitting: Pediatrics

## 2021-09-06 ENCOUNTER — Encounter: Payer: Self-pay | Admitting: Gastroenterology

## 2021-09-06 ENCOUNTER — Ambulatory Visit: Payer: No Typology Code available for payment source | Admitting: Pediatrics

## 2021-09-06 VITALS — BP 126/77 | HR 77 | Ht 68.7 in | Wt 108.2 lb

## 2021-09-06 DIAGNOSIS — K582 Mixed irritable bowel syndrome: Secondary | ICD-10-CM | POA: Diagnosis not present

## 2021-09-06 DIAGNOSIS — R1084 Generalized abdominal pain: Secondary | ICD-10-CM

## 2021-09-06 DIAGNOSIS — R634 Abnormal weight loss: Secondary | ICD-10-CM

## 2021-09-06 DIAGNOSIS — F5082 Avoidant/restrictive food intake disorder: Secondary | ICD-10-CM | POA: Diagnosis not present

## 2021-09-06 DIAGNOSIS — Z1389 Encounter for screening for other disorder: Secondary | ICD-10-CM

## 2021-09-06 DIAGNOSIS — F422 Mixed obsessional thoughts and acts: Secondary | ICD-10-CM | POA: Diagnosis not present

## 2021-09-06 LAB — POCT URINALYSIS DIPSTICK
Bilirubin, UA: NEGATIVE
Glucose, UA: NEGATIVE
Ketones, UA: NEGATIVE
Leukocytes, UA: NEGATIVE
Nitrite, UA: NEGATIVE
Protein, UA: POSITIVE — AB
Spec Grav, UA: >=1.03 — AB (ref 1.010–1.025)
Urobilinogen, UA: NEGATIVE E.U./dL — AB
pH, UA: 5 (ref 5.0–8.0)

## 2021-09-06 NOTE — Progress Notes (Signed)
History was provided by the patient.  Jaime Bauer is a 21 y.o. female who is here for arfid, abdominal pain, weight loss.  Samuel Bouche, NP   HPI:  Pt reports she was put on linzess by GI- she was on the medium dose for a few months but had diarrhea and running to the bathroom multiple times a day. Never took rifixamin when it was prescribed- it was not well covered by insurance.   Hasn't had a stool this week.   24 hour recall:  2 croissants  1/2 chickfila coolwrap  1 starbucks refresher  Sips of water   Not having bleeding with IUD today. Last time was yesterday morning. Denies pain with urination. Urine has been very dark and cloudy, especially the last few days. Denies urgency.   Feels desperate and feels like she could barely make it to this appointment today, that's all she was holding out for.   No LMP recorded. (Menstrual status: IUD).   Patient Active Problem List   Diagnosis Date Noted   Painful menstrual periods 05/23/2021   Generalized abdominal pain 05/11/2021   Bloating 05/11/2021   Irritable bowel syndrome with both constipation and diarrhea 03/24/2021   Abnormal uterine bleeding (AUB) 02/13/2021   Irregular bowel habits 11/14/2020   Constipation 11/14/2020   Diarrhea 11/14/2020   Weight loss of more than 10% body weight 11/14/2020   Bilious vomiting with nausea 11/14/2020   Anorexia 11/14/2020   Atypical chest pain 11/14/2020   Endometriosis 02/12/2019   Anxiety 02/01/2008   Depression 02/01/2008   OCD (obsessive compulsive disorder) 02/01/2008    Current Outpatient Medications on File Prior to Visit  Medication Sig Dispense Refill   FLUoxetine (PROZAC) 20 MG capsule Take 60 mg by mouth every morning.     gabapentin (NEURONTIN) 100 MG capsule Take 1 capsule (100 mg total) by mouth 3 (three) times daily. 90 capsule 1   promethazine (PHENERGAN) 12.5 MG tablet Take 0.5 tablets (6.25 mg total) by mouth every 8 (eight) hours as needed for nausea or  vomiting. 20 tablet 0   zonisamide (ZONEGRAN) 100 MG capsule Take 100 mg by mouth daily.     Current Facility-Administered Medications on File Prior to Visit  Medication Dose Route Frequency Provider Last Rate Last Admin   technetium sulfur colloid ([place]COMED[place]) injection solution 2 millicurie  2 millicurie Intravenous Once PRN Ma Rings, MD        No Known Allergies   Physical Exam:    Vitals:   09/06/21 0901  BP: 126/77  Pulse: 77  Weight: 108 lb 3.2 oz (49.1 kg)  Height: 5' 8.7" (1.745 m)    Growth percentile SmartLinks can only be used for patients less than 35 years old.  Physical Exam Vitals and nursing note reviewed.  Constitutional:      General: She is not in acute distress.    Appearance: She is well-developed.  Eyes:     Pupils: Pupils are equal, round, and reactive to light.  Neck:     Thyroid: No thyromegaly.  Cardiovascular:     Rate and Rhythm: Normal rate and regular rhythm.     Pulses:          Radial pulses are 1+ on the right side and 1+ on the left side.     Heart sounds: No murmur heard. Pulmonary:     Breath sounds: Normal breath sounds.  Abdominal:     General: Abdomen is scaphoid.     Palpations: Abdomen is  soft. There is no mass.     Tenderness: There is generalized abdominal tenderness and tenderness in the left lower quadrant. There is no guarding.  Musculoskeletal:     Right lower leg: No edema.     Left lower leg: No edema.  Lymphadenopathy:     Cervical: No cervical adenopathy.  Skin:    General: Skin is warm.     Capillary Refill: Capillary refill takes 2 to 3 seconds.     Coloration: Skin is pale.     Findings: No rash.  Neurological:     General: No focal deficit present.     Mental Status: She is alert.     Comments: No tremor  Psychiatric:        Mood and Affect: Mood is anxious.        Speech: Speech is delayed.    Assessment/Plan: 1. Irritable bowel syndrome with both constipation and diarrhea This continues to be  the leading suspected diagnosis, gastric emptying study was recently normal. She has significant constipation this week, though has been unable to take much PO.   2. Weight loss of more than 10% body weight Continues to lose weight. Cap refill is delayed. Some pallor and mentation seems slower. Would benefit from nutritional rehabilitation inpatient with likely NG tube which she is agreeable to, however, I am having difficulty placing her. I have spoken with family med who declined admission. Spoke to [facility]- 2-3 week wait. Talked to Dr. Lockie Pares who suggested possibly internal med, may need to admit through ED.   3. Mixed obsessional thoughts and acts Likely needs med change.   4. Avoidant-restrictive food intake disorder (ARFID) As above. May benefit from residential setting depending on how nutritional rehab goes. I have also spoken with her dietitian who is in support of admission at this point and starting slow continuous tube feedings.   5. Screening for genitourinary condition Sig dehydration. Has not had bleeding with IUD but does not report any urinary sx today.  - POCT Urinalysis Dipstick  Return pending placement  Jonathon Resides, FNP

## 2021-09-06 NOTE — Progress Notes (Signed)
Case discussed with FNP Maxwell Caul who has been seeing this patient over the course of the last few weeks.  She has also been seen with Dr. Delorse Lek.  These are 2 of the providers of the team in [facility] for child and adolescent health. Based on the most recent evaluation by FNP Maxwell Caul, it seems the patient is having concerns progressive weight loss.  Back in November she weighed 121 pounds.  She currently weighs 108 pounds on the recent clinic note.  After extensive GI evaluation we have not found any source/etiology for her symptoms.  I was able to obtain enough rifaximin for an empiric round of IBS-D for SIBO treatment and she will not need to pay for this.  I am not sure that this is going to make a difference in her symptoms but we certainly can have that available for her.  It is possible that the patient may need a feeding tube to be placed for enteral nutrition and a quaternary center evaluation.  If an NG tube is placed she can be evaluated and monitored for potential refeeding syndrome and then discharged.  We do not have an inpatient GI service so if the patient does come into the hospital either through the ED and then admitted for observation versus comes in with the medical service then the GI service will be happy to consult.  Interventional radiology would be able to place a Dobbhoff tube or we could ask our staff to try and place a core track which may be more tolerable for the patient.  Dietary/nutrition consultation could be had as well.  I have updated FNP Hacker.  I will have my team have available Xifaxan which would be taken as 550 mg 3 times daily x10 days.  If she wants to pick that up, she just needs to come to the third-floor office and ask for [facility] and we will be able to give that to the patient.  I am happy to be available for further discussion/consultation.  However, with the extensiveness of her progressive weight loss and our extensive work-up having  been completed here in [place], I believe she likely will need to undergo a quaternary center evaluation at [facility] or [place] or [place].  I can be available if needed for enteral feeding prescription if needed after she is evaluated by nutrition if that is the case.   Corliss Parish, MD [facility] Office # [phone]

## 2021-09-06 NOTE — Patient Instructions (Signed)
[email or web address]

## 2021-09-07 ENCOUNTER — Observation Stay (HOSPITAL_COMMUNITY): Payer: No Typology Code available for payment source

## 2021-09-07 ENCOUNTER — Other Ambulatory Visit: Payer: Self-pay

## 2021-09-07 ENCOUNTER — Observation Stay (HOSPITAL_COMMUNITY)
Admission: EM | Admit: 2021-09-07 | Discharge: 2021-09-09 | Disposition: A | Discharge status: Home / Self Care | Payer: No Typology Code available for payment source | Attending: Internal Medicine | Admitting: Internal Medicine

## 2021-09-07 ENCOUNTER — Encounter: Payer: Self-pay | Admitting: Gastroenterology

## 2021-09-07 ENCOUNTER — Encounter (HOSPITAL_COMMUNITY): Payer: Self-pay

## 2021-09-07 DIAGNOSIS — R630 Anorexia: Secondary | ICD-10-CM | POA: Diagnosis present

## 2021-09-07 DIAGNOSIS — R109 Unspecified abdominal pain: Secondary | ICD-10-CM | POA: Diagnosis present

## 2021-09-07 DIAGNOSIS — R141 Gas pain: Secondary | ICD-10-CM

## 2021-09-07 DIAGNOSIS — Z8371 Family history of colonic polyps: Secondary | ICD-10-CM

## 2021-09-07 DIAGNOSIS — Z4659 Encounter for fitting and adjustment of other gastrointestinal appliance and device: Secondary | ICD-10-CM

## 2021-09-07 DIAGNOSIS — E46 Unspecified protein-calorie malnutrition: Secondary | ICD-10-CM | POA: Diagnosis not present

## 2021-09-07 DIAGNOSIS — R627 Adult failure to thrive: Secondary | ICD-10-CM | POA: Diagnosis present

## 2021-09-07 DIAGNOSIS — K582 Mixed irritable bowel syndrome: Secondary | ICD-10-CM | POA: Diagnosis not present

## 2021-09-07 DIAGNOSIS — Z800 Family history of malignant neoplasm of digestive organs: Secondary | ICD-10-CM

## 2021-09-07 DIAGNOSIS — F429 Obsessive-compulsive disorder, unspecified: Secondary | ICD-10-CM | POA: Diagnosis present

## 2021-09-07 DIAGNOSIS — R112 Nausea with vomiting, unspecified: Secondary | ICD-10-CM | POA: Diagnosis present

## 2021-09-07 DIAGNOSIS — Z87820 Personal history of traumatic brain injury: Secondary | ICD-10-CM

## 2021-09-07 DIAGNOSIS — G8929 Other chronic pain: Secondary | ICD-10-CM | POA: Diagnosis present

## 2021-09-07 DIAGNOSIS — F5082 Avoidant/restrictive food intake disorder: Secondary | ICD-10-CM | POA: Insufficient documentation

## 2021-09-07 DIAGNOSIS — Z20822 Contact with and (suspected) exposure to covid-19: Secondary | ICD-10-CM | POA: Diagnosis not present

## 2021-09-07 DIAGNOSIS — E43 Unspecified severe protein-calorie malnutrition: Secondary | ICD-10-CM | POA: Diagnosis present

## 2021-09-07 DIAGNOSIS — Z823 Family history of stroke: Secondary | ICD-10-CM

## 2021-09-07 DIAGNOSIS — Z975 Presence of (intrauterine) contraceptive device: Secondary | ICD-10-CM

## 2021-09-07 DIAGNOSIS — G43909 Migraine, unspecified, not intractable, without status migrainosus: Secondary | ICD-10-CM | POA: Diagnosis present

## 2021-09-07 DIAGNOSIS — R197 Diarrhea, unspecified: Secondary | ICD-10-CM | POA: Diagnosis present

## 2021-09-07 DIAGNOSIS — Z818 Family history of other mental and behavioral disorders: Secondary | ICD-10-CM

## 2021-09-07 DIAGNOSIS — Z808 Family history of malignant neoplasm of other organs or systems: Secondary | ICD-10-CM

## 2021-09-07 DIAGNOSIS — R000 Tachycardia, unspecified: Secondary | ICD-10-CM | POA: Diagnosis present

## 2021-09-07 DIAGNOSIS — R140 Abdominal distension (gaseous): Secondary | ICD-10-CM | POA: Diagnosis not present

## 2021-09-07 DIAGNOSIS — R1084 Generalized abdominal pain: Secondary | ICD-10-CM

## 2021-09-07 DIAGNOSIS — R194 Change in bowel habit: Secondary | ICD-10-CM

## 2021-09-07 DIAGNOSIS — Z79899 Other long term (current) drug therapy: Secondary | ICD-10-CM | POA: Diagnosis not present

## 2021-09-07 DIAGNOSIS — Z681 Body mass index (BMI) 19 or less, adult: Secondary | ICD-10-CM

## 2021-09-07 DIAGNOSIS — F419 Anxiety disorder, unspecified: Secondary | ICD-10-CM | POA: Diagnosis present

## 2021-09-07 DIAGNOSIS — K5900 Constipation, unspecified: Secondary | ICD-10-CM | POA: Diagnosis present

## 2021-09-07 DIAGNOSIS — Z8249 Family history of ischemic heart disease and other diseases of the circulatory system: Secondary | ICD-10-CM

## 2021-09-07 DIAGNOSIS — F32A Depression, unspecified: Secondary | ICD-10-CM | POA: Diagnosis present

## 2021-09-07 DIAGNOSIS — R634 Abnormal weight loss: Secondary | ICD-10-CM | POA: Diagnosis not present

## 2021-09-07 LAB — COMPREHENSIVE METABOLIC PANEL
ALT: 14 U/L (ref 0–44)
AST: 13 U/L — ABNORMAL LOW (ref 15–41)
Albumin: 4.4 g/dL (ref 3.5–5.0)
Alkaline Phosphatase: 44 U/L (ref 38–126)
Anion gap: 7 (ref 5–15)
BUN: 13 mg/dL (ref 6–20)
CO2: 23 mmol/L (ref 22–32)
Calcium: 9.5 mg/dL (ref 8.9–10.3)
Chloride: 107 mmol/L (ref 98–111)
Creatinine, Ser: 0.78 mg/dL (ref 0.44–1.00)
GFR, Estimated: >60 mL/min (ref >60)
Glucose, Bld: 92 mg/dL (ref 70–99)
Potassium: 3.8 mmol/L (ref 3.5–5.1)
Sodium: 137 mmol/L (ref 135–145)
Total Bilirubin: 0.8 mg/dL (ref 0.3–1.2)
Total Protein: 7 g/dL (ref 6.5–8.1)

## 2021-09-07 LAB — RESP PANEL BY RT-PCR (FLU A&B, COVID) ARPGX2
Influenza A by PCR: NEGATIVE
Influenza B by PCR: NEGATIVE
SARS Coronavirus 2 by RT PCR: NEGATIVE

## 2021-09-07 LAB — PREGNANCY, URINE: Preg Test, Ur: NEGATIVE

## 2021-09-07 LAB — CBC WITH DIFFERENTIAL/PLATELET
Abs Immature Granulocytes: 0.03 10*3/uL (ref 0.00–0.07)
Basophils Absolute: 0 10*3/uL (ref 0.0–0.1)
Basophils Relative: 1 %
Eosinophils Absolute: 0 10*3/uL (ref 0.0–0.5)
Eosinophils Relative: 1 %
HCT: 38 % (ref 36.0–46.0)
Hemoglobin: 12.7 g/dL (ref 12.0–15.0)
Immature Granulocytes: 1 %
Lymphocytes Relative: 30 %
Lymphs Abs: 1.4 10*3/uL (ref 0.7–4.0)
MCH: 30 pg (ref 26.0–34.0)
MCHC: 33.4 g/dL (ref 30.0–36.0)
MCV: 89.8 fL (ref 80.0–100.0)
Monocytes Absolute: 0.3 10*3/uL (ref 0.1–1.0)
Monocytes Relative: 7 %
Neutro Abs: 2.8 10*3/uL (ref 1.7–7.7)
Neutrophils Relative %: 60 %
Platelets: 271 10*3/uL (ref 150–400)
RBC: 4.23 MIL/uL (ref 3.87–5.11)
RDW: 12.4 % (ref 11.5–15.5)
WBC: 4.6 10*3/uL (ref 4.0–10.5)
nRBC: 0 % (ref 0.0–0.2)

## 2021-09-07 LAB — CBG MONITORING, ED: Glucose-Capillary: 104 mg/dL — ABNORMAL HIGH (ref 70–99)

## 2021-09-07 LAB — MAGNESIUM
Magnesium: 2.1 mg/dL (ref 1.7–2.4)
Magnesium: 2 mg/dL (ref 1.7–2.4)

## 2021-09-07 LAB — URINALYSIS, ROUTINE W REFLEX MICROSCOPIC
Bacteria, UA: NONE SEEN
Bilirubin Urine: NEGATIVE
Glucose, UA: NEGATIVE mg/dL
Ketones, ur: NEGATIVE mg/dL
Leukocytes,Ua: NEGATIVE
Nitrite: NEGATIVE
Protein, ur: NEGATIVE mg/dL
Specific Gravity, Urine: 1.025 (ref 1.005–1.030)
pH: 6 (ref 5.0–8.0)

## 2021-09-07 LAB — PHOSPHORUS
Phosphorus: 3.6 mg/dL (ref 2.5–4.6)
Phosphorus: 2.1 mg/dL — ABNORMAL LOW (ref 2.5–4.6)

## 2021-09-07 LAB — LIPASE, BLOOD: Lipase: 30 U/L (ref 11–51)

## 2021-09-07 LAB — POTASSIUM: Potassium: 3.2 mmol/L — ABNORMAL LOW (ref 3.5–5.1)

## 2021-09-07 MED ORDER — IOHEXOL 300 MG/ML  SOLN
75.0000 mL | Freq: Once | INTRAMUSCULAR | Status: AC | PRN
  Administered 2021-09-07: 75 mL via INTRAVENOUS

## 2021-09-07 MED ORDER — PROCHLORPERAZINE EDISYLATE 10 MG/2ML IJ SOLN
10.0000 mg | Freq: Four times a day (QID) | INTRAMUSCULAR | Status: DC | PRN
  Administered 2021-09-07 – 2021-09-08 (×3): 10 mg via INTRAVENOUS
  Filled 2021-09-07 (×3): qty 2

## 2021-09-07 MED ORDER — VITAL HIGH PROTEIN PO LIQD: 1000.0000 mL | ORAL | Status: DC

## 2021-09-07 MED ORDER — FREE WATER
100.0000 mL | Status: DC
  Administered 2021-09-07 – 2021-09-08 (×7): 100 mL

## 2021-09-07 MED ORDER — METOCLOPRAMIDE HCL 5 MG/ML IJ SOLN
5.0000 mg | Freq: Four times a day (QID) | INTRAMUSCULAR | Status: DC | PRN
  Administered 2021-09-07: 5 mg via INTRAVENOUS
  Filled 2021-09-07: qty 2

## 2021-09-07 MED ORDER — SODIUM CHLORIDE (PF) 0.9 % IJ SOLN
INTRAMUSCULAR | Status: AC
  Filled 2021-09-07: qty 50

## 2021-09-07 MED ORDER — METOCLOPRAMIDE HCL 5 MG/ML IJ SOLN: 10.0000 mg | Freq: Four times a day (QID) | INTRAMUSCULAR | Status: DC | PRN

## 2021-09-07 MED ORDER — PALONOSETRON HCL INJECTION 0.25 MG/5ML: 0.2500 mg | Freq: Once | INTRAVENOUS | Status: DC

## 2021-09-07 MED ORDER — KATE FARMS STANDARD 1.4 PO LIQD
480.0000 mL | Freq: Every day | ORAL | Status: DC
  Administered 2021-09-07 – 2021-09-08 (×2): 480 mL
  Filled 2021-09-07 (×2): qty 650

## 2021-09-07 MED ORDER — SUCRALFATE 1 GM/10ML PO SUSP
1.0000 g | Freq: Two times a day (BID) | ORAL | Status: DC
  Filled 2021-09-07 (×2): qty 10

## 2021-09-07 MED ORDER — SODIUM CHLORIDE 0.9 % IV SOLN: Freq: Once | INTRAVENOUS | Status: AC

## 2021-09-07 NOTE — ED Notes (Signed)
Pt to CT at this time.

## 2021-09-07 NOTE — Consult Note (Addendum)
Consultation  Referring Provider:   Dr. Tamera Punt Primary Care Physician:  Samuel Bouche, NP Primary Gastroenterologist: Dr. Rush Landmark       Reason for Consultation: Malnourishment, weight loss         HPI:   Jaime Bauer is a 21 y.o. female with a past medical history of depression and anxiety as well as others listed below, who presents to the ER today with complaint of weight loss.    Today, patient is seen with her mother by her bedside who does assist with history.  Patient tells me that over the past 3 years she has lost a total of 30 pounds and this has all worsened over the past 2 months.  She tells me just in the past 2 months she has lost about 10-15 pounds or more.  She describes constant generalized abdominal discomfort somewhat worse on the left side and nausea with occasional episodes of vomiting.  Explains that when she tries to eat anything she gets very full very fast and bloated and just stops eating.  In her typical day over the past 2 months she will eat maybe 1/4 cup of prepared oatmeal in the morning, 1 croissant for lunch and typically 2-3 dumplings from [employer] for dinner.  She also drinks a Starbucks refresher throughout the day and will have "a little bit of water".  She was tried on various medications including Pantoprazole which was unhelpful.  Also given Remeron but this made her very sleepy, even at a half dose and so she discontinued it.  She never tried the Carafate because "just looking at it made me feel sick".  Bowel habits tend to radiate back and forth from diarrhea to constipation.    Tells me she had an IUD placed in December and has been spotting off and on since then.    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  She tells me that she enjoys school and does not find it stressful, denies any acute anxiety or stressful situations in her life at the moment.  Mother does tell me she has a high pain tolerance apparently  got her cartilage pierced and never flinched.    Denies fever, chills, change in bowel habits or blood in her stool.  GI history: 09/06/2021 phone note: Dr. Rush Landmark was in discussion with patient's child and adolescent health care team and discussed progressive weight loss, apparently November weighed 121 pounds and had dropped down to 108 at recent clinic visit, noted extensive GI eval with no source/etiology for symptoms discussed possible feeding tube to be placed for enteral nutrition and a coronary center evaluation discussed NG tube to start and evaluate for refeeding syndrome 09/03/2021 gastric emptying study normal 06/25/2021 patient message: Concerned that she cannot eat, Carafate to try 06/15/2021 last office visit with Dr. Rush Landmark: At that time discussed irritable bowel with constipation diarrhea, generalized abdominal pain, bloating, upset stomach and anorexia (please see that comprehensive note for further details); plan at that time to continue FD guard daily for 1 week and then transition off, antiemetics as needed for possible SIBO testing and future, empiric Xifaxan 550 twice daily x10 days transition off PPI and is 145 mcg of 05/02/2021 pelvic ultrasound normal 04/24/2021 CT the abdomen pelvis with contrast with no findings to explain weight loss or pain 01/09/2021 EGD with erythematous mucosa in the antrum; pathology showed mild reactive changes 01/09/2021 colonoscopy with nonbleeding nonthrombosed internal hemorrhoids  Past Medical History:  Diagnosis Date   Anxiety    Chronic headaches    Concussion    w/o LOC   Depression    History of multiple concussions    from soccer   Syncope     Past Surgical History:  Procedure Laterality Date   COLONOSCOPY  01/09/2021   NO PAST SURGERIES     UPPER GASTROINTESTINAL ENDOSCOPY  01/09/2021   2020- [facility]    Family History  Problem Relation Age of Onset   Skin cancer Mother    Colon polyps Mother    Colon cancer Father 63    Colon polyps Father    Rectal cancer Father    Stroke Maternal Grandfather    Hypertension Paternal Uncle    Esophageal cancer Neg Hx    Inflammatory bowel disease Neg Hx    Liver disease Neg Hx    Pancreatic cancer Neg Hx    Stomach cancer Neg Hx     Social History   Tobacco Use   Smoking status: Never   Smokeless tobacco: Never  Vaping Use   Vaping Use: Never used  Substance Use Topics   Alcohol use: Never   Drug use: Never    Prior to Admission medications   Medication Sig Start Date End Date Taking? Authorizing Provider  FLUoxetine (PROZAC) 20 MG capsule Take 60 mg by mouth every morning. 02/20/21   [provider]  gabapentin (NEURONTIN) 100 MG capsule Take 1 capsule (100 mg total) by mouth 3 (three) times daily. 08/27/21   Trude Mcburney, FNP  promethazine (PHENERGAN) 12.5 MG tablet Take 0.5 tablets (6.25 mg total) by mouth every 8 (eight) hours as needed for nausea or vomiting. 03/07/21   Samuel Bouche, NP  zonisamide (ZONEGRAN) 100 MG capsule Take 100 mg by mouth daily.    [provider]    No current facility-administered medications for this encounter.   Current Outpatient Medications  Medication Sig Dispense Refill   FLUoxetine (PROZAC) 20 MG capsule Take 60 mg by mouth every morning.     gabapentin (NEURONTIN) 100 MG capsule Take 1 capsule (100 mg total) by mouth 3 (three) times daily. 90 capsule 1   promethazine (PHENERGAN) 12.5 MG tablet Take 0.5 tablets (6.25 mg total) by mouth every 8 (eight) hours as needed for nausea or vomiting. 20 tablet 0   zonisamide (ZONEGRAN) 100 MG capsule Take 100 mg by mouth daily.      Allergies as of 09/07/2021   (No Known Allergies)     Review of Systems:    Constitutional: No weight loss, fever or chills Skin: No rash Cardiovascular: No chest pain Respiratory: No SOB  Gastrointestinal: See HPI and otherwise negative Genitourinary: No dysuria  Neurological: No headache, dizziness or  syncope Musculoskeletal: No new muscle or joint pain Hematologic: No bleeding  Psychiatric: No history of depression or anxiety    Physical Exam:  Vital signs in last 24 hours: Temp:  [97.4 F (36.3 C)] 97.4 F (36.3 C) (02/03 1006) Pulse Rate:  [80] 80 (02/03 1006) Resp:  [14] 14 (02/03 1006) BP: (124)/(86) 124/86 (02/03 1006) SpO2:  [100 %] 100 % (02/03 1006) Weight:  [49.1 kg] 49.1 kg (02/03 1008)   General:   Pleasant thin appearing, malnourished young Caucasian female appears to be in NAD, alert and cooperative Head:  Normocephalic and atraumatic. Eyes:   PEERL, EOMI. No icterus. Conjunctiva pink. Ears:  Normal auditory acuity. Neck:  Supple Throat: Oral cavity and pharynx without  inflammation, swelling or lesion.  Lungs: Respirations even and unlabored. Lungs clear to auscultation bilaterally.   No wheezes, crackles, or rhonchi.  Heart: Normal S1, S2. No MRG. Regular rate and rhythm. No peripheral edema, cyanosis or pallor.  Abdomen:  Soft, nondistended, mild generalized TTP (patient does not wince on exam but tells me that it hurts more so on the left side) no rebound or guarding. Normal bowel sounds. No appreciable masses or hepatomegaly. Rectal:  Not performed.  Msk:  Symmetrical without gross deformities. Peripheral pulses intact.  Extremities:  Without edema, no deformity or joint abnormality.  Neurologic:  Alert and  oriented x4;  grossly normal neurologically.  Skin:   Dry and intact without significant lesions or rashes. Psychiatric: Demonstrates good judgement and reason without abnormal affect or behaviors.   LAB RESULTS: Recent Labs    09/07/21 1046  WBC 4.6  HGB 12.7  HCT 38.0  PLT 271   BMET Recent Labs    09/07/21 1046  NA 137  K 3.8  CL 107  CO2 23  GLUCOSE 92  BUN 13  CREATININE 0.78  CALCIUM 9.5   LFT Recent Labs    09/07/21 1046  PROT 7.0  ALBUMIN 4.4  AST 13*  ALT 14  ALKPHOS 44  BILITOT 0.8     Impression / Plan:    Impression: 1.  Weight loss: 30 pounds over the past 3 years, 15 pounds over the past couple of months, GI work-up so far negative including TSH, EGD, colonoscopy and CT as well as gastric emptying study and pelvic ultrasound; possible psychosomatic causes versus SIBO versus other  Plan: 1.  Placed consult for core track and consult to dietitian to initiate feeds and assess patient.  (Was advised they do not have cortrak here at [place] long-will proceed with small bore) 2.  Discussed plan with the patient and her mother and answered questions. 3.  Started Carafate 1G BID to see if this is beneficial 4. Also discussion about Xifaxan was had in the past which could be tried. 5. May benefit from psych consult while here just to evaluate status and consider psychosomatic cause 6. Ordered Ct chest to consider any possible malignancy 7. Could also consider SB imaging with capsule or other modality, given nonspecific finding of lymphoid aggregates in small bowel possibly ESR/CRP/Fecal calprotectin, check for Addisons, calcium level  Thank you for your kind consultation, we will continue to follow.  Lavone Nian Elyce Zollinger  09/07/2021, 12:20 PM

## 2021-09-07 NOTE — ED Provider Triage Note (Signed)
Emergency Medicine Provider Triage Evaluation Note  Jaime Bauer , a 21 y.o. female  was evaluated in triage.  Pt complains of nausea, lower abdominal pain, diarrhea/constipation that has been ongoing for 3 years. Significant weighloss of over 12 months within the past few months. She has been seen by her pediatrician and Dr. Rush Landmark from [place]. They were sent in for admission for NG tube vs Dobhoff vs PEG placement for enteral nutrition.   Review of Systems  Positive: Abdominal pain, nausea, diarrhea, constipation, decreased appetite, weight loss  Negative: Fever, dysuria, hematuria.   Physical Exam  BP 124/86 (BP Location: Left Arm)    Pulse 80    Temp (!) 97.4 F (36.3 C) (Oral)    Resp 14    Ht 5' 8.7" (1.745 m)    Wt 49.1 kg    SpO2 100%    BMI 16.12 kg/m  Gen:   Awake, no distress   Resp:  Normal effort  MSK:   Moves extremities without difficulty  Other:  Mildly diffuse tenderness to palpation. No guarding or rebound. Soft. No distention  Medical Decision Making  Medically screening exam initiated at 10:24 AM.  Appropriate orders placed.  Tasheena Runde was informed that the remainder of the evaluation will be completed by another provider, this initial triage assessment does not replace that evaluation, and the importance of remaining in the ED until their evaluation is complete.  Will order basic labs. Dr. Rush Landmark is recommending inpatient treatment on the medicine floor with evaluation of refeeding symptoms afterwards.    Sherrell Puller, PA-C 09/07/21 1030

## 2021-09-07 NOTE — ED Provider Notes (Signed)
[facility] DEPT Provider Note   CSN: [id number] Arrival date & time: 09/07/21  1000     History  No chief complaint on file.   Jaime Bauer is a 21 y.o. female.  Patient is a 21 year old female who presents for possible feeding tube placement.  Chart was reviewed.  Patient's has been seen by her pediatrician and gastroenterology.  She has a diagnosis of IBS with alternating constipation and diarrhea and has had some chronic abdominal pain for the last 3 years.  She also has an eating disorder, ARFID.  She has had evaluation by gastroenterology.  She recently has had a 9 pound weight loss over the last month or so.  Given this, her physicians have felt that it would be best if she was admitted to the hospital for feeding tube placement and monitoring for refeeding syndrome.  She was sent to the emergency room to be admitted.  She denies any new issues.  No recent fevers.  No change in her abdominal symptoms.      Home Medications Prior to Admission medications   Medication Sig Start Date End Date Taking? Authorizing Provider  FLUoxetine (PROZAC) 20 MG capsule Take 60 mg by mouth every morning. 02/20/21   [provider]  gabapentin (NEURONTIN) 100 MG capsule Take 1 capsule (100 mg total) by mouth 3 (three) times daily. 08/27/21   Trude Mcburney, FNP  promethazine (PHENERGAN) 12.5 MG tablet Take 0.5 tablets (6.25 mg total) by mouth every 8 (eight) hours as needed for nausea or vomiting. 03/07/21   Samuel Bouche, NP  zonisamide (ZONEGRAN) 100 MG capsule Take 100 mg by mouth daily.    [provider]      Allergies    Patient has no known allergies.    Review of Systems   Review of Systems  Constitutional:  Positive for fatigue and unexpected weight change. Negative for chills, diaphoresis and fever.  HENT:  Negative for congestion, rhinorrhea and sneezing.   Eyes: Negative.   Respiratory:  Negative for cough, chest tightness and  shortness of breath.   Cardiovascular:  Negative for chest pain and leg swelling.  Gastrointestinal:  Positive for abdominal pain, constipation, diarrhea and nausea. Negative for blood in stool and vomiting.  Genitourinary:  Negative for difficulty urinating, flank pain, frequency and hematuria.  Musculoskeletal:  Negative for arthralgias and back pain.  Skin:  Negative for rash.  Neurological:  Negative for dizziness, speech difficulty, weakness, numbness and headaches.   Physical Exam Updated Vital Signs BP 124/86 (BP Location: Left Arm)    Pulse 80    Temp (!) 97.4 F (36.3 C) (Oral)    Resp 14    Ht 5' 8.7" (1.745 m)    Wt 49.1 kg    SpO2 100%    BMI 16.12 kg/m  Physical Exam Constitutional:      Appearance: She is well-developed.  HENT:     Head: Normocephalic and atraumatic.  Eyes:     Pupils: Pupils are equal, round, and reactive to light.  Cardiovascular:     Rate and Rhythm: Normal rate and regular rhythm.     Heart sounds: Normal heart sounds.  Pulmonary:     Effort: Pulmonary effort is normal. No respiratory distress.     Breath sounds: Normal breath sounds. No wheezing or rales.  Chest:     Chest wall: No tenderness.  Abdominal:     General: Bowel sounds are normal.     Palpations: Abdomen is  soft.     Tenderness: There is no abdominal tenderness. There is no guarding or rebound.  Musculoskeletal:        General: Normal range of motion.     Cervical back: Normal range of motion and neck supple.  Lymphadenopathy:     Cervical: No cervical adenopathy.  Skin:    General: Skin is warm and dry.     Findings: No rash.  Neurological:     Mental Status: She is alert and oriented to person, place, and time.    ED Results / Procedures / Treatments   Labs (all labs ordered are listed, but only abnormal results are displayed) Labs Reviewed  COMPREHENSIVE METABOLIC PANEL - Abnormal; Notable for the following components:      Result Value   AST 13 (*)    All other  components within normal limits  URINALYSIS, ROUTINE W REFLEX MICROSCOPIC - Abnormal; Notable for the following components:   Hgb urine dipstick SMALL (*)    All other components within normal limits  RESP PANEL BY RT-PCR (FLU A&B, COVID) ARPGX2  CBC WITH DIFFERENTIAL/PLATELET  LIPASE, BLOOD  PREGNANCY, URINE  MAGNESIUM  PHOSPHORUS    EKG None  Radiology No results found.  Procedures Procedures    Medications Ordered in ED Medications - No data to display  ED Course/ Medical Decision Making/ A&P                           Medical Decision Making Amount and/or Complexity of Data Reviewed Independent Historian: parent    Details: mother at bedside External Data Reviewed: notes. Labs: ordered. Decision-making details documented in ED Course.  Risk Decision regarding hospitalization.   Patient is a 21 year old female who presents with weight loss resulting from chronic abdominal issues and eating disorder.  She had labs done today which are nonconcerning.  These were reviewed by me.  I reviewed her chart.  She is followed by [facility] gastroenterology.  It has been recommended that she be admitted for feeding tube placement and monitoring for refeeding syndrome.  I consulted Dr. Marylyn Ishihara with the hospitalist service who will admit the patient.  I spoke with Ellouise Newer, PA with gastroenterology.  She will contact the on-call physician and decide on further treatment recommendations.  Final Clinical Impression(s) / ED Diagnoses Final diagnoses:  Malnutrition, unspecified type ([place])  Weight loss  Avoidant-restrictive food intake disorder (ARFID)    Rx / DC Orders ED Discharge Orders     None         Malvin Johns, MD 09/07/21 1158

## 2021-09-07 NOTE — Progress Notes (Signed)
10 french Dobhoff placed- mother at bedside with patient through procedure. Patient tolerated well. KUB placed per protocol. After x-ray confirmation stylet will be removed by RRT RN.

## 2021-09-07 NOTE — ED Notes (Signed)
Pt not tolerating feeding tube at this time, states any time she talks she vomits. Pt would like the feeding tube taken out at this time. This nurse discussed with the pt and her Mother that this nurse would page Hospitalist, Dr. Ronaldo Miyamoto with their concerns.

## 2021-09-07 NOTE — Progress Notes (Signed)
Initial Nutrition Assessment  DOCUMENTATION CODES:   Underweight  INTERVENTION:  - will order Dillard Essex 1.4 @ 20 ml/hr which will provide 672 kcal, 29 grams protein, 4 grams fiber, and 346 ml free water. - will monitor for tolerance and labs prior to advancing or providing advancement recommendations.  - will order 100 ml free water every 4 hours (600 ml/24 hours).   - recommend vitamin D supplementation due to low serum vitamin D. - recommend 100 mg thiamine/day.  - recommend checking serum folate, thiamine, vitamin A, vitamin B6.  Monitor magnesium, potassium, and phosphorus BID for at least 3 days, MD to replete as needed, as pt is at risk for refeeding syndrome given likely malnutrition, hx of ARFID.   NUTRITION DIAGNOSIS:   Inadequate oral intake related to nausea, vomiting, poor appetite as evidenced by per patient/family report.  GOAL:   Patient will meet greater than or equal to 90% of their needs  MONITOR:   Diet advancement, TF tolerance, Labs, Weight trends, I & O's  REASON FOR ASSESSMENT:   Consult Assessment of nutrition requirement/status, Enteral/tube feeding initiation and management  ASSESSMENT:   21 y.o. female with medical history significant of migraines, anxiety, and depression. She presented to the ED due to poor PO intake, N/V, abdominal pain, combination of constipation and diarrhea, and unintentional weight loss. At home Zofran and Phenergan were ineffective in controlling nausea. She reports that she has chronic GI problems. Over the last 3 years, she has lost 30 lbs; with 10 lbs in the last month. Work-up by GI has been unrevealing. Outpatient GI provider recommended patient present to the ED for feeding tube placement and monitoring for refeeding syndrome. She has been trialed on Remeron for appetite stimulation but it makes her drowsy and she feels it has been ineffective.  Patient presented to the ED for placement of feeding tube. Able to  communicate with RN via secure chat and 75F tube to be obtained from 2W at [facility]. Ongoing determination of when tube will be placed and by whom; not yet placed.   Patient to remain hospitalized for tube feeding/tube feeding advancement and monitoring of refeeding.   Patient follows with a child and adolescent GI clinic and pediatrician outpatient, per notes.   GI following in the hospital. Carafate started. CT chest ordered to r/o malignancy.   Weight today is 108 lb and weight on 06/15/21 was 121 lb. This indicates 13 lb weight loss (10.7% body weight) in the past 3 months; significant for time frame. Notes indicate reported 9 lb weight loss in the past ~1 month.   Based on weight loss and BMI, suspect patient meets criteria for malnutrition but will need to confirm with NFPE once patient moves to a floor.   She has not been seen by a dietitian within the [facility] system at any time in the past.   Notes indicate that she has been dx with IBS with alternating constipation and diarrhea. She has experienced chronic abdominal pain for the past 3 years.    Labs reviewed; K: 3.8 mmol/l, Phos: 3.6 mg/dl, Mg: 2.1 mg/dl., vitamin B12 WDL, vitamin D low at 25 ng/ml. Medications reviewed; 1 g carafate BID.     NUTRITION - FOCUSED PHYSICAL EXAM:  Complete once patient moves to a floor.  Diet Order:   Diet Order     None       EDUCATION NEEDS:   Not appropriate for education at this time  Skin:  Skin Assessment:  Reviewed RN Assessment  Last BM:  PTA/unknown  Height:   Ht Readings from Last 1 Encounters:  09/07/21 5' 8.7" (1.745 m)    Weight:   Wt Readings from Last 1 Encounters:  09/07/21 49.1 kg     BMI:  Body mass index is 16.12 kg/m.   Estimated Nutritional Needs:  Kcal:  1855-2150 kcal Protein:  95-110 grams Fluid:  >/= 2.2 L/day      Jarome Matin, MS, RD, LDN Inpatient Clinical Dietitian RD pager # available in [place]  After hours/weekend  pager # available in [facility]

## 2021-09-07 NOTE — ED Notes (Signed)
Discussed at length with pt and her Mother, that Dr. Rogelia Mire recommendation is keeping the feeding tube in place and medicating with compazine for nausea and a chloraseptic for throat discomfort. This nurse also discussed with pt and her Mother that at this time, we are waiting on the KUB images to be read and finalized by Radiology before the feeding tube's stylet is removed. Pt does not give this nurse an answer whether or not she would like the compazine and to keep the feeding tube in place. Pt stares blindly at nurse while tremulous on ED stretcher however awake and alert and VSS. This nurse stated to pt's Mother and pt that this nurse would leave the ED exam room and give the pt time to come to a decision. Will continue to monitor.

## 2021-09-07 NOTE — ED Notes (Signed)
ED TO INPATIENT HANDOFF REPORT  Name/Age/Gender Jaime Bauer 21 y.o. female  Code Status   Home/SNF/Other Home  Chief Complaint Intractable nausea and vomiting [R11.2]  Level of Care/Admitting Diagnosis ED Disposition     ED Disposition  Admit   Condition  --   Comment  Hospital Area: [facility] [[id number]]  Level of Care: Telemetry [5]  Admit to tele based on following criteria: Monitor for Ischemic changes  May place patient in observation at [facility] or [facility] if equivalent level of care is available:: No  Covid Evaluation: Asymptomatic Screening Protocol (No Symptoms)  Diagnosis: Intractable nausea and vomiting [720114]  Admitting Physician: Teddy Spike [[id number]]  Attending Physician: Teddy Spike [[id number]]          Medical History Past Medical History:  Diagnosis Date   Anxiety    Chronic headaches    Concussion    w/o LOC   Depression    History of multiple concussions    from soccer   Syncope     Allergies No Known Allergies  IV Location/Drains/Wounds Patient Lines/Drains/Airways Status     Active Line/Drains/Airways     Name Placement date Placement time Site Days   Peripheral IV 09/07/21 20 G Right Antecubital 09/07/21  1900  Antecubital  less than 1   NG/OG Vented/Dual Lumen 10 Fr. Left nare Marking at nare/corner of mouth 65 cm 09/07/21  1720  Left nare  less than 1            Labs/Imaging Results for orders placed or performed during the hospital encounter of 09/07/21 (from the past 48 hour(s))  CBC with Differential     Status: None   Collection Time: 09/07/21 10:46 AM  Result Value Ref Range   WBC 4.6 4.0 - 10.5 K/uL   RBC 4.23 3.87 - 5.11 MIL/uL   Hemoglobin 12.7 12.0 - 15.0 g/dL   HCT 62.1 30.8 - 65.7 %   MCV 89.8 80.0 - 100.0 fL   MCH 30.0 26.0 - 34.0 pg   MCHC 33.4 30.0 - 36.0 g/dL   RDW 84.6 96.2 - 95.2 %   Platelets 271 150 - 400 K/uL   nRBC 0.0 0.0 - 0.2 %   Neutrophils Relative %  60 %   Neutro Abs 2.8 1.7 - 7.7 K/uL   Lymphocytes Relative 30 %   Lymphs Abs 1.4 0.7 - 4.0 K/uL   Monocytes Relative 7 %   Monocytes Absolute 0.3 0.1 - 1.0 K/uL   Eosinophils Relative 1 %   Eosinophils Absolute 0.0 0.0 - 0.5 K/uL   Basophils Relative 1 %   Basophils Absolute 0.0 0.0 - 0.1 K/uL   Immature Granulocytes 1 %   Abs Immature Granulocytes 0.03 0.00 - 0.07 K/uL    Comment: Performed at [facility], [address], [place], [place] [address]  Comprehensive metabolic panel     Status: Abnormal   Collection Time: 09/07/21 10:46 AM  Result Value Ref Range   Sodium 137 135 - 145 mmol/L   Potassium 3.8 3.5 - 5.1 mmol/L   Chloride 107 98 - 111 mmol/L   CO2 23 22 - 32 mmol/L   Glucose, Bld 92 70 - 99 mg/dL    Comment: Glucose reference range applies only to samples taken after fasting for at least 8 hours.   BUN 13 6 - 20 mg/dL   Creatinine, Ser 4.40 0.44 - 1.00 mg/dL   Calcium 9.5 8.9 - 10.2 mg/dL  Total Protein 7.0 6.5 - 8.1 g/dL   Albumin 4.4 3.5 - 5.0 g/dL   AST 13 (L) 15 - 41 U/L   ALT 14 0 - 44 U/L   Alkaline Phosphatase 44 38 - 126 U/L   Total Bilirubin 0.8 0.3 - 1.2 mg/dL   GFR, Estimated >70 >35 mL/min    Comment: (NOTE) Calculated using the CKD-EPI Creatinine Equation (2021)    Anion gap 7 5 - 15    Comment: Performed at [facility], [address], [place], [place] [address]  Lipase, blood     Status: None   Collection Time: 09/07/21 10:46 AM  Result Value Ref Range   Lipase 30 11 - 51 U/L    Comment: Performed at [facility], [address] [place], [place] [address]  Magnesium     Status: None   Collection Time: 09/07/21 10:46 AM  Result Value Ref Range   Magnesium 2.1 1.7 - 2.4 mg/dL    Comment: Performed at [facility], [address], [place], [place] [address]  Phosphorus     Status: None   Collection Time: 09/07/21 10:46 AM  Result Value Ref Range   Phosphorus 3.6  2.5 - 4.6 mg/dL    Comment: Performed at [facility], [address], [place], [place] [address]  Urinalysis, Routine w reflex microscopic Urine, Clean Catch     Status: Abnormal   Collection Time: 09/07/21 10:47 AM  Result Value Ref Range   Color, Urine YELLOW YELLOW   APPearance CLEAR CLEAR   Specific Gravity, Urine 1.025 1.005 - 1.030   pH 6.0 5.0 - 8.0   Glucose, UA NEGATIVE NEGATIVE mg/dL   Hgb urine dipstick SMALL (A) NEGATIVE   Bilirubin Urine NEGATIVE NEGATIVE   Ketones, ur NEGATIVE NEGATIVE mg/dL   Protein, ur NEGATIVE NEGATIVE mg/dL   Nitrite NEGATIVE NEGATIVE   Leukocytes,Ua NEGATIVE NEGATIVE   RBC / HPF 0-5 0 - 5 RBC/hpf   WBC, UA 0-5 0 - 5 WBC/hpf   Bacteria, UA NONE SEEN NONE SEEN   Squamous Epithelial / LPF 0-5 0 - 5   Mucus PRESENT     Comment: Performed at [facility], [address] [place], [place] [address]  Pregnancy, urine     Status: None   Collection Time: 09/07/21 10:47 AM  Result Value Ref Range   Preg Test, Ur NEGATIVE NEGATIVE    Comment:        THE SENSITIVITY OF THIS METHODOLOGY IS >20 mIU/mL. Performed at [facility], [address], [place], [place] [address]   Resp Panel by RT-PCR (Flu A&B, Covid) Nasopharyngeal Swab     Status: None   Collection Time: 09/07/21 11:34 AM   Specimen: Nasopharyngeal Swab; Nasopharyngeal(NP) swabs in vial transport medium  Result Value Ref Range   SARS Coronavirus 2 by RT PCR NEGATIVE NEGATIVE    Comment: (NOTE) SARS-CoV-2 target nucleic acids are NOT DETECTED.  The SARS-CoV-2 RNA is generally detectable in upper respiratory specimens during the acute phase of infection. The lowest concentration of SARS-CoV-2 viral copies this assay can detect is 138 copies/mL. A negative result does not preclude SARS-Cov-2 infection and should not be used as the sole basis for treatment or other patient management decisions. A negative result may occur with   improper specimen collection/handling, submission of specimen other than nasopharyngeal swab, presence of viral mutation(s) within the areas targeted by this assay, and inadequate number of  viral copies(<138 copies/mL). A negative result must be combined with clinical observations, patient history, and epidemiological information. The expected result is Negative.  Fact Sheet for Patients:  BloggerCourse.comhttps://www.fda.gov/media/152166/download  Fact Sheet for Healthcare Providers:  SeriousBroker.ithttps://www.fda.gov/media/152162/download  This test is no t yet approved or cleared by the [place] FDA and  has been authorized for detection and/or diagnosis of SARS-CoV-2 by FDA under an Emergency Use Authorization (EUA). This EUA will remain  in effect (meaning this test can be used) for the duration of the COVID-19 declaration under Section 564(b)(1) of the Act, 21 U.S.C.section 360bbb-3(b)(1), unless the authorization is terminated  or revoked sooner.       Influenza A by PCR NEGATIVE NEGATIVE   Influenza B by PCR NEGATIVE NEGATIVE    Comment: (NOTE) The Xpert Xpress SARS-CoV-2/FLU/RSV plus assay is intended as an aid in the diagnosis of influenza from Nasopharyngeal swab specimens and should not be used as a sole basis for treatment. Nasal washings and aspirates are unacceptable for Xpert Xpress SARS-CoV-2/FLU/RSV testing.  Fact Sheet for Patients: BloggerCourse.comhttps://www.fda.gov/media/152166/download  Fact Sheet for Healthcare Providers: SeriousBroker.ithttps://www.fda.gov/media/152162/download  This test is not yet approved or cleared by the [place] FDA and has been authorized for detection and/or diagnosis of SARS-CoV-2 by FDA under an Emergency Use Authorization (EUA). This EUA will remain in effect (meaning this test can be used) for the duration of the COVID-19 declaration under Section 564(b)(1) of the Act, 21 U.S.C. section 360bbb-3(b)(1), unless the authorization is terminated or revoked.  Performed at  [facility], [address] [place], [place] [address]   Magnesium     Status: None   Collection Time: 09/07/21  7:11 PM  Result Value Ref Range   Magnesium 2.0 1.7 - 2.4 mg/dL    Comment: Performed at [facility], [address] [place], [place] [address]  Phosphorus     Status: Abnormal   Collection Time: 09/07/21  7:11 PM  Result Value Ref Range   Phosphorus 2.1 (L) 2.5 - 4.6 mg/dL    Comment: Performed at [facility], [address] [place], [place] [address]  Potassium     Status: Abnormal   Collection Time: 09/07/21  7:11 PM  Result Value Ref Range   Potassium 3.2 (L) 3.5 - 5.1 mmol/L    Comment: Performed at [facility], [address] [place], [place] [address]  CBG monitoring, ED     Status: Abnormal   Collection Time: 09/07/21  8:04 PM  Result Value Ref Range   Glucose-Capillary 104 (H) 70 - 99 mg/dL    Comment: Glucose reference range applies only to samples taken after fasting for at least 8 hours.   Comment 1 Notify RN    Comment 2 Document in Chart    DG Abd 1 View  Result Date: 09/07/2021 CLINICAL DATA:  Nasogastric tube placement EXAM: ABDOMEN - 1 VIEW COMPARISON:  None. FINDINGS: Nasogastric tube with the tip projecting over the mid lower abdomen likely within the body of the stomach. No bowel dilatation to suggest obstruction. No evidence of pneumoperitoneum, portal venous gas or pneumatosis. No pathologic calcifications along the expected course of the ureters. No acute osseous abnormality. IMPRESSION: 1. Nasogastric tube with the tip projecting over the mid lower abdomen likely within the body of the stomach. Electronically Signed   By: Elige KoHetal  Patel M.D.   On: 09/07/2021 18:30   CT CHEST W CONTRAST  Result Date: 09/07/2021 CLINICAL DATA:  Weight loss  EXAM: CT CHEST WITH CONTRAST TECHNIQUE: Multidetector CT imaging of the chest was performed during intravenous contrast administration.  RADIATION DOSE REDUCTION: This exam was performed according to the departmental dose-optimization program which includes automated exposure control, adjustment of the mA and/or kV according to patient size and/or use of iterative reconstruction technique. CONTRAST:  63mL OMNIPAQUE IOHEXOL 300 MG/ML  SOLN COMPARISON:  None. FINDINGS: Cardiovascular: No significant vascular findings. Normal heart size. No pericardial effusion. Mediastinum/Nodes: No enlarged mediastinal, hilar, or axillary lymph nodes. Thyroid gland, trachea, and esophagus demonstrate no significant findings. Lungs/Pleura: Lungs are clear. No pleural effusion or pneumothorax. Upper Abdomen: No acute abnormality. Musculoskeletal: No chest wall abnormality. No acute or significant osseous findings. IMPRESSION: No acute process or suspicious mass identified in the chest. Electronically Signed   By: Jannifer Hick M.D.   On: 09/07/2021 14:00    Pending Labs Unresulted Labs (From admission, onward)     Start     Ordered   09/07/21 1717  Magnesium  (ICU Tube Feeding: PEPuP )  5A & 5P,   R (with TIMED occurrences)      09/07/21 1716   09/07/21 1717  Phosphorus  (ICU Tube Feeding: PEPuP )  5A & 5P,   R (with TIMED occurrences)      09/07/21 1716   Signed and Held  HIV Antibody (routine testing w rflx)  (HIV Antibody (Routine testing w reflex) panel)  Once,   R        Signed and Held   Signed and Held  Comprehensive metabolic panel  Tomorrow morning,   R        Signed and Held   Signed and Held  CBC  Tomorrow morning,   R        Signed and Held   Signed and Held  Vitamin B1  Once,   R        Signed and Held   Signed and Held  Folate, serum, performed at [facility]  Once,   R        Signed and Held   Signed and Held  Vitamin A  Once,   R        Signed and Held   Signed and Held  Vitamin B6  Once,   R        Signed and Held            Vitals/Pain Today's Vitals   09/07/21 1008 09/07/21 1656 09/07/21 1900 09/07/21 1930   BP:  (!) 98/59 111/78 102/65  Pulse:  78 100 86  Resp:  14 (!) 23 16  Temp:      TempSrc:      SpO2:  100% 100% 96%  Weight: 108 lb 3.2 oz (49.1 kg)     Height: 5' 8.7" (1.745 m)     PainSc:        Isolation Precautions No active isolations  Medications Medications  prochlorperazine (COMPAZINE) injection 10 mg (10 mg Intravenous Given 09/07/21 1900)  sucralfate (CARAFATE) 1 GM/10ML suspension 1 g (1 g Oral Patient Refused/Not Given 09/07/21 2035)  sodium chloride (PF) 0.9 % injection (has no administration in time range)  free water 100 mL (100 mLs Per Tube Given 09/07/21 2035)  feeding supplement (KATE FARMS STANDARD 1.4) liquid 480 mL (480 mLs Per Tube Given 09/07/21 2031)  palonosetron (ALOXI) injection 0.25 mg (has no administration in time range)  iohexol (OMNIPAQUE) 300 MG/ML solution 75 mL (75 mLs Intravenous Contrast Given 09/07/21 1337)  Mobility walks with person assist

## 2021-09-07 NOTE — ED Notes (Signed)
ICU Charge at the bedside to place 10French Dobhoff NG tube at this time.

## 2021-09-07 NOTE — H&P (Signed)
History and Physical    PatientJenniger Bauer GOT:[id number] DOB: 2001/04/24 DOA: 09/07/2021 DOS: the patient was seen and examined on 09/07/2021 PCP: Christen Butter, NP  Patient coming from: Home  Chief Complaint: Poor PO intake   HPI: Jaime Bauer is a 21 y.o. female with medical history significant of migraines, anxiety. Presenting poor PO intake and unintentional weight loss. She reports that she has chronic GI problems. Over the last 3 years, she has lost 30 lbs; with 10 lbs in the last month. Her w/u w/ GI has been unrevealing. She was seen by GI today. They recommended that she go to the ED to have a feeding tube placed and be monitored for refeeding syndrome. She reports that she has nausea and vomiting. She has a global abdominal pain that seems to be worse in the LLQ. She reports a constant back and forth between constipation and diarrhea with her currently being in a cycle of constipation. She has tried zofran and phenergan for nausea. They are ineffective. She has tried remeron for appetite stimulation. It makes her too drowsy and doesn't seem to help. She denies any other aggravating or alleviating factors.   Review of Systems: As mentioned in the history of present illness. All other systems reviewed and are negative. Past Medical History:  Diagnosis Date   Anxiety    Chronic headaches    Concussion    w/o LOC   Depression    History of multiple concussions    from soccer   Syncope    Past Surgical History:  Procedure Laterality Date   COLONOSCOPY  01/09/2021   NO PAST SURGERIES     UPPER GASTROINTESTINAL ENDOSCOPY  01/09/2021   2020- Fl keys   Social History:  reports that she has never smoked. She has never used smokeless tobacco. She reports that she does not drink alcohol and does not use drugs.  No Known Allergies  Family History  Problem Relation Age of Onset   Skin cancer Mother    Colon polyps Mother    Colon cancer Father 26   Colon polyps Father     Rectal cancer Father    Stroke Maternal Grandfather    Hypertension Paternal Uncle    Esophageal cancer Neg Hx    Inflammatory bowel disease Neg Hx    Liver disease Neg Hx    Pancreatic cancer Neg Hx    Stomach cancer Neg Hx     Prior to Admission medications   Medication Sig Start Date End Date Taking? Authorizing Provider  FLUoxetine (PROZAC) 20 MG capsule Take 60 mg by mouth every morning. 02/20/21   [provider]  gabapentin (NEURONTIN) 100 MG capsule Take 1 capsule (100 mg total) by mouth 3 (three) times daily. 08/27/21   Verneda Skill, FNP  promethazine (PHENERGAN) 12.5 MG tablet Take 0.5 tablets (6.25 mg total) by mouth every 8 (eight) hours as needed for nausea or vomiting. 03/07/21   Christen Butter, NP  zonisamide (ZONEGRAN) 100 MG capsule Take 100 mg by mouth daily.    [provider]    Physical Exam: Vitals:   09/07/21 1006 09/07/21 1008  BP: 124/86   Pulse: 80   Resp: 14   Temp: (!) 97.4 F (36.3 C)   TempSrc: Oral   SpO2: 100%   Weight:  49.1 kg  Height:  5' 8.7" (1.745 m)   General: 21 y.o. female resting in bed in NAD Eyes: PERRL, normal sclera ENMT: Nares patent w/o discharge, orophaynx clear,  dentition normal, ears w/o discharge/lesions/ulcers Neck: Supple, trachea midline Cardiovascular: RRR, +S1, S2, no m/g/r, equal pulses throughout Respiratory: CTABL, no w/r/r, normal WOB GI: BS+, NDNT, no masses noted, no organomegaly noted MSK: No e/c/c Neuro: A&O x 3, no focal deficits Psyc: Appropriate interaction but flat affect, calm/cooperative   Data Reviewed:  CMP and CBC wnl  Assessment and Plan: No notes have been filed under this hospital service. Service: Hospitalist Poor PO intake/Failure to thrive Unintentional Weight Loss Intractable nausea     - place in obs, tele     - NGT ordered, dietitian consulted ordered     - [facility] to follow, appreciate assistance; will differ further imaging/scopes to them     - fluids,  compazine  Underweight Severe protein-calorie malnutrition     - see above  Advance Care Planning: FULL  Consults: [facility]  Family Communication: w/ mom at bedside  Severity of Illness: The appropriate patient status for this patient is INPATIENT. Inpatient status is judged to be reasonable and necessary in order to provide the required intensity of service to ensure the patient's safety. The patient's presenting symptoms, physical exam findings, and initial radiographic and laboratory data in the context of their chronic comorbidities is felt to place them at high risk for further clinical deterioration. Furthermore, it is not anticipated that the patient will be medically stable for discharge from the hospital within 2 midnights of admission.   * I certify that at the point of admission it is my clinical judgment that the patient will require inpatient hospital care spanning beyond 2 midnights from the point of admission due to high intensity of service, high risk for further deterioration and high frequency of surveillance required.*  Author: Teddy Spike, DO 09/07/2021 12:02 PM  For on call review [email or web address].

## 2021-09-07 NOTE — ED Triage Notes (Signed)
Patient's mother reports that the patient was sent by her GI doctor to be admitted for a refeeding program.

## 2021-09-07 NOTE — Progress Notes (Signed)
Based on the patient's significant weight loss over the course the last 2 months, she has now been evaluated by GI and she has been evaluated through the child and after lessened clinic it seems that feeding tube placement and monitoring for refeeding syndrome is going to be an important aspect for her.  If she does decide to come into the emergency department to be further evaluated, I do think it is reasonable for her to be admitted for at least observation and for nasogastric tube placement versus Dobbhoff placement versus core track placement.  Nutrition can evaluate her.  She could be monitored for refeeding syndrome for a few days and subsequently from there decision can be made whether a PEG tube would be placed versus close continued use of a nasal gastric/nasojejunal tube for feeding.  She was going to discuss this with her new child and adolescent medical team.  They have kept me up-to-date from a recent clinic visit within the last few days.  The adult GI inpatient service will be happy to evaluate the patient in consultation if the patient decides to come into the hospital for further evaluation.  Severe protein calorie malnutrition would be a main diagnosis for her admission/observation.   Corliss Parish, MD  [facility] Office # [phone]

## 2021-09-08 DIAGNOSIS — R112 Nausea with vomiting, unspecified: Secondary | ICD-10-CM | POA: Diagnosis not present

## 2021-09-08 DIAGNOSIS — F32A Depression, unspecified: Secondary | ICD-10-CM

## 2021-09-08 DIAGNOSIS — E440 Moderate protein-calorie malnutrition: Secondary | ICD-10-CM

## 2021-09-08 DIAGNOSIS — R627 Adult failure to thrive: Secondary | ICD-10-CM

## 2021-09-08 DIAGNOSIS — F064 Anxiety disorder due to known physiological condition: Secondary | ICD-10-CM | POA: Diagnosis not present

## 2021-09-08 DIAGNOSIS — F429 Obsessive-compulsive disorder, unspecified: Secondary | ICD-10-CM

## 2021-09-08 DIAGNOSIS — F419 Anxiety disorder, unspecified: Secondary | ICD-10-CM | POA: Diagnosis not present

## 2021-09-08 DIAGNOSIS — R630 Anorexia: Secondary | ICD-10-CM | POA: Diagnosis not present

## 2021-09-08 LAB — GLUCOSE, CAPILLARY
Glucose-Capillary: 104 mg/dL — ABNORMAL HIGH (ref 70–99)
Glucose-Capillary: 108 mg/dL — ABNORMAL HIGH (ref 70–99)
Glucose-Capillary: 108 mg/dL — ABNORMAL HIGH (ref 70–99)
Glucose-Capillary: 112 mg/dL — ABNORMAL HIGH (ref 70–99)
Glucose-Capillary: 152 mg/dL — ABNORMAL HIGH (ref 70–99)
Glucose-Capillary: 98 mg/dL (ref 70–99)

## 2021-09-08 LAB — COMPREHENSIVE METABOLIC PANEL
ALT: 15 U/L (ref 0–44)
AST: 12 U/L — ABNORMAL LOW (ref 15–41)
Albumin: 4.1 g/dL (ref 3.5–5.0)
Alkaline Phosphatase: 41 U/L (ref 38–126)
Anion gap: 9 (ref 5–15)
BUN: 14 mg/dL (ref 6–20)
CO2: 19 mmol/L — ABNORMAL LOW (ref 22–32)
Calcium: 8.9 mg/dL (ref 8.9–10.3)
Chloride: 109 mmol/L (ref 98–111)
Creatinine, Ser: 0.59 mg/dL (ref 0.44–1.00)
GFR, Estimated: >60 mL/min (ref >60)
Glucose, Bld: 107 mg/dL — ABNORMAL HIGH (ref 70–99)
Potassium: 3.5 mmol/L (ref 3.5–5.1)
Sodium: 137 mmol/L (ref 135–145)
Total Bilirubin: 0.6 mg/dL (ref 0.3–1.2)
Total Protein: 6.4 g/dL — ABNORMAL LOW (ref 6.5–8.1)

## 2021-09-08 LAB — FOLATE: Folate: 20.6 ng/mL (ref >5.9)

## 2021-09-08 LAB — MAGNESIUM
Magnesium: 2.1 mg/dL (ref 1.7–2.4)
Magnesium: 2 mg/dL (ref 1.7–2.4)

## 2021-09-08 LAB — CBC
HCT: 33.4 % — ABNORMAL LOW (ref 36.0–46.0)
Hemoglobin: 11.2 g/dL — ABNORMAL LOW (ref 12.0–15.0)
MCH: 30.4 pg (ref 26.0–34.0)
MCHC: 33.5 g/dL (ref 30.0–36.0)
MCV: 90.5 fL (ref 80.0–100.0)
Platelets: 265 10*3/uL (ref 150–400)
RBC: 3.69 MIL/uL — ABNORMAL LOW (ref 3.87–5.11)
RDW: 12.5 % (ref 11.5–15.5)
WBC: 9.9 10*3/uL (ref 4.0–10.5)
nRBC: 0 % (ref 0.0–0.2)

## 2021-09-08 LAB — PHOSPHORUS
Phosphorus: 4.6 mg/dL (ref 2.5–4.6)
Phosphorus: 3.5 mg/dL (ref 2.5–4.6)

## 2021-09-08 LAB — HIV ANTIBODY (ROUTINE TESTING W REFLEX): HIV Screen 4th Generation wRfx: NONREACTIVE

## 2021-09-08 MED ORDER — KATE FARMS STANDARD 1.4 PO LIQD
480.0000 mL | Freq: Every day | ORAL | Status: DC
  Administered 2021-09-08: 480 mL

## 2021-09-08 MED ORDER — DRONABINOL 2.5 MG PO CAPS
2.5000 mg | ORAL_CAPSULE | Freq: Two times a day (BID) | ORAL | Status: DC
  Administered 2021-09-08 – 2021-09-09 (×2): 2.5 mg via ORAL
  Filled 2021-09-08 (×2): qty 1

## 2021-09-08 MED ORDER — KATE FARMS STANDARD 1.4 PO LIQD: 960.0000 mL | Freq: Every day | ORAL | Status: DC

## 2021-09-08 MED ORDER — PROSOURCE PLUS PO LIQD
30.0000 mL | Freq: Two times a day (BID) | ORAL | Status: DC
  Administered 2021-09-09: 30 mL via ORAL
  Filled 2021-09-08: qty 30

## 2021-09-08 MED ORDER — FLUOXETINE HCL 20 MG PO CAPS
60.0000 mg | ORAL_CAPSULE | Freq: Every morning | ORAL | Status: DC
  Administered 2021-09-09: 60 mg via ORAL
  Filled 2021-09-08 (×2): qty 3

## 2021-09-08 MED ORDER — ACETAMINOPHEN 325 MG PO TABS
650.0000 mg | ORAL_TABLET | Freq: Four times a day (QID) | ORAL | Status: DC | PRN
  Administered 2021-09-08: 650 mg via ORAL
  Filled 2021-09-08: qty 2

## 2021-09-08 MED ORDER — PHENOL 1.4 % MT LIQD
1.0000 | OROMUCOSAL | Status: DC | PRN
  Administered 2021-09-08: 1 via OROMUCOSAL
  Filled 2021-09-08: qty 177

## 2021-09-08 MED ORDER — PROSOURCE TF PO LIQD
45.0000 mL | Freq: Every day | ORAL | Status: DC
  Filled 2021-09-08: qty 45

## 2021-09-08 MED ORDER — KATE FARMS STANDARD 1.4 PO LIQD
325.0000 mL | Freq: Two times a day (BID) | ORAL | Status: DC
  Administered 2021-09-09: 325 mL via ORAL
  Filled 2021-09-08 (×2): qty 325

## 2021-09-08 MED ORDER — ZONISAMIDE 25 MG PO CAPS
150.0000 mg | ORAL_CAPSULE | Freq: Every day | ORAL | Status: DC
  Administered 2021-09-08: 150 mg via ORAL
  Filled 2021-09-08 (×2): qty 2

## 2021-09-08 MED ORDER — LACTATED RINGERS IV SOLN: INTRAVENOUS | Status: DC

## 2021-09-08 NOTE — Progress Notes (Signed)
Progress Note   Subjective  Patient miserable with nasoenteric tube; unable to drink or eat anything without near immediate vomiting with tube in place Asking to have tube removed Did have small bowel movement this morning No abdominal pain. No nausea or vomiting with tube feeds going at 20 mL/h; vomiting only occurs when she tries to eat or drink with tube in place  Mom and dad at bedside   Objective  Vital signs in last 24 hours: Temp:  [97.9 F (36.6 C)-98.2 F (36.8 C)] 97.9 F (36.6 C) (02/04 1030) Pulse Rate:  [78-113] 108 (02/04 1030) Resp:  [14-23] 16 (02/04 1030) BP: (98-115)/(59-78) 109/69 (02/04 1030) SpO2:  [96 %-100 %] 98 % (02/04 1030) Weight:  [49.7 kg] 49.7 kg (02/04 0500) Last BM Date:  (PTA)  Gen: awake, alert, NAD HEENT: anicteric, nasoenteric tube in left nare CV: Slightly tachycardic Pulm: CTA b/l Abd: soft, thin, NT/ND, +BS throughout Ext: no c/c/e Neuro: nonfocal   Intake/Output from previous day: 02/03 0701 - 02/04 0700 In: 400 [NG/GT:400] Out: -  Intake/Output this shift: No intake/output data recorded.  Lab Results: Recent Labs    09/07/21 1046 09/08/21 0756  WBC 4.6 9.9  HGB 12.7 11.2*  HCT 38.0 33.4*  PLT 271 265   BMET Recent Labs    09/07/21 1046 09/07/21 1911 09/08/21 0756  NA 137  --  137  K 3.8 3.2* 3.5  CL 107  --  109  CO2 23  --  19*  GLUCOSE 92  --  107*  BUN 13  --  14  CREATININE 0.78  --  0.59  CALCIUM 9.5  --  8.9   LFT Recent Labs    09/08/21 0756  PROT 6.4*  ALBUMIN 4.1  AST 12*  ALT 15  ALKPHOS 41  BILITOT 0.6   PT/INR No results for input(s): LABPROT, INR in the last 72 hours. Hepatitis Panel No results for input(s): HEPBSAG, HCVAB, HEPAIGM, HEPBIGM in the last 72 hours.  Studies/Results: DG Abd 1 View  Result Date: 09/07/2021 CLINICAL DATA:  Nasogastric tube placement EXAM: ABDOMEN - 1 VIEW COMPARISON:  None. FINDINGS: Nasogastric tube with the tip projecting over the mid lower  abdomen likely within the body of the stomach. No bowel dilatation to suggest obstruction. No evidence of pneumoperitoneum, portal venous gas or pneumatosis. No pathologic calcifications along the expected course of the ureters. No acute osseous abnormality. IMPRESSION: 1. Nasogastric tube with the tip projecting over the mid lower abdomen likely within the body of the stomach. Electronically Signed   By: Elige KoHetal  Patel M.D.   On: 09/07/2021 18:30   CT CHEST W CONTRAST  Result Date: 09/07/2021 CLINICAL DATA:  Weight loss EXAM: CT CHEST WITH CONTRAST TECHNIQUE: Multidetector CT imaging of the chest was performed during intravenous contrast administration. RADIATION DOSE REDUCTION: This exam was performed according to the departmental dose-optimization program which includes automated exposure control, adjustment of the mA and/or kV according to patient size and/or use of iterative reconstruction technique. CONTRAST:  75mL OMNIPAQUE IOHEXOL 300 MG/ML  SOLN COMPARISON:  None. FINDINGS: Cardiovascular: No significant vascular findings. Normal heart size. No pericardial effusion. Mediastinum/Nodes: No enlarged mediastinal, hilar, or axillary lymph nodes. Thyroid gland, trachea, and esophagus demonstrate no significant findings. Lungs/Pleura: Lungs are clear. No pleural effusion or pneumothorax. Upper Abdomen: No acute abnormality. Musculoskeletal: No chest wall abnormality. No acute or significant osseous findings. IMPRESSION: No acute process or suspicious mass identified in the chest. Electronically Signed  By: Jannifer Hick M.D.   On: 09/07/2021 14:00      Assessment & Recommendations  21 year old with ongoing issues with malnutrition, abdominal bloating/intermittent nausea/alternating diarrhea and constipation, history of concussion and concern for postconcussion syndrome, anxiety and depression.  1.  Malnutrition/intermittent nausea with abdominal bloating and alternating bowel habits/depression with  eating disorder/history of concussion/weight loss --Complex situation for Shaleena who has lost additional weight over the last several weeks to few months.  She has slowly had a more and more restricted diet because at times food has been associated with negative GI symptoms.  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  She has well-established relationship with her psychiatrist though this is predominantly a virtual relationship and she has been looking to establish with a psychologist with specialty with food and eating disorder.   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  Neurology did not feel that this was causing any neurologic symptoms.  She is also had normal B12 and folate levels, and negative TTG, normal a.m. cortisol, and she is not iron deficient. --SIBO was considered but to this point she has not tried rifaximin because it had been cost prohibitive; we discussed how this may be present but does not explain the preponderance of her malnutrition or issues with eating and drinking. --We discussed her extremely restricted diet which includes a water/hydration fluid from [employer], bagels and croissants.  She has restricted most other foods and at this point basically does not know what she can eat. --Nausea with eating is a problem and Zofran has interfered with her migraines, Phenergan makes her very sleepy.  She tried Compazine and metoclopramide recently which also she seemed to tolerate but also had sleepiness. --Long discussion with the patient and her parents as well  as with her hospitalist Dr. Roda Shutters.  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  Taviana seems to be aware of this and certainly open to working with psychologist and dietary specialist.  We discussed the brain gut axis and at this point my recommendation is that she liberalize her diet and avoid no specific food groups.  She very much wants this nasoenteric feeding tube out, but understands that she will need to have meaningful caloric intake in order to be able to survive.  She is not interested in a PEG tube, which I certainly understand.  It should be noted that she continues to be very successful and enjoy her school.  She is no longer playing soccer.  To this point she is tolerating the tube feeds which I pointed out to her that this proves that her gut can receive different types of fats, proteins and carbohydrates. --I think that she could benefit from Marinol, but we will need to monitor for CNS depression which can occur because she is on zonisamide.  Dose titrate Marinol as tolerated.  This should help improve nausea and hopefully appetite. --Rifaximin can be considered in the future but right now I do not want to add too many medications at once.  We can discuss this further down the road. --We will try to briefly increase her tube feeding rate paying close attention to her electrolytes particularly phosphorus given risk of refeeding.  Appreciate nutrition's input. --Plan for inpatient psychiatry consultation.  Her fluoxetine will be continued when she can reliably take p.o. intake.  They are planning to establish with psychology and they found a practice called [facility].  I reviewed their website and this does look like a good choice.  Hopefully she can get in and be committed to her appointments.   2.  Migraines --finally well controlled with zonisamide, which we will plan to continue.  She was also getting steroid injections by her  headache specialist.  70 minutes total spent today including patient facing time, coordination of care, reviewing medical history/procedures/pertinent radiology studies, and documentation of the encounter.       LOS: 0 days   Beverley Fiedler  09/08/2021, 12:30 PM See Loretha Stapler, [facility], to contact our on call provider

## 2021-09-08 NOTE — Consult Note (Signed)
[facility] Face-to-Face Psychiatry Consult   Reason for Consult:  anorexia, GI recommend psych consult Referring Physician:  Dr. Erlinda Hong Patient Identification: Jaime Bauer MRN:  [id number] Principal Diagnosis: Intractable nausea and vomiting Diagnosis:  Principal Problem:   Intractable nausea and vomiting   Total Time spent with patient: 45 minutes  Subjective:   Jaime Bauer is a 21 y.o. female patient with psychiatric  history of anxiety, depression, OCD admitted with intractable nausea and vomiting who presented to the ED on 2/3. She was admitted to the internal medicine service the same day for continued work up including consult to GI service.  Per chart review, patient is well known to the GI service for nausea/vomiting/abdominal pain/intentional weight loss--work-up revealed that issues appear to be mostly functional in origin and there is suspicion that her mental health may be playing a role. Psychiatry was consulted for assistance.   Patient seen this afternoon in her room with both her mother and father present.  Patient consents for mother and father to remain in the room during evaluation.  Patient reports being treated for anxiety, depression and OCD by Dr. Floreen Comber (meet virtually) since approximately 2019.  Patient reports that her symptoms of OCD have significantly improved since receiving treatment.  Patient's obsessions centered around germs with the compulsion to constantly wash her hands.  Parents affirm that her OCD sx have improved.  Patient states that she has been on Prozac for "years" and that she has been at her current dose of Prozac for approximately 6 months.  Patient reports that her anxiety surrounding food has been ongoing for several years; although her anxiety surrounding food noticeably  increased in 2021 after she suffered a concussion.  Other stressors present around this time include her father being diagnosed with cancer..  Patient states that her psychiatrist  Dr. Corinna Capra is aware of "some of the issues" surrounding food; however, does not know everything.  Encouraged patient to share information with her psychiatrist so that he is aware of what is happening so he is able to best assist her. Pt verbalized understanding and states that she plans to schedule an appointment with him as soon as she is able after leaving the hospital  Patient describes her current mood is "not great".  She rates her mood at 4/10 (10 being the best).  She is unable to recall the last time her mood was better than a 5/10.  She states that she sleeps approximately 7 to 8 hours a night although describes her sleep quality is poor.  She reports decreased energy and decreased appetite.  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  She suicidal thoughts or previous suicide attempts.  She denies HI/AVH/thought insertion/thought withdrawal/thought broadcasting/ideas of reference/paranoia. No sx c/w manic or hypomanic episode.  Patient reports previous medication trials of Remeron (daytime somnolence and made it difficult for her to attend class), sertraline (describes what what be c/w  dehydration when she was playing soccer) and Prozac (no reported SE).  She states that she has tried gabapentin 100 mg 3 times daily in the past but that this has also been unsuccessful to increase her appetite or assist with anxiety.  No previous psychiatric hospitalizations.  No previous suicide attempts.  No history of violence.  Currently sees Dr. Floreen Comber as her psychiatrist virtually , he prescribes fluoxetine 60 mg daily.    She does not currently have a Social worker or  therapist.  She  is currently is attending [employer] in biology.  She reports that her grades are "good" .  She lives on campus alone.  She denies history of physical or sexual abuse, she denies access to a gun.  She denies recreational drug use, alcohol use,  tobacco use.  Family psychiatric history is significant for an uncle with bipolar disorder and substance use; an aunt with anxiety and OCD.  No history of attempted or completed suicides in the family.  No family history of substance use.  Discussed with patient and parents recommendation to continue Prozac 60 mg and to follow up with her outpatient psychiatrist as soon as possible.  Discussed that Zyprexa 2.5 mg nightly could be an option to add as an adjunct medication to  assist with her mood/anxiety and that it also has actions that can assist with  nausea and sleep and discussed that there has been evidence that it can help in anorexia .  Patient and family verbalized understanding.  Mother states that it was  recommended to try Marinol was made to assist with nausea, anxiety.  Discussed with parents and patient  that it would be best to try 1 thing at a time-parents and family and patient verbalized understanding and were in agreement.  Parents and family would like to try Marinol first to see how it works; however, are open to trying Zyprexa if further assistance is needed.  Mother states that she has had a referral from patient's pediatrician for a therapist who specializes in eating disorders after concussions but that she is also found a counseling company called [employer]  [facility] that also specializes in eating disorders.  Patient expresses desire to attend therapy after discharge.  HPI per primary:   Jaime Bauer is a 21 y.o. female with medical history significant of migraines, anxiety. Presenting poor PO intake and unintentional weight loss. She reports that she has chronic GI problems. Over the last 3 years, she has lost 30 lbs; with 10 lbs in the last month. Her w/u w/ GI has been unrevealing. She was seen by GI today. They recommended that she go to the ED to have a feeding tube placed and be monitored for refeeding syndrome. She reports that she has nausea and vomiting. She has a  global abdominal pain that seems to be worse in the LLQ. She reports a constant back and forth between constipation and diarrhea with her currently being in a cycle of constipation. She has tried zofran and phenergan for nausea. They are ineffective. She has tried remeron for appetite stimulation. It makes her too drowsy and doesn't seem to help. She denies any other aggravating or alleviating factors.   Past Psychiatric History: anxiety, depression, OCD  Risk to Self:  no Risk to Others:  no Prior Inpatient Therapy:  no Prior Outpatient Therapy:  yes  Past Medical History:  Past Medical History:  Diagnosis Date   Anxiety    Chronic headaches    Concussion    w/o LOC   Depression    History of multiple concussions    from soccer   Syncope     Past Surgical History:  Procedure Laterality Date   COLONOSCOPY  01/09/2021   NO PAST SURGERIES     UPPER GASTROINTESTINAL ENDOSCOPY  01/09/2021   2020- [facility]   Family History:  Family History  Problem Relation Age of Onset   Skin cancer Mother    Colon polyps Mother  Colon cancer Father 50   Colon polyps Father    Rectal cancer Father    Stroke Maternal Grandfather    Hypertension Paternal Uncle    Esophageal cancer Neg Hx    Inflammatory bowel disease Neg Hx    Liver disease Neg Hx    Pancreatic cancer Neg Hx    Stomach cancer Neg Hx    Family Psychiatric  History: Family psychiatric history is significant for an uncle with bipolar disorder and substance use; an aunt with anxiety and OCD.  No history of attempted or completed suicides in the family.  No family history of substance use. 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  Tobacco Use   Smoking status: Never   Smokeless tobacco: Never  Vaping Use   Vaping Use: Never used  Substance and Sexual Activity   Alcohol use: Never   Drug use: Never   Sexual activity: Yes    Birth control/protection: None  Other Topics Concern   Not on file  Social History Narrative   Right handed   Drinks caffeine   Two story home   Social Determinants of Health   Financial Resource Strain: Not on file  Food Insecurity: Not on file  Transportation Needs: Not on file  Physical Activity: Not on file  Stress: Not on file  Social Connections: Not on file   Additional Social History:    Allergies:  No Known Allergies  Labs:  Results for orders placed or performed during the hospital encounter of 09/07/21 (from the past 48 hour(s))  CBC with Differential     Status: None   Collection Time: 09/07/21 10:46 AM  Result Value Ref Range   WBC 4.6 4.0 - 10.5 K/uL   RBC 4.23 3.87 - 5.11 MIL/uL   Hemoglobin 12.7 12.0 - 15.0 g/dL   HCT 38.0 36.0 - 46.0 %   MCV 89.8 80.0 - 100.0 fL   MCH 30.0 26.0 - 34.0 pg   MCHC 33.4 30.0 - 36.0 g/dL   RDW 12.4 11.5 - 15.5 %   Platelets 271 150 - 400 K/uL   nRBC 0.0 0.0 - 0.2 %   Neutrophils Relative % 60 %   Neutro Abs 2.8 1.7 - 7.7 K/uL   Lymphocytes Relative 30 %   Lymphs Abs 1.4 0.7 - 4.0 K/uL   Monocytes Relative 7 %   Monocytes Absolute 0.3 0.1 - 1.0 K/uL   Eosinophils Relative 1 %   Eosinophils Absolute 0.0 0.0 - 0.5 K/uL   Basophils Relative 1 %   Basophils Absolute 0.0 0.0 - 0.1 K/uL   Immature Granulocytes 1 %   Abs Immature Granulocytes 0.03 0.00 - 0.07 K/uL    Comment: Performed at [facility], [place] [address], [place], [place] [address]  Comprehensive metabolic panel     Status: Abnormal   Collection Time: 09/07/21 10:46 AM  Result Value Ref Range   Sodium 137 135 - 145 mmol/L   Potassium 3.8 3.5 - 5.1 mmol/L   Chloride 107 98 - 111  mmol/L  CO2 23 22 - 32 mmol/L   Glucose, Bld 92 70 - 99 mg/dL    Comment: Glucose reference range applies only to samples taken after fasting for at least 8 hours.   BUN 13 6 - 20 mg/dL   Creatinine, Ser 0.78 0.44 - 1.00 mg/dL   Calcium 9.5 8.9 - 10.3 mg/dL   Total Protein 7.0 6.5 - 8.1 g/dL   Albumin 4.4 3.5 - 5.0 g/dL   AST 13 (L) 15 - 41 U/L   ALT 14 0 - 44 U/L   Alkaline Phosphatase 44 38 - 126 U/L   Total Bilirubin 0.8 0.3 - 1.2 mg/dL   GFR, Estimated >60 >60 mL/min    Comment: (NOTE) Calculated using the CKD-EPI Creatinine Equation (2021)    Anion gap 7 5 - 15    Comment: Performed at [facility], [place] [address] [place], [place] [address]  Lipase, blood     Status: None   Collection Time: 09/07/21 10:46 AM  Result Value Ref Range   Lipase 30 11 - 51 U/L    Comment: Performed at [facility], [place] [address], [place], [place] [address]  Magnesium     Status: None   Collection Time: 09/07/21 10:46 AM  Result Value Ref Range   Magnesium 2.1 1.7 - 2.4 mg/dL    Comment: Performed at [facility], [place] [address], [place], [place] [address]  Phosphorus     Status: None   Collection Time: 09/07/21 10:46 AM  Result Value Ref Range   Phosphorus 3.6 2.5 - 4.6 mg/dL    Comment: Performed at [facility], [place] [address], [place], [place] [address]  Urinalysis, Routine w reflex microscopic Urine, Clean Catch     Status: Abnormal   Collection Time: 09/07/21 10:47 AM  Result Value Ref Range   Color, Urine YELLOW YELLOW   APPearance CLEAR CLEAR   Specific Gravity, Urine 1.025 1.005 - 1.030   pH 6.0 5.0 - 8.0   Glucose, UA NEGATIVE NEGATIVE mg/dL   Hgb urine dipstick SMALL (A) NEGATIVE   Bilirubin Urine NEGATIVE NEGATIVE   Ketones, ur NEGATIVE NEGATIVE mg/dL   Protein, ur NEGATIVE NEGATIVE mg/dL   Nitrite NEGATIVE NEGATIVE   Leukocytes,Ua NEGATIVE NEGATIVE   RBC / HPF 0-5 0 - 5 RBC/hpf   WBC, UA 0-5 0 -  5 WBC/hpf   Bacteria, UA NONE SEEN NONE SEEN   Squamous Epithelial / LPF 0-5 0 - 5   Mucus PRESENT     Comment: Performed at [facility], [place] [address], [place], [place] [address]  Pregnancy, urine     Status: None   Collection Time: 09/07/21 10:47 AM  Result Value Ref Range   Preg Test, Ur NEGATIVE NEGATIVE    Comment:        THE SENSITIVITY OF THIS METHODOLOGY IS >20 mIU/mL. Performed at [facility], [place] [address] [place], [place] [address]   Resp Panel by RT-PCR (Flu A&B, Covid) Nasopharyngeal Swab     Status: None   Collection Time: 09/07/21 11:34 AM   Specimen: Nasopharyngeal Swab; Nasopharyngeal(NP) swabs in vial transport medium  Result Value Ref Range   SARS Coronavirus 2 by RT PCR NEGATIVE NEGATIVE    Comment: (NOTE) SARS-CoV-2 target nucleic acids are NOT DETECTED.  The SARS-CoV-2 RNA is generally detectable in upper respiratory specimens during the acute phase of infection. The lowest concentration of SARS-CoV-2 viral copies this assay can  detect is 138 copies/mL. A negative result does not preclude SARS-Cov-2 infection and should not be used as the sole basis for treatment or other patient management decisions. A negative result may occur with  improper specimen collection/handling, submission of specimen other than nasopharyngeal swab, presence of viral mutation(s) within the areas targeted by this assay, and inadequate number of viral copies(<138 copies/mL). A negative result must be combined with clinical observations, patient history, and epidemiological information. The expected result is Negative.  Fact Sheet for Patients:  EntrepreneurPulse.com.au  Fact Sheet for Healthcare Providers:  IncredibleEmployment.be  This test is no t yet approved or cleared by the [place] FDA and  has been authorized for detection and/or diagnosis of SARS-CoV-2 by FDA under an Emergency Use  Authorization (EUA). This EUA will remain  in effect (meaning this test can be used) for the duration of the COVID-19 declaration under Section 564(b)(1) of the Act, 21 U.S.C.section 360bbb-3(b)(1), unless the authorization is terminated  or revoked sooner.       Influenza A by PCR NEGATIVE NEGATIVE   Influenza B by PCR NEGATIVE NEGATIVE    Comment: (NOTE) The Xpert Xpress SARS-CoV-2/FLU/RSV plus assay is intended as an aid in the diagnosis of influenza from Nasopharyngeal swab specimens and should not be used as a sole basis for treatment. Nasal washings and aspirates are unacceptable for Xpert Xpress SARS-CoV-2/FLU/RSV testing.  Fact Sheet for Patients: EntrepreneurPulse.com.au  Fact Sheet for Healthcare Providers: IncredibleEmployment.be  This test is not yet approved or cleared by the [place] FDA and has been authorized for detection and/or diagnosis of SARS-CoV-2 by FDA under an Emergency Use Authorization (EUA). This EUA will remain in effect (meaning this test can be used) for the duration of the COVID-19 declaration under Section 564(b)(1) of the Act, 21 U.S.C. section 360bbb-3(b)(1), unless the authorization is terminated or revoked.  Performed at [facility], [place] [address], [place], [place] [address]   Magnesium     Status: None   Collection Time: 09/07/21  7:11 PM  Result Value Ref Range   Magnesium 2.0 1.7 - 2.4 mg/dL    Comment: Performed at [facility], [place] [address] [place], [place] [address]  Phosphorus     Status: Abnormal   Collection Time: 09/07/21  7:11 PM  Result Value Ref Range   Phosphorus 2.1 (L) 2.5 - 4.6 mg/dL    Comment: Performed at [facility], [place] [address] [place], [place] [address]  Potassium     Status: Abnormal   Collection Time: 09/07/21  7:11 PM  Result Value Ref Range   Potassium 3.2 (L) 3.5 - 5.1 mmol/L    Comment: Performed at  [facility], [place] [address], [place], [place] [address]  CBG monitoring, ED     Status: Abnormal   Collection Time: 09/07/21  8:04 PM  Result Value Ref Range   Glucose-Capillary 104 (H) 70 - 99 mg/dL    Comment: Glucose reference range applies only to samples taken after fasting for at least 8 hours.   Comment 1 Notify RN    Comment 2 Document in Chart   Glucose, capillary     Status: Abnormal   Collection Time: 09/08/21 12:56 AM  Result Value Ref Range   Glucose-Capillary 112 (H) 70 - 99 mg/dL    Comment: Glucose reference range applies only to samples taken after fasting for at least 8 hours.   Comment 1 Notify RN    Comment 2  Document in Chart   Glucose, capillary     Status: Abnormal   Collection Time: 09/08/21  5:03 AM  Result Value Ref Range   Glucose-Capillary 108 (H) 70 - 99 mg/dL    Comment: Glucose reference range applies only to samples taken after fasting for at least 8 hours.  Glucose, capillary     Status: Abnormal   Collection Time: 09/08/21  7:42 AM  Result Value Ref Range   Glucose-Capillary 104 (H) 70 - 99 mg/dL    Comment: Glucose reference range applies only to samples taken after fasting for at least 8 hours.  Magnesium     Status: None   Collection Time: 09/08/21  7:56 AM  Result Value Ref Range   Magnesium 2.1 1.7 - 2.4 mg/dL    Comment: Performed at [facility], [place] [address], [place], [place] [address]  Phosphorus     Status: None   Collection Time: 09/08/21  7:56 AM  Result Value Ref Range   Phosphorus 4.6 2.5 - 4.6 mg/dL    Comment: Performed at [facility], [place] [address], [place], [place] [address]  Folate, serum, performed at [facility] lab     Status: None   Collection Time: 09/08/21  7:56 AM  Result Value Ref Range   Folate 20.6 >5.9 ng/mL    Comment: Performed at [facility], [place] [address] [place], [place] [address]  HIV Antibody (routine testing w rflx)      Status: None   Collection Time: 09/08/21  7:56 AM  Result Value Ref Range   HIV Screen 4th Generation wRfx Non Reactive Non Reactive    Comment: Performed at [facility], [place] [address], [place], [place] [address]  Comprehensive metabolic panel     Status: Abnormal   Collection Time: 09/08/21  7:56 AM  Result Value Ref Range   Sodium 137 135 - 145 mmol/L   Potassium 3.5 3.5 - 5.1 mmol/L   Chloride 109 98 - 111 mmol/L   CO2 19 (L) 22 - 32 mmol/L   Glucose, Bld 107 (H) 70 - 99 mg/dL    Comment: Glucose reference range applies only to samples taken after fasting for at least 8 hours.   BUN 14 6 - 20 mg/dL   Creatinine, Ser 0.59 0.44 - 1.00 mg/dL   Calcium 8.9 8.9 - 10.3 mg/dL   Total Protein 6.4 (L) 6.5 - 8.1 g/dL   Albumin 4.1 3.5 - 5.0 g/dL   AST 12 (L) 15 - 41 U/L   ALT 15 0 - 44 U/L   Alkaline Phosphatase 41 38 - 126 U/L   Total Bilirubin 0.6 0.3 - 1.2 mg/dL   GFR, Estimated >60 >60 mL/min    Comment: (NOTE) Calculated using the CKD-EPI Creatinine Equation (2021)    Anion gap 9 5 - 15    Comment: Performed at [facility], [place] [address] [place], [place] [address]  CBC     Status: Abnormal   Collection Time: 09/08/21  7:56 AM  Result Value Ref Range   WBC 9.9 4.0 - 10.5 K/uL   RBC 3.69 (L) 3.87 - 5.11 MIL/uL   Hemoglobin 11.2 (L) 12.0 - 15.0 g/dL   HCT 33.4 (L) 36.0 - 46.0 %   MCV 90.5 80.0 - 100.0 fL   MCH 30.4 26.0 - 34.0 pg   MCHC 33.5 30.0 - 36.0 g/dL   RDW 12.5 11.5 - 15.5 %   Platelets  265 150 - 400 K/uL   nRBC 0.0 0.0 - 0.2 %    Comment: Performed at [facility], [place] [address], [place], [place] [address]  Glucose, capillary     Status: None   Collection Time: 09/08/21 11:55 AM  Result Value Ref Range   Glucose-Capillary 98 70 - 99 mg/dL    Comment: Glucose reference range applies only to samples taken after fasting for at least 8 hours.    Current Facility-Administered Medications  Medication Dose Route  Frequency Provider Last Rate Last Admin   dronabinol (MARINOL) capsule 2.5 mg  2.5 mg Oral BID Marcelle Overlie, MD       Derrill Memo ON 09/09/2021] feeding supplement (KATE FARMS STANDARD 1.4) liquid 480 mL  480 mL Per Tube Daily Florencia Reasons, MD       FLUoxetine (PROZAC) capsule 60 mg  60 mg Oral q morning Norins, Heinz Knuckles, MD       free water 100 mL  100 mL Per Tube Q4H Kyle, Tyrone A, DO   100 mL at 09/08/21 1147   metoCLOPramide (REGLAN) injection 5 mg  5 mg Intravenous Q6H PRN Norins, Heinz Knuckles, MD   5 mg at 09/07/21 2311   phenol (CHLORASEPTIC) mouth spray 1 spray  1 spray Mouth/Throat PRN Cherylann Ratel A, DO   1 spray at 09/08/21 0047   prochlorperazine (COMPAZINE) injection 10 mg  10 mg Intravenous Q6H PRN Marylyn Ishihara, Tyrone A, DO   10 mg at 09/08/21 0457   zonisamide (ZONEGRAN) capsule 150 mg  150 mg Oral QHS Norins, Heinz Knuckles, MD        Musculoskeletal: Strength & Muscle Tone:  did not formally test, patient laying in bed with NG tube in place Viola:  did not formally test, patient laying in bed with NG tube in place Patient leans:  did not formally test, patient laying in bed with NG tube in place            Psychiatric Specialty Exam:  Presentation  General Appearance: Appropriate for Environment; Casual (laying in bed, ill appearing, thin)  Eye Contact:Other (comment) (fleeting, patient intermittently closes eyes for extended periods of time but makes good eye contact when they are open)  Speech:Clear and Coherent; Slow  Speech Volume:Decreased  Handedness:No data recorded  Mood and Affect  Mood:-- ("not great"; rates 4/10 (10 being the best))  Affect:Constricted (appropriately reactive)   Thought Process  Thought Processes:Coherent; Goal Directed; Linear  Descriptions of Associations:Intact  Orientation:Full (Time, Place and Person)  Thought Content:WDL  History of Schizophrenia/Schizoaffective disorder:No data recorded Duration of Psychotic Symptoms:No data  recorded Hallucinations:Hallucinations: None  Ideas of Reference:None  Suicidal Thoughts:Suicidal Thoughts: No  Homicidal Thoughts:Homicidal Thoughts: No   Sensorium  Memory:Immediate Good; Recent Good; Remote Good  Judgment:Fair  Insight:Good   Executive Functions  Concentration:Good  Attention Span:Good  [place] of Knowledge:Good  Language:No data recorded  Psychomotor Activity  Psychomotor Activity:Psychomotor Activity: Decreased   Assets  Assets:Communication Skills; Desire for Improvement; Financial Resources/Insurance; Housing; Resilience; Physical Health; Social Support; Vocational/Educational; Talents/Skills   Sleep  Sleep:Sleep: Poor (sleeps 7-8hours although reports poor quality sleep)   Physical Exam: Physical Exam Constitutional:      Appearance: Normal appearance. She is ill-appearing.     Comments: Thin; NG tube in place  HENT:     Head: Normocephalic and atraumatic.  Pulmonary:     Effort: Pulmonary effort is normal.  Neurological:     Mental Status: She is  alert.  Psychiatric:        Thought Content: Thought content normal.   ROS Blood pressure 108/60, pulse (!) 116, temperature 99.3 F (37.4 C), temperature source Oral, resp. rate 18, height 5' 8.7" (1.745 m), weight 49.7 kg, SpO2 100 %. Body mass index is 16.31 kg/m.  Treatment Plan Summary: Jaime Bauer is a 21 y.o. female patient with psychiatric  history of anxiety, depression, OCD admitted with intractable nausea and vomiting who presented to the ED on 2/3. She was admitted to the internal medicine service the same day for continued work up including consult to GI service.  Per chart review, patient is well known to the GI service for nausea/vomiting/abdominal pain/intentional weight loss--work-up revealed that issues appear to be mostly functional in origin and there is suspicion that her mental health may be playing a role. Psychiatry was consulted for assistance.    Patient denies SI/HI/AVH psychotic or manic symptoms today.  Patient does discuss extreme anxiety related to food and her ability to tolerate it she often feels nauseous and vomits.  Discussed recommendation to possibly start Zyprexa 2.5 mg nightly and continuing Prozac.  Marinol was previously recommended and patient and parents would like to try Marinol first to see how it works; however, are open to trying Zyprexa if further assistance is needed. Zyprexa can act as adjunct to prozac and acts on serotonin and dopamine receptors that give in anti-emetic properties.   If it is decided to trial zyprexa while in the hospital, would recommend to obtain EKG first to monitor qtc.    Recommendations -continue prozac 60 mg daily -consider 2.5 mg zyprexa if marinol ineffective --obtain ekg prior to trial to monitor qtc -recommend following up with therapist/counselor who specializes in eating disorders-mother states that she has [facility] and had a referral placed by pediatrician -recommend to consult TOC/SW for additional counselors/therapists who specialize in eating disorders in case current plans fall through  Disposition: per primary team  Psychiatry will sign off at this time. Recommendations communicated to Dr. Erlinda Hong via epic secure chat. Please re-consult if further assistance is needed.  Ival Bible, MD 09/08/2021 3:17 PM

## 2021-09-08 NOTE — Progress Notes (Signed)
°   09/08/21 1300  Assess: MEWS Score  Temp 99.3 F (37.4 C)  BP 108/60  Pulse Rate (!) 116  Resp 18  Level of Consciousness Alert  SpO2 100 %  O2 Device Room Air  Assess: MEWS Score  MEWS Temp 0  MEWS Systolic 0  MEWS Pulse 2  MEWS RR 0  MEWS LOC 0  MEWS Score 2  MEWS Score Color Yellow  Assess: if the MEWS score is Yellow or Red  Were vital signs taken at a resting state? Yes  Focused Assessment No change from prior assessment  Does the patient meet 2 or more of the SIRS criteria? No  MEWS guidelines implemented *See Row Information* Yes  Treat  MEWS Interventions Administered scheduled meds/treatments;Administered prn meds/treatments  Pain Scale 0-10  Pain Score 3  Pain Type Acute pain  Pain Location Abdomen  Pain Orientation Right;Left;Upper;Lower  Pain Descriptors / Indicators Tender  Pain Frequency Occasional  Pain Intervention(s) Repositioned  Multiple Pain Sites No  Complains of Nausea /  Vomiting  Nausea relieved by Antiemetic  Patients response to intervention Effective  Take Vital Signs  Increase Vital Sign Frequency  Yellow: Q 2hr X 2 then Q 4hr X 2, if remains yellow, continue Q 4hrs  Escalate  MEWS: Escalate Yellow: discuss with charge nurse/RN and consider discussing with provider and RRT  Notify: Charge Nurse/RN  Name of Charge Nurse/RN Notified Fish farm manager RN  Date Charge Nurse/RN Notified 09/08/21  Time Charge Nurse/RN Notified 1345  Document  Patient Outcome Stabilized after interventions  Progress note created (see row info) Yes  Assess: SIRS CRITERIA  SIRS Temperature  0  SIRS Pulse 1  SIRS Respirations  0  SIRS WBC 0  SIRS Score Sum  1    Pt tachycardic, triggering yellow MEWS protocol. No change from previous assessment. Pt in no acute distress. Will continue to monitor.  MEWS Guidelines - (patients age 47 and over)

## 2021-09-08 NOTE — Progress Notes (Signed)
°  Progress Note   PatientDariana Garbett OFB:[id number] DOB: 2001/02/06 DOA: 09/07/2021     0 DOS: the patient was seen and examined on 09/08/2021   Brief hospital course: Letty Salvi is a 21 y.o. female patient with psychiatric h/o  anxiety, depression, OCD , migraines, chronic abdominal pain , intermittent nausea and vomiting ,multiple GI work-up which were all unrevealing, she is sent to the ED by GI doctor due to anorexia, unintentional weight loss and consider tube feeding.  Assessment and Plan: Anorexia- (present on admission) With unintentional weight loss, intermittent nausea and vomiting, generalized abdominal pain, more on left lower quadrant, with unrevealing GI work-up in the past. She is started on tube feeds, seen by GI, she tolerated tube feeds at 40 cc/h, but does not desire to continue tube feeds due to tube irritation.  She agreed to start oral nutrition, trial of Marinol, as needed antiemetics Tube feeds discontinued in the evening on  2/ 4.   She is also seen by psychiatry who recommended trial of Zyprexa 2.5 mg if Marinol ineffective,  Need to monitor QTc if patient is started on Zyprexa.  Psychiatry recommend continue Prozac 60 mg daily  recommend following up with therapist/counselor who specializes in eating disorders    Underweight Body mass index is 16.31 kg/m.      Subjective: She is pleasant, currently no nausea no vomiting, reports some mild abdominal pain more on the left lower quadrant, family at bedside GI at bedside as well  Physical Exam: Vitals:   09/08/21 0645 09/08/21 1030 09/08/21 1300 09/08/21 1519  BP: 109/66 109/69 108/60 (!) 103/56  Pulse: (!) 113 (!) 108 (!) 116 (!) 108  Resp: 16 16 18 18   Temp: 98 F (36.7 C) 97.9 F (36.6 C) 99.3 F (37.4 C) 98.3 F (36.8 C)  TempSrc:  Oral Oral Oral  SpO2: 98% 98% 100% 99%  Weight:      Height:       General:  NAD, underweight  Cardiovascular: RRR Respiratory: CTABL Abdomen:  Soft/ND/NT, positive BS Musculoskeletal: No Edema Neuro: alert, oriented     Data Reviewed: Monitor electrolyte prevent refeeding syndrome Family Communication: Parent at bedside  Disposition: Status is: Inpatient   Planned Discharge Destination: Home      Author: , MD PhD FACP 09/08/2021 7:13 PM  For on call review www.11/06/2021.

## 2021-09-08 NOTE — Discharge Instructions (Addendum)
Mental Health Services.  ADS (Alcohol and Drug Services) [phone] [address] [place], [address] ?ETOH Abuse ?Substance Abuse Medicaid Self Pay  [facility] [phone]) [phone] [address] [place], [address] ?[facility] Medicaid Self Pay  [facility] [phone] Ext. 2 [address] [place], [place] [address] ?Psychotherapy ?Life Coaching ?Body work and Energy managermovement Sliding Scale BCBS Medicaid State Employees  Tri-Care  [facility] [phone] [address] [place], [address] ?Substance Abuse and housing services for those seeking recovery [facility] Self Pay [facility] ONLY  [facility] [phone] Ext. 111 [address] [place], [address] ?Psychological Evaluation ?Individual/Marital/Family Counseling ?Crisis Intervention/Conflict Resolution ?Stress Reduction and Management ?Career Counseling ?Parenting Issues ?Psycho-Education Groups Aetna BCBS Cigna Humana Medcost Medicaid  Medicare [place] Health Choice Self-Pay State Employees Tri- Care [facility]  [facility] [phone]) [phone] [address] [place], [address] ?Outpatient Substance Abuse Private Insurance [facility] LL[facility]edicare A&B [facility]  [facility] [phone] [address] [place], [address] ?Individual and Family Counseling Ulla GalloAetna BCBS [facility] IncCigna Medicaid Medcost Tri-Care Self Pay  [facility] [phone] [address]  [place], [id number] ?Services to Children, Adults, and Families ?Physical Health ?Mental Health ?Those with Intellectual and Developmental Disabilities Medicaid Medicare Private Insurance  [facility] [phone] [address] [place], [place] [address] ?Domestic violence shelter ?Consumer credit counseling services ?Employee assistance  program ?Group counseling ?Individual & family counseling ?Female batterers program ?Victim services Medicaid Sliding Scale No Insurance Required  [facility] [phone] [address] [place], [address] ?Drug/Alcohol Abuse ?Residential Treatment ?Outpatient Treatment ?Family Programs Express ScriptsPrivate Pay Insurance Only  [facility] [phone] [address] [place], [address] ?Individual Therapy ?Individual Workshops ?Dialectical Behavioral Therapy Skills [facility]Medicaid Self-Pay  [facility] [phone] [address] [place], [address] ?9 bed therapeutic residence for adults who have a primary diagnosis of mental illness/or emotional disorder Medicaid Marrian SalvageOther Self-pay  [facility] [phone]) [phone] [address] [place], [place] [address] ?Trauma Informed Services ?Outpatient Counseling ?Individual, Couples, and Family Counseling ?Group Counseling ?Peer Support Services (Group and Individual) ?Victim Advocacy ?Case Management No Insurance Sliding Scale Fee Free or Reduced Costs Accept some insurance plans   [facility] [place] [phone]  ?Wellness and recovery classes ?Compeer program ?Peer support services ?Support groups Universal HealthFree Services  [facility] [phone] [facility]  [address] [place], [place] [address] ?Open Access ?Outpatient Therapy ?Assertive Advertising copywriterCommunity Treatment Team (ACTT) ?Medication management No Insurance Sliding Scale Fee Accepts Aetna, Cigna, StowHumana, [place]icaid, Medicare, [place] Health Choice, Self Pay, Tri-Care, [facility]United Health Care  National Alliance on Mental Illness- [place] [phone] N/A ?Wellness Classes ?Support Groups [facility] Services  [facility] [phone]) [phone] [address] [place], [address] ?Individual Therapy Sliding Scale Medicare Most Insurance Plans  [facility] [phone]) [phone] [address] [place], [address]  ?Individual Counseling for Christian Women Significantly Reduced Fee  [facility] [phone]) [phone] [address]  [place], [address] ?Mental Evaluations ?Individual Therapy ?Opioid Addiction/Long-Term Detox ?Marriage and Family Counseling ?Other addictions (sex, gambling, internet, and smoking) ?Drug Detox ?DWI Assessments/Treatment ?First Offender's Program ?UA Testing ?D.O.T Assessments  Private Insurance Self Pay  [facility] [phone] [address]  [place], [address] ?Psycho-social Rehabilitation clubhouse ?Individual and group therapy ?Clinical assessments Free Services  [facility] [phone] [address] [place], [address] ?Outpatient Therapy ?Comprehensive Clinical Assessments ?Medication Management ?Community Support Team ?Psycho-Social Rehab Aetna BCBS Humana [place] Medicare [place] Health Choice [facility]-Care  [facility] [phone] [address] [place], [address] ?Individual and Family Counseling ?Community Support Team ?Diagnostic Assessment ?Comprehensive Clinical Assessment ?Mental Health Assessment ?Medication Management Medicaid  [facility] [phone] [address] [place], [address] ?Teen and Young Adult Alcohol and Drug Treatment Self Pay  [facility] [phone] Mobile Unit ?[facility]  [facility] [phone] [address] [place], [address] ?Various Services for Mental Health Needs Medicaid [facility] [phone]  [address] [facility]  [address] [place], [address] ?Adolescents Substance Abuse Program for Males and Females, age range= 98-17 y/o Please phone for fees  [facility] See to right --------> * Additional Locations: [facility] [address] [place], [address] [phone]   [facility] [address] [place], [address]  [phone]   [facility] [address]  [place], [address] [phone]    ?Adolescents Substance Abuse Program for Males and Females, age range= 85-17 y/o Please phone for fees   Family Services Crisis Line [phone] Mental health and domestic violence crisis line. "Family Service" is in [place]; "Family Services" is in [place]; number listed is DV crisis #; general # is [phone]  [facility] [phone]- [phone] Confidential mental health crisis line.   National Suicide Prevention LifeLine [phone] If youre thinking about suicide or youre worried about a friend or loved one, this network is available 24/7 and will connect you with a trained crisis worker who will listen, provide support and share resources that may be helpful.  Number just changed to 988; check with Rosey Bath about roll-out, though.  SAMHSA National Help Line ([phone] [facility] Danna Hefty is a free, confidential, 24/7, 365-day-a-year treatment referral and information service (in [place] and [place]) for individuals and families facing mental and/or substance use disorders.  [facility] [phone] [facility] help line is available day or night to connect people with providers in central [place]. The have licensed clinicians on-call who will assess your situation and help you find affordable treatment options in your area.   [facility] Alternatives ([phone] This is a local organization that provides 24/7 mobile crisis response services, providing immediate evaluation, triage, and access to emergency mental health services, treatment and support. Their services are free of charge, and their crisis teams are able to respond face to face within 2 hours of contact.   [employer] 2-1-1 from any phone in [place]. Information referral service that helps locate local community resources and social services for those in need,  from housing to clothing to medical treatment.  R.J. Madelaine Etienne is one of  three state operated [place] Alcohol and Drug Abuse Cabin crew (ADATCs)   [address] [place], [place]. [address] Telephone: [phone] Referral Fax: [phone] Specifically designed to provide inpatient treatment, psychiatric stabilization and medical detoxification for individuals with substance use and other co-occurring mental health diagnoses to prepare for ongoing community based treatment and recovery.  [place] DHHS: [facility]   RJ Blackley specifically services [place].  Bernita Buffy serves [place] and WB Yetta Barre serves [place].  Goes by county of residence, so most typically we would  refer to [facility], but occasionally the other two.  Let me know if you need contact info.

## 2021-09-08 NOTE — TOC Transition Note (Signed)
Transition of Care [facility]) - CM/SW Discharge Note   Patient Details  Name: Jaime Bauer MRN: [id number] Date of Birth: Mar 19, 2001  [facility]) CM/SW Contact:  Darleene Cleaver, LCSW Phone Number: 09/08/2021, 4:27 PM   Clinical Narrative:     CSW was informed that patient's family would like information regarding outpatient psych care, specifically regarding eating disorders.  CSW attached a list of outpatient psych services on patient's AVS.     Final next level of care: Home/Self Care Barriers to Discharge: Barriers Resolved   Patient Goals and CMS Choice Patient states their goals for this hospitalization and ongoing recovery are:: To return back home.      Discharge Placement                       Discharge Plan and Services                                     Social Determinants of Health (SDOH) Interventions     Readmission Risk Interventions No flowsheet data found.

## 2021-09-08 NOTE — Assessment & Plan Note (Addendum)
With unintentional weight loss, intermittent nausea and vomiting, generalized abdominal pain, more on left lower quadrant, with unrevealing GI work-up in the past. She is started on tube feeds, seen by GI, she tolerated tube feeds at 40 cc/h, but does not desire to continue tube feeds due to tube irritation.  She agreed to start oral nutrition, trial of Marinol, as needed antiemetics Tube feeds discontinued in the evening on  2/ 4.   She is also seen by psychiatry who recommended trial of Zyprexa 2.5 mg if Marinol ineffective,  Need to monitor QTc if patient is started on Zyprexa.  Psychiatry recommend continue Prozac 60 mg daily  recommend following up with therapist/counselor who specializes in eating disorders

## 2021-09-08 NOTE — Progress Notes (Addendum)
Nutrition Follow-up  DOCUMENTATION CODES:   Underweight  INTERVENTION:   Continue to monitor magnesium, potassium, and phosphorus BID for at least 3 days, MD to replete as needed, as pt is at risk for refeeding syndrome given likely malnutrition, hx of ARFID.  -Continue TF via NGT: Kate Farms 1.4 @ 30m/hr  1032mfree water Q4H Provides 1344 kcals, 59 grams protein, 69137mree water (1291m24mtal free water with flushes)  Goal TF rate: Kate Farms 1.4 @ 60ml59m40ml/28m Goal TF will provide 2016 kcals, 88 grams protein, 1036ml f47mwater  -45ml Pr43mrce TF daily via NGT to provide additional 40 kcals and 11 grams protein  -continue to recommend vitamin D supplementation due to low serum vitamin D. -continue to recommend 100 mg thiamine/day.  -continue to recommend checking serum folate, thiamine, vitamin A, vitamin B6.   NUTRITION DIAGNOSIS:   Inadequate oral intake related to nausea, vomiting, poor appetite as evidenced by per patient/family report.  ongoing  GOAL:   Patient will meet greater than or equal to 90% of their needs  Addressing with TF  MONITOR:   Diet advancement, TF tolerance, Labs, Weight trends, I & O's  REASON FOR ASSESSMENT:   Consult Assessment of nutrition requirement/status, Enteral/tube feeding initiation and management  ASSESSMENT:   21 y.o. 48male with medical history significant of migraines, anxiety, and depression. She presented to the ED due to poor PO intake, N/V, abdominal pain, combination of constipation and diarrhea, and unintentional weight loss. At home Zofran and Phenergan were ineffective in controlling nausea. She reports that she has chronic GI problems. Over the last 3 years, she has lost 30 lbs; with 10 lbs in the last month. Work-up by GI has been unrevealing. Outpatient GI provider recommended patient present to the ED for feeding tube placement and monitoring for refeeding syndrome. She has been trialed on Remeron for  appetite stimulation but it makes her drowsy and she feels it has been ineffective.  NGT successfully placed yesterday, 09/07/21, with tube tip noted in stomach. Pt unfortunately c/o being miserable with NGT and requesting it be removed. TF initiated yesterday -- Kate FarDillard Essex0ml/hr 75mith no tolerance issues noted. MD adjusted rate to 40ml/hr t25m which provides 1344 kcals, 59 grams protein, 691ml free 50mr. This meets 72% and 62% of minimum estimated calorie and protein needs, respectively. If pt tolerates increased rate, recommend continuing to increase TF rate until 100% of pt's needs are met -- recommendations provided above. Note pt is at risk for refeeding syndrome.   Current weight: 49.7 kg Admit weight: 49.1 kg   Medications:  dronabinol  2.5 mg Oral BID AC   [START ON 09/09/2021] feeding supplement (KATE FARMS STANDARD 1.4)  480 mL Per Tube Daily   FLUoxetine  60 mg Oral q morning   free water  100 mL Per Tube Q4H   zonisamide  150 mg Oral QHS   Labs: Recent Labs  Lab 09/07/21 1046 09/07/21 1911 09/08/21 0756  NA 137  --  137  K 3.8 3.2* 3.5  CL 107  --  109  CO2 23  --  19*  BUN 13  --  14  CREATININE 0.78  --  0.59  CALCIUM 9.5  --  8.9  MG 2.1 2.0 2.1  PHOS 3.6 2.1* 4.6  GLUCOSE 92  --  107*  CBGs: 98-112 x24 hours   NUTRITION - FOCUSED PHYSICAL EXAM: Unable to perform at this time as RD is working remotely. Will attempt  at follow-up.   Diet Order:   Diet Order     None       EDUCATION NEEDS:   Not appropriate for education at this time  Skin:  Skin Assessment: Reviewed RN Assessment  Last BM:  2/4  Height:   Ht Readings from Last 1 Encounters:  09/07/21 5' 8.7" (1.745 m)    Weight:   Wt Readings from Last 1 Encounters:  09/08/21 49.7 kg    Ideal Body Weight:  65.9 kg  BMI:  Body mass index is 16.31 kg/m.  Estimated Nutritional Needs:   Kcal:  1855-2150 kcal  Protein:  95-110 grams  Fluid:  >/= 2.2 L/day     Theone Stanley.,  MS, RD, LDN (she/her/hers) RD pager number and weekend/on-call pager number located in [place].

## 2021-09-08 NOTE — Progress Notes (Signed)
Pt with order to D/C NGT - attempted to D/C 10 Fr NGT with much resistance met, RRT nurse to unit with successful removal of NGT noted that Pt will need 8 Fr or pediatric NGT in future.

## 2021-09-08 NOTE — Hospital Course (Signed)
Jaime Bauer is a 21 y.o. female patient with psychiatric h/o  anxiety, depression, OCD , migraines, chronic abdominal pain , intermittent nausea and vomiting ,multiple GI work-up which were all unrevealing, she is sent to the ED by GI doctor due to anorexia, unintentional weight loss and consider tube feeding.

## 2021-09-09 DIAGNOSIS — F5082 Avoidant/restrictive food intake disorder: Secondary | ICD-10-CM

## 2021-09-09 DIAGNOSIS — R630 Anorexia: Secondary | ICD-10-CM

## 2021-09-09 DIAGNOSIS — E46 Unspecified protein-calorie malnutrition: Secondary | ICD-10-CM | POA: Diagnosis not present

## 2021-09-09 DIAGNOSIS — R634 Abnormal weight loss: Secondary | ICD-10-CM

## 2021-09-09 DIAGNOSIS — R112 Nausea with vomiting, unspecified: Secondary | ICD-10-CM | POA: Diagnosis not present

## 2021-09-09 LAB — BASIC METABOLIC PANEL
Anion gap: 7 (ref 5–15)
BUN: 11 mg/dL (ref 6–20)
CO2: 23 mmol/L (ref 22–32)
Calcium: 8.9 mg/dL (ref 8.9–10.3)
Chloride: 107 mmol/L (ref 98–111)
Creatinine, Ser: 0.64 mg/dL (ref 0.44–1.00)
GFR, Estimated: >60 mL/min (ref >60)
Glucose, Bld: 95 mg/dL (ref 70–99)
Potassium: 3.4 mmol/L — ABNORMAL LOW (ref 3.5–5.1)
Sodium: 137 mmol/L (ref 135–145)

## 2021-09-09 LAB — MAGNESIUM: Magnesium: 1.9 mg/dL (ref 1.7–2.4)

## 2021-09-09 LAB — GLUCOSE, CAPILLARY
Glucose-Capillary: 103 mg/dL — ABNORMAL HIGH (ref 70–99)
Glucose-Capillary: 81 mg/dL (ref 70–99)
Glucose-Capillary: 120 mg/dL — ABNORMAL HIGH (ref 70–99)

## 2021-09-09 LAB — PHOSPHORUS: Phosphorus: 3.8 mg/dL (ref 2.5–4.6)

## 2021-09-09 LAB — VITAMIN B12: Vitamin B-12: 512 pg/mL (ref 180–914)

## 2021-09-09 MED ORDER — POTASSIUM CHLORIDE CRYS ER 20 MEQ PO TBCR
40.0000 meq | EXTENDED_RELEASE_TABLET | Freq: Once | ORAL | Status: AC
  Administered 2021-09-09: 40 meq via ORAL
  Filled 2021-09-09: qty 2

## 2021-09-09 MED ORDER — DRONABINOL 2.5 MG PO CAPS: 2.5000 mg | ORAL_CAPSULE | Freq: Two times a day (BID) | ORAL | 1 refills | Status: DC

## 2021-09-09 NOTE — Progress Notes (Signed)
Both of Jaime Bauer"s parents are at the bedside.Jaime Bauer appears very sleepy. Her mother  and father are very encouraging and supportive. Patient did eat avocado toast and some orange juice. She also had some fried  egg. Both parents walked with Jaime Bauer in the hall way, she tolerated well. Discharge instructions were gone over with her mom and one prescription given to have filled. Next medication doses were written on the discharge papers. Information given to patient about anorexia and a list of Mental Health care givers and their phone numbers. Jaime Bauer was discharge via wheelchair to her parents.

## 2021-09-09 NOTE — Progress Notes (Signed)
Progress Note   Subjective  Nasoenteric tube removed last night, somewhat traumatic as the weighted tip had a hard time passing through her nasal canal Very sleepy this morning; got zonisamide around 930 last night; received 1 dose of Marinol 2.5 mg and Compazine IV at the same time around 5:30 PM Ate a small piece of avocado toast for breakfast No vomiting Very sleepy this morning but went away denies pain Mother and father at bedside   Objective  Vital signs in last 24 hours: Temp:  [97.6 F (36.4 C)-99.3 F (37.4 C)] 97.6 F (36.4 C) (02/05 0434) Pulse Rate:  [73-116] 73 (02/05 0434) Resp:  [16-18] 16 (02/05 0434) BP: (103-108)/(56-71) 108/71 (02/05 0434) SpO2:  [99 %-100 %] 99 % (02/05 0434) Last BM Date: 09/08/21 Gen: Asleep, arousable but returns quickly to sleep, NAD HEENT: anicteric  Neuro: nonfocal  Intake/Output from previous day: 02/04 0701 - 02/05 0700 In: 516.1 [P.O.:90; I.V.:426.1] Out: -  Intake/Output this shift: Total I/O In: 110 [P.O.:110] Out: -   Lab Results: Recent Labs    09/07/21 1046 09/08/21 0756  WBC 4.6 9.9  HGB 12.7 11.2*  HCT 38.0 33.4*  PLT 271 265   BMET Recent Labs    09/07/21 1046 09/07/21 1911 09/08/21 0756 09/09/21 0650  NA 137  --  137 137  K 3.8 3.2* 3.5 3.4*  CL 107  --  109 107  CO2 23  --  19* 23  GLUCOSE 92  --  107* 95  BUN 13  --  14 11  CREATININE 0.78  --  0.59 0.64  CALCIUM 9.5  --  8.9 8.9   LFT Recent Labs    09/08/21 0756  PROT 6.4*  ALBUMIN 4.1  AST 12*  ALT 15  ALKPHOS 41  BILITOT 0.6   Studies/Results: DG Abd 1 View  Result Date: 09/07/2021 CLINICAL DATA:  Nasogastric tube placement EXAM: ABDOMEN - 1 VIEW COMPARISON:  None. FINDINGS: Nasogastric tube with the tip projecting over the mid lower abdomen likely within the body of the stomach. No bowel dilatation to suggest obstruction. No evidence of pneumoperitoneum, portal venous gas or pneumatosis. No pathologic calcifications along the  expected course of the ureters. No acute osseous abnormality. IMPRESSION: 1. Nasogastric tube with the tip projecting over the mid lower abdomen likely within the body of the stomach. Electronically Signed   By: Elige Ko M.D.   On: 09/07/2021 18:30   CT CHEST W CONTRAST  Result Date: 09/07/2021 CLINICAL DATA:  Weight loss EXAM: CT CHEST WITH CONTRAST TECHNIQUE: Multidetector CT imaging of the chest was performed during intravenous contrast administration. RADIATION DOSE REDUCTION: This exam was performed according to the departmental dose-optimization program which includes automated exposure control, adjustment of the mA and/or kV according to patient size and/or use of iterative reconstruction technique. CONTRAST:  51mL OMNIPAQUE IOHEXOL 300 MG/ML  SOLN COMPARISON:  None. FINDINGS: Cardiovascular: No significant vascular findings. Normal heart size. No pericardial effusion. Mediastinum/Nodes: No enlarged mediastinal, hilar, or axillary lymph nodes. Thyroid gland, trachea, and esophagus demonstrate no significant findings. Lungs/Pleura: Lungs are clear. No pleural effusion or pneumothorax. Upper Abdomen: No acute abnormality. Musculoskeletal: No chest wall abnormality. No acute or significant osseous findings. IMPRESSION: No acute process or suspicious mass identified in the chest. Electronically Signed   By: Jannifer Hick M.D.   On: 09/07/2021 14:00      Assessment & Recommendations  21 year old with ongoing issues with malnutrition, abdominal bloating/intermittent nausea/alternating diarrhea and constipation,  history of concussion and concern for postconcussion syndrome, anxiety and depression.  Malnutrition/intermittent nausea with abdominal bloating and alternating bowel habits/depression with eating disorder/history of concussion/weight loss -- Nasoenteric tube out; patient simply could not tolerate this for very long; but did tolerate the enteral feeding while it was in place --Very sleepy;  unclear if this is mild CNS depression related to concomitant use of Marinol and zonisamide, zonisamide and prochlorperazine, or a combination of the 3 (most likely) -- Zonisamide has been very helpful with her migraine headaches; though she is also been receiving steroid injections in her head and neck; next was scheduled for Tuesday this week --Nutrition has become the predominant issue over migraines; they will discuss the right combination of medication with her headache specialist. --We have discontinued the prochlorperazine --Hopefully she can tolerate low-dose Marinol and the dose can be titrated to help with nausea but also appetite stimulation; plan is 2.5 mg twice daily but this can be used 3 times daily.  Her mother will be staying with her continuously to monitor her condition.  Obviously if she remains this sleepy I would recommend skipping the Marinol dose. --The plan is for her to establish with a psychologist with nutrition focus at [facility]; they will call for an appointment tomorrow.   -- Her GI symptoms are felt secondary to functional pathology with overlap of anxiety/depression and also related to her concussion history; 3 things occurred around the same time all of which had significant impact on her wellbeing.  Those were 1.  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  COVID-19 and the pandemic restrictions; 3.  Her father was diagnosed with rectal cancer and underwent treatment.  Fortunately he is doing well. --I do think it is reasonable for her to leave the hospital today; they will call for psychology appointment as above; I will work with our office to arrange follow-up with Dr. Meridee Score or one of our APP's soon.        LOS: 1 day   Beverley Fiedler  09/09/2021, 12:01 PM See Loretha Stapler, [facility], to contact our on call provider

## 2021-09-09 NOTE — Discharge Summary (Signed)
Discharge Summary  Jaime Bauer AUQ:[id number] DOB: 2001/04/24  PCP: Christen Butter, NP  Admit date: 09/07/2021 Discharge date: 09/09/2021  Time spent:   Recommendations for Outpatient Follow-up:  F/u with PCP within a week  for hospital discharge follow up, repeat cbc/bmp at follow up F/u with GI F/u with behavioral health  Follow up pending labs at discharge follow up appointment: Unresulted Labs (From admission, onward)     Start     Ordered   09/07/21 2246  Vitamin B1  Once,   R        09/07/21 2245   09/07/21 2246  Vitamin A  Once,   R        09/07/21 2245   09/07/21 2246  Vitamin B6  Once,   R        09/07/21 2245            Discharge Diagnoses:  Active Hospital Problems   Diagnosis Date Noted   Intractable nausea and vomiting 09/07/2021   Anorexia 11/14/2020    Resolved Hospital Problems  No resolved problems to display.    Discharge Condition: stable  Diet recommendation: regular diet   Filed Weights   09/07/21 1008 09/08/21 0500  Weight: 49.1 kg 49.7 kg    History of present illness: ( per admitting provider Dr Ronaldo Miyamoto) Chief Complaint: Poor PO intake     HPI: Jaime Bauer is a 21 y.o. female with medical history significant of migraines, anxiety. Presenting poor PO intake and unintentional weight loss. She reports that she has chronic GI problems. Over the last 3 years, she has lost 30 lbs; with 10 lbs in the last month. Her w/u w/ GI has been unrevealing. She was seen by GI today. They recommended that she go to the ED to have a feeding tube placed and be monitored for refeeding syndrome. She reports that she has nausea and vomiting. She has a global abdominal pain that seems to be worse in the LLQ. She reports a constant back and forth between constipation and diarrhea with her currently being in a cycle of constipation. She has tried zofran and phenergan for nausea. They are ineffective. She has tried remeron for appetite stimulation. It makes  her too drowsy and doesn't seem to help. She denies any other aggravating or alleviating factors.   Hospital Course:  Principal Problem:   Intractable nausea and vomiting Active Problems:   Anorexia   Anorexia- (present on admission) -With unintentional weight loss, intermittent nausea and vomiting, generalized abdominal pain, more on left lower quadrant, with unrevealing GI work-up in the past. -She is started on tube feeds, seen by GI, she tolerated tube feeds at 40 cc/h, but does not desire to continue tube feeds due to tube irritation.  She agreed to start oral nutrition, trial of Marinol, as needed antiemetics Tube feeds discontinued in the evening on  2/ 4.  -she is doing well, start to eat, she desires to go home, she is cleared by GI, she is to f/u with GI   She is also seen by psychiatry who recommended trial of Zyprexa 2.5 mg if Marinol ineffective,  Need to monitor QTc if patient is started on Zyprexa.   Psychiatry recommend continue Prozac 60 mg daily   recommend following up with therapist/counselor who specializes in eating disorders       Underweight Body mass index is 16.31 kg/m.   Discharge Exam: BP 108/71 (BP Location: Left Arm)    Pulse 73  Temp 97.6 F (36.4 C) (Oral)    Resp 16    Ht 5' 8.7" (1.745 m)    Wt 49.7 kg    SpO2 99%    BMI 16.31 kg/m   General: NAD, pleasant Cardiovascular: RRR Respiratory: normal respiratory effort     Discharge Instructions     Diet general   Complete by: As directed    Increase activity slowly   Complete by: As directed       Allergies as of 09/09/2021   No Known Allergies      Medication List     TAKE these medications    dronabinol 2.5 MG capsule Commonly known as: MARINOL Take 1 capsule (2.5 mg total) by mouth 2 (two) times daily before lunch and supper.   FLUoxetine 20 MG capsule Commonly known as: PROZAC Take 60 mg by mouth every morning.   zonisamide 100 MG capsule Commonly known as:  ZONEGRAN Take 150 mg by mouth daily.       No Known Allergies  Follow-up Information     Christen ButterJessup, Joy, NP Follow up.   Specialty: Nurse Practitioner Contact information: [address] [place] [address] [phone]                  The results of significant diagnostics from this hospitalization (including imaging, microbiology, ancillary and laboratory) are listed below for reference.    Significant Diagnostic Studies: DG Abd 1 View  Result Date: 09/07/2021 CLINICAL DATA:  Nasogastric tube placement EXAM: ABDOMEN - 1 VIEW COMPARISON:  None. FINDINGS: Nasogastric tube with the tip projecting over the mid lower abdomen likely within the body of the stomach. No bowel dilatation to suggest obstruction. No evidence of pneumoperitoneum, portal venous gas or pneumatosis. No pathologic calcifications along the expected course of the ureters. No acute osseous abnormality. IMPRESSION: 1. Nasogastric tube with the tip projecting over the mid lower abdomen likely within the body of the stomach. Electronically Signed   By: Elige KoHetal  Patel M.D.   On: 09/07/2021 18:30   CT CHEST W CONTRAST  Result Date: 09/07/2021 CLINICAL DATA:  Weight loss EXAM: CT CHEST WITH CONTRAST TECHNIQUE: Multidetector CT imaging of the chest was performed during intravenous contrast administration. RADIATION DOSE REDUCTION: This exam was performed according to the departmental dose-optimization program which includes automated exposure control, adjustment of the mA and/or kV according to patient size and/or use of iterative reconstruction technique. CONTRAST:  75mL OMNIPAQUE IOHEXOL 300 MG/ML  SOLN COMPARISON:  None. FINDINGS: Cardiovascular: No significant vascular findings. Normal heart size. No pericardial effusion. Mediastinum/Nodes: No enlarged mediastinal, hilar, or axillary lymph nodes. Thyroid gland, trachea, and esophagus demonstrate no significant findings. Lungs/Pleura: Lungs are clear. No  pleural effusion or pneumothorax. Upper Abdomen: No acute abnormality. Musculoskeletal: No chest wall abnormality. No acute or significant osseous findings. IMPRESSION: No acute process or suspicious mass identified in the chest. Electronically Signed   By: Jannifer Hickelaney  Williams M.D.   On: 09/07/2021 14:00   NM Gastric Emptying  Result Date: 09/03/2021 CLINICAL DATA:  Early satiety EXAM: NUCLEAR MEDICINE GASTRIC EMPTYING SCAN TECHNIQUE: After oral ingestion of radiolabeled meal, sequential abdominal images were obtained for 120 minutes. Residual percentage of activity remaining within the stomach was calculated at 60 and 120 minutes. RADIOPHARMACEUTICALS:  Two mCi [id number] sulfur colloid in standardized meal COMPARISON:  None. FINDINGS: Expected location of the stomach in the left upper quadrant. Ingested meal empties the stomach gradually over the course of the study with 21%  retention at 60 min and 7% retention at 120 min (normal retention less than 30% at a 120 min). IMPRESSION: Normal gastric emptying study. Electronically Signed   By: Guadlupe Spanish M.D.   On: 09/03/2021 14:04    Microbiology: Recent Results (from the past 240 hour(s))  Resp Panel by RT-PCR (Flu A&B, Covid) Nasopharyngeal Swab     Status: None   Collection Time: 09/07/21 11:34 AM   Specimen: Nasopharyngeal Swab; Nasopharyngeal(NP) swabs in vial transport medium  Result Value Ref Range Status   SARS Coronavirus 2 by RT PCR NEGATIVE NEGATIVE Final    Comment: (NOTE) SARS-CoV-2 target nucleic acids are NOT DETECTED.  The SARS-CoV-2 RNA is generally detectable in upper respiratory specimens during the acute phase of infection. The lowest concentration of SARS-CoV-2 viral copies this assay can detect is 138 copies/mL. A negative result does not preclude SARS-Cov-2 infection and should not be used as the sole basis for treatment or other patient management decisions. A negative result may occur with  improper specimen  collection/handling, submission of specimen other than nasopharyngeal swab, presence of viral mutation(s) within the areas targeted by this assay, and inadequate number of viral copies(<138 copies/mL). A negative result must be combined with clinical observations, patient history, and epidemiological information. The expected result is Negative.  Fact Sheet for Patients:  BloggerCourse.com  Fact Sheet for Healthcare Providers:  SeriousBroker.it  This test is no t yet approved or cleared by the [place] FDA and  has been authorized for detection and/or diagnosis of SARS-CoV-2 by FDA under an Emergency Use Authorization (EUA). This EUA will remain  in effect (meaning this test can be used) for the duration of the COVID-19 declaration under Section 564(b)(1) of the Act, 21 U.S.C.section 360bbb-3(b)(1), unless the authorization is terminated  or revoked sooner.       Influenza A by PCR NEGATIVE NEGATIVE Final   Influenza B by PCR NEGATIVE NEGATIVE Final    Comment: (NOTE) The Xpert Xpress SARS-CoV-2/FLU/RSV plus assay is intended as an aid in the diagnosis of influenza from Nasopharyngeal swab specimens and should not be used as a sole basis for treatment. Nasal washings and aspirates are unacceptable for Xpert Xpress SARS-CoV-2/FLU/RSV testing.  Fact Sheet for Patients: BloggerCourse.com  Fact Sheet for Healthcare Providers: SeriousBroker.it  This test is not yet approved or cleared by the [place] FDA and has been authorized for detection and/or diagnosis of SARS-CoV-2 by FDA under an Emergency Use Authorization (EUA). This EUA will remain in effect (meaning this test can be used) for the duration of the COVID-19 declaration under Section 564(b)(1) of the Act, 21 U.S.C. section 360bbb-3(b)(1), unless the authorization is terminated or revoked.  Performed at [facility], [address], [place], [place] [address]      Labs: Basic Metabolic Panel: Recent Labs  Lab 09/07/21 1046 09/07/21 1911 09/08/21 0756 09/08/21 1645 09/09/21 0650  NA 137  --  137  --  137  K 3.8 3.2* 3.5  --  3.4*  CL 107  --  109  --  107  CO2 23  --  19*  --  23  GLUCOSE 92  --  107*  --  95  BUN 13  --  14  --  11  CREATININE 0.78  --  0.59  --  0.64  CALCIUM 9.5  --  8.9  --  8.9  MG 2.1 2.0 2.1 2.0 1.9  PHOS 3.6 2.1* 4.6 3.5 3.8   Liver  Function Tests: Recent Labs  Lab 09/07/21 1046 09/08/21 0756  AST 13* 12*  ALT 14 15  ALKPHOS 44 41  BILITOT 0.8 0.6  PROT 7.0 6.4*  ALBUMIN 4.4 4.1   Recent Labs  Lab 09/07/21 1046  LIPASE 30   No results for input(s): AMMONIA in the last 168 hours. CBC: Recent Labs  Lab 09/07/21 1046 09/08/21 0756  WBC 4.6 9.9  NEUTROABS 2.8  --   HGB 12.7 11.2*  HCT 38.0 33.4*  MCV 89.8 90.5  PLT 271 265   Cardiac Enzymes: No results for input(s): CKTOTAL, CKMB, CKMBINDEX, TROPONINI in the last 168 hours. BNP: BNP (last 3 results) No results for input(s): BNP in the last 8760 hours.  ProBNP (last 3 results) No results for input(s): PROBNP in the last 8760 hours.  CBG: Recent Labs  Lab 09/08/21 1155 09/08/21 1700 09/08/21 2200 09/09/21 0435 09/09/21 0727  GLUCAP 98 108* 152* 103* 81    FURTHER DISCHARGE INSTRUCTIONS:   Get Medicines reviewed and adjusted: Please take all your medications with you for your next visit with your Primary MD   Laboratory/radiological data: Please request your Primary MD to go over all hospital tests and procedure/radiological results at the follow up, please ask your Primary MD to get all Hospital records sent to his/her office.   In some cases, they will be blood work, cultures and biopsy results pending at the time of your discharge. Please request that your primary care M.D. goes through all the records of your hospital data and follows up on these  results.   Also Note the following: If you experience worsening of your admission symptoms, develop shortness of breath, life threatening emergency, suicidal or homicidal thoughts you must seek medical attention immediately by calling 911 or calling your MD immediately  if symptoms less severe.   You must read complete instructions/literature along with all the possible adverse reactions/side effects for all the Medicines you take and that have been prescribed to you. Take any new Medicines after you have completely understood and accpet all the possible adverse reactions/side effects.    Do not drive when taking Pain medications or sleeping medications (Benzodaizepines)   Do not take more than prescribed Pain, Sleep and Anxiety Medications. It is not advisable to combine anxiety,sleep and pain medications without talking with your primary care practitioner   Special Instructions: If you have smoked or chewed Tobacco  in the last 2 yrs please stop smoking, stop any regular Alcohol  and or any Recreational drug use.   Wear Seat belts while driving.   Please note: You were cared for by a hospitalist during your hospital stay. Once you are discharged, your primary care physician will handle any further medical issues. Please note that NO REFILLS for any discharge medications will be authorized once you are discharged, as it is imperative that you return to your primary care physician (or establish a relationship with a primary care physician if you do not have one) for your post hospital discharge needs so that they can reassess your need for medications and monitor your lab values.     Signed:  Albertine Grates MD, PhD, FACP  [facility] 09/09/2021, 11:23 AM

## 2021-09-10 ENCOUNTER — Telehealth: Payer: Self-pay

## 2021-09-10 LAB — VITAMIN B6: Vitamin B6: 8.9 ug/L (ref 3.4–65.2)

## 2021-09-10 NOTE — Telephone Encounter (Signed)
-----   Message from Beverley Fiedler, MD sent at 09/09/2021 12:10 PM EST ----- Jaime Bauer patient Leaving the hospital. Needs office followup with 10 days if possible (GM or APP). Thanks [employer]

## 2021-09-10 NOTE — Telephone Encounter (Signed)
Pt has been scheduled for a hospital follow up visit with Carmel Sacramento, PA on 09/20/21 at 1:30 pm. Pt is aware.

## 2021-09-11 ENCOUNTER — Other Ambulatory Visit: Payer: Self-pay

## 2021-09-11 ENCOUNTER — Ambulatory Visit (INDEPENDENT_AMBULATORY_CARE_PROVIDER_SITE_OTHER): Payer: No Typology Code available for payment source | Admitting: Licensed Clinical Social Worker

## 2021-09-11 DIAGNOSIS — F4321 Adjustment disorder with depressed mood: Secondary | ICD-10-CM | POA: Diagnosis not present

## 2021-09-11 LAB — VITAMIN B1: Vitamin B1 (Thiamine): 97.5 nmol/L (ref 66.5–200.0)

## 2021-09-11 NOTE — BH Specialist Note (Signed)
Integrated Behavioral Health Follow Up In-Person Visit  MRN: [id number] Name: Jaime Bauer  Number of [place] Clinician visits: 2/6 Session Start time: 2:30P  Session End time: 3:03P Total time:  33  minutes  Types of Service: Individual psychotherapy  Interpretor:No. Interpretor Name and Language: none  Subjective: Jaime Bauer is a 21 y.o. female accompanied by  self Patient was referred by her nutritionist for stomach concerns and anxiety. Patient reports the following symptoms/concerns: anxiety and depressive symptoms  Duration of problem: years; Severity of problem: severe  Objective: Mood: Anxious and Affect: Appropriate Risk of harm to self or others: No plan to harm self or others  Life Context: Family and Social: lives alone but mom is visiting her from [place].  School/Work: [name] at [employer] part time at [employer]: likes doing homework and likes going to school.  Life Changes: multiple concussions from playing soccer in college.   Patient and/or Family's Strengths/Protective Factors: Social and Emotional competence, Concrete supports in place (healthy food, safe environments, etc.), and Physical Health (exercise, healthy diet, medication compliance, etc.)  Goals Addressed: Patient will:   Increase knowledge and/or ability of: healthy habits and self-management skills for food tolerance  Demonstrate ability to: Increase healthy adjustment to current life circumstances and Increase adequate support systems for patient/family  Progress towards Goals: Achieved  Interventions: Interventions utilized:  Supportive Counseling and Supportive Reflection Standardized Assessments completed: Not Needed  Patient and/or Family Response:Pt shared traumatic experiences advising that she was seen last week and she lost 2 pounds in 1 week. She reports being admitted to the hospital where there were tubes stuck up her nose to help with  nutrition. Pt reported this was a way to help her receive nutrition but the experience was very traumatic for her. Pt reports vomiting a lot afterwards. Pt reports being in the hospital for 2 days.   Pt reports taking a new medication that's supposed to increase her appetite and decrease nausea. She reports still feeling nauseous. Pt. Reports she's eating a bit more and she's not sure if it's because the traumatic experience scared her so much and she doesn't want to go through that again or if it's the medicine making her eat. Pt reports her mother came form [place] to assist her and to help her get back on track. Mother is making small meals and assisting pt with snacks throughout the day.  Pt reports she does have a therapy appointment with [facility] who specializes in eating disorders on 09/20/21. She reports therapist will assist her in retraining her brain to connect wit food again. Pt reports there is a disconnect with her and food which came from recent concussions from sports. Pt. Shares excitement in starting therapy.     Patient Centered Plan: Patient is on the following Treatment Plan(s): Anxiety and depression  Assessment: Patient currently experiencing anxiety, depression and feeling nauseous due to stomach concerns and not being able to eat food. .   Patient may benefit from continuing to see her nutritionist, medication management and outpatient therapy.  Plan: Follow up with behavioral health clinician on : Will schedule follow up if needed.  Behavioral recommendations: Recommended for pt to follow through and continue services with [facility]. Recommended that pt create a nightly routine to incorporate food, bath, shower, meditation app and sleep.  Referral(s): [facility] (In Clinic) "From scale of 1-10, how likely are you to follow plan?": Pt agreed to this plan.  [place] [name], LCSWA

## 2021-09-14 LAB — VITAMIN A: Vitamin A (Retinoic Acid): 24.3 ug/dL (ref 18.9–57.3)

## 2021-09-17 ENCOUNTER — Ambulatory Visit (INDEPENDENT_AMBULATORY_CARE_PROVIDER_SITE_OTHER): Payer: Self-pay | Admitting: Medical-Surgical

## 2021-09-17 DIAGNOSIS — Z91199 Patient's noncompliance with other medical treatment and regimen due to unspecified reason: Secondary | ICD-10-CM

## 2021-09-17 NOTE — Progress Notes (Signed)
   Complete physical exam  Patient: Jaime Bauer   DOB: 05/25/2000   21 y.o. Female  MRN: [id number]  Subjective:    No chief complaint on file.   Jaime Bauer is a 21 y.o. female who presents today for a complete physical exam. She reports consuming a {diet types:17450} diet. {types:19826} She generally feels {DESC; WELL/FAIRLY WELL/POORLY:18703}. She reports sleeping {DESC; WELL/FAIRLY WELL/POORLY:18703}. She {does/does not:200015} have additional problems to discuss today.    Most recent fall risk assessment:    01/30/2022   10:42 AM  Fall Risk   Falls in the past year? 0  Number falls in past yr: 0  Injury with Fall? 0  Risk for fall due to : No Fall Risks  Follow up Falls evaluation completed     Most recent depression screenings:    01/30/2022   10:42 AM 12/21/2020   10:46 AM  PHQ 2/9 Scores  PHQ - 2 Score 0 0  PHQ- 9 Score 5     {VISON DENTAL STD PSA (Optional):27386}  {History (Optional):23778}  Patient Care Team: Teagen Mcleary, NP as PCP - General (Nurse Practitioner)   Outpatient Medications Prior to Visit  Medication Sig   fluticasone (FLONASE) 50 MCG/ACT nasal spray Place 2 sprays into both nostrils in the morning and at bedtime. After 7 days, reduce to once daily.   norgestimate-ethinyl estradiol (SPRINTEC 28) 0.25-35 MG-MCG tablet Take 1 tablet by mouth daily.   Nystatin POWD Apply liberally to affected area 2 times per day   spironolactone (ALDACTONE) 100 MG tablet Take 1 tablet (100 mg total) by mouth daily.   No facility-administered medications prior to visit.    ROS        Objective:     There were no vitals taken for this visit. {Vitals History (Optional):23777}  Physical Exam   No results found for any visits on 03/07/22. {Show previous labs (optional):23779}    Assessment & Plan:    Routine Health Maintenance and Physical Exam  Immunization History  Administered Date(s) Administered   DTaP 08/08/1999, 10/04/1999,  12/13/1999, 08/28/2000, 03/13/2004   Hepatitis A 01/08/2008, 01/13/2009   Hepatitis B 05/26/1999, 07/03/1999, 12/13/1999   HiB (PRP-OMP) 08/08/1999, 10/04/1999, 12/13/1999, 08/28/2000   IPV 08/08/1999, 10/04/1999, 06/02/2000, 03/13/2004   Influenza,inj,Quad PF,6+ Mos 04/15/2014   Influenza-Unspecified 07/15/2012   MMR 06/02/2001, 03/13/2004   Meningococcal Polysaccharide 01/13/2012   Pneumococcal Conjugate-13 08/28/2000   Pneumococcal-Unspecified 12/13/1999, 02/26/2000   Tdap 01/13/2012   Varicella 06/02/2000, 01/08/2008    Health Maintenance  Topic Date Due   HIV Screening  Never done   Hepatitis C Screening  Never done   INFLUENZA VACCINE  03/05/2022   PAP-Cervical Cytology Screening  03/07/2022 (Originally 05/24/2020)   PAP SMEAR-Modifier  03/07/2022 (Originally 05/24/2020)   TETANUS/TDAP  03/07/2022 (Originally 01/12/2022)   HPV VACCINES  Discontinued   COVID-19 Vaccine  Discontinued    Discussed health benefits of physical activity, and encouraged her to engage in regular exercise appropriate for her age and condition.  Problem List Items Addressed This Visit   None Visit Diagnoses     Annual physical exam    -  Primary   Cervical cancer screening       Need for Tdap vaccination          No follow-ups on file.     Aireona Torelli, NP   

## 2021-09-18 ENCOUNTER — Ambulatory Visit: Payer: No Typology Code available for payment source | Admitting: Psychiatry

## 2021-09-20 ENCOUNTER — Encounter: Payer: Self-pay | Admitting: Physician Assistant

## 2021-09-20 ENCOUNTER — Ambulatory Visit (INDEPENDENT_AMBULATORY_CARE_PROVIDER_SITE_OTHER): Payer: No Typology Code available for payment source | Admitting: Physician Assistant

## 2021-09-20 VITALS — BP 98/70 | HR 76 | Ht 69.0 in | Wt 114.0 lb

## 2021-09-20 DIAGNOSIS — R634 Abnormal weight loss: Secondary | ICD-10-CM

## 2021-09-20 DIAGNOSIS — E43 Unspecified severe protein-calorie malnutrition: Secondary | ICD-10-CM | POA: Diagnosis not present

## 2021-09-20 MED ORDER — DRONABINOL 2.5 MG PO CAPS: 2.5000 mg | ORAL_CAPSULE | Freq: Three times a day (TID) | ORAL | 1 refills | Status: DC

## 2021-09-20 NOTE — Progress Notes (Signed)
Attending Physician's Attestation   I have reviewed the chart.   I agree with the Advanced Practitioner's note, impression, and recommendations with any updates as below. Thank you for seeing her in follow-up.  Agree with increasing Marinol for now.   Justice Britain, MD [facility] # [phone]

## 2021-09-20 NOTE — Patient Instructions (Signed)
We have sent the following medications to your pharmacy for you to pick up at your convenience: Increase Marinol 2.5 mg to three times daily.   If you are age 22 or older, your body mass index should be between 23-30. Your Body mass index is 16.83 kg/m. If this is out of the aforementioned range listed, please consider follow up with your Primary Care Provider.  If you are age 63 or younger, your body mass index should be between 19-25. Your Body mass index is 16.83 kg/m. If this is out of the aformentioned range listed, please consider follow up with your Primary Care Provider.   ________________________________________________________  The  GI providers would like to encourage you to use [facility] to communicate with providers for non-urgent requests or questions.  Due to long hold times on the telephone, sending your provider a message by [facility] may be a faster and more efficient way to get a response.  Please allow 48 business hours for a response.  Please remember that this is for non-urgent requests.  _______________________________________________________

## 2021-09-20 NOTE — Progress Notes (Signed)
Chief Complaint: Follow-up malnourishment and weight loss  HPI:    Jaime Bauer is a 21 year old female, known well to Dr. Meridee Score, with a past medical history as listed below, who presents to clinic today for follow-up after being seen in the hospital for her malnourishment and weight loss.    09/07/2021-09/09/2021 patient admitted to the hospital for NG tube placement due to malnourishment and weight loss.  Please see detailed consult note and Dr. Elesa Hacker last office visit note 06/15/2021 for further details.  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  She was started on Marinol 2.5 mg twice daily for nausea and to help with appetite.  [facility].    Today, patient presents to clinic accompanied by her mother who has now moved up here to be with her and monitor things for a while.  Per her mother patient is gaining some weight (though she has not been letting her daughter see as she was told not to).  Mother is making her breakfast of oatmeal and giving her snacks to eat throughout the day and also making her dinner.  Overall she has had an increase in intake.  Patient tells me she continues with some nausea and is not overly tired from the Marinol which she is taking twice a day at this point.  Does tell me she goes 2-3 or sometimes more days in between a bowel movement which are still small and "I do not feel like I empty".      Patient is [facility] with [facility] in [place] and has her first appointment this afternoon.    Denies fever, chills, further weight loss or change in abdominal pain/symptoms.  Past Medical History:  Diagnosis Date   Anxiety    Chronic headaches    Concussion    w/o LOC   Depression    History of multiple concussions    from soccer   Syncope     Past Surgical History:   Procedure Laterality Date   COLONOSCOPY  01/09/2021   NO PAST SURGERIES     UPPER GASTROINTESTINAL ENDOSCOPY  01/09/2021   2020- [facility] keys    Current Outpatient Medications  Medication Sig Dispense Refill   FLUoxetine (PROZAC) 20 MG capsule Take 60 mg by mouth every morning.     zonisamide (ZONEGRAN) 100 MG capsule Take 150 mg by mouth daily.     dronabinol (MARINOL) 2.5 MG capsule Take 1 capsule (2.5 mg total) by mouth 3 (three) times daily. 90 capsule 1   No current facility-administered medications for this visit.    Allergies as of 09/20/2021   (No Known Allergies)    Family History  Problem Relation Age of Onset   Skin cancer Mother    Colon polyps Mother    Colon cancer Father 40   Colon polyps Father    Rectal cancer Father    Stroke Maternal Grandfather    Hypertension Paternal Uncle    Esophageal cancer Neg Hx    Inflammatory bowel disease Neg Hx    Liver disease Neg Hx    Pancreatic cancer Neg Hx    Stomach cancer Neg Hx     Social History   Socioeconomic History   Marital status: Single    Spouse name: Not on file   Number of children: 0   Years of  education: 14   Highest education level: Not on file  Occupational History   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  Tobacco Use   Smoking status: Never   Smokeless tobacco: Never  Vaping Use   Vaping Use: Never used  Substance and Sexual Activity   Alcohol use: Never   Drug use: Never   Sexual activity: Yes    Birth control/protection: None  Other Topics Concern   Not on file  Social History Narrative   Right handed   Drinks caffeine   Two story home   Social Determinants of Health   Financial Resource Strain: Not on file  Food Insecurity: Not on file  Transportation Needs: Not on file  Physical Activity: Not on file  Stress: Not on file  Social Connections: Not on file  Intimate Partner Violence: Not on file    Review of Systems:    Constitutional: No weight loss Cardiovascular: No chest  pain Respiratory: No SOB  Gastrointestinal: See HPI and otherwise negative Psychiatric: +depression/ anorexia   Physical Exam:  Vital signs: BP 98/70    Pulse 76    Ht 5\' 9"  (1.753 m)    Wt 114 lb (51.7 kg)    SpO2 99%    BMI 16.83 kg/m   Constitutional:   Pleasant thin appearing Caucasian female appears to be in NAD, Well developed, Well nourished, alert and cooperative Respiratory: Respirations even and unlabored. Lungs clear to auscultation bilaterally.   No wheezes, crackles, or rhonchi.  Cardiovascular: Normal S1, S2. No MRG. Regular rate and rhythm. No peripheral edema, cyanosis or pallor.  Gastrointestinal:  Soft, nondistended, nontender. No rebound or guarding. Normal bowel sounds. No appreciable masses or hepatomegaly. Rectal:  Not performed.  Psychiatric: Oriented to person, place and time. Demonstrates good judgement and reason without abnormal affect or behaviors.  RELEVANT LABS AND IMAGING: CBC    Component Value Date/Time   WBC 9.9 09/08/2021 0756   RBC 3.69 (L) 09/08/2021 0756   HGB 11.2 (L) 09/08/2021 0756   HCT 33.4 (L) 09/08/2021 0756   PLT 265 09/08/2021 0756   MCV 90.5 09/08/2021 0756   MCH 30.4 09/08/2021 0756   MCHC 33.5 09/08/2021 0756   RDW 12.5 09/08/2021 0756   LYMPHSABS 1.4 09/07/2021 1046   MONOABS 0.3 09/07/2021 1046   EOSABS 0.0 09/07/2021 1046   BASOSABS 0.0 09/07/2021 1046    CMP     Component Value Date/Time   NA 137 09/09/2021 0650   K 3.4 (L) 09/09/2021 0650   CL 107 09/09/2021 0650   CO2 23 09/09/2021 0650   GLUCOSE 95 09/09/2021 0650   BUN 11 09/09/2021 0650   CREATININE 0.64 09/09/2021 0650   CREATININE 0.80 08/27/2021 1629   CALCIUM 8.9 09/09/2021 0650   PROT 6.4 (L) 09/08/2021 0756   ALBUMIN 4.1 09/08/2021 0756   AST 12 (L) 09/08/2021 0756   ALT 15 09/08/2021 0756   ALKPHOS 41 09/08/2021 0756   BILITOT 0.6 09/08/2021 0756   GFRNONAA >60 09/09/2021 0650    Assessment: 1.  Malnourishment and weight loss: Patient has had  extensive testing and diagnosis is now of functional disorder +/- anorexia, patient's weight has been increasing recently with her mother living with her and helping her eat meals as well as with the Marinol 2.5 mg twice daily, does continue with some nausea and constipation symptoms 2.  Constipation: Does not sound like the patient is overly bothered by this, but if it becomes more of a problem would recommend MiraLAX once daily,  definitely related to above  Plan: 1.  Increased Marinol to 2.5 mg 3 times daily.  #90 with 3 refills.  [facility] the [facility] to [facility] for the patient but also gave her a prescription in case she cannot find it there. 2.  Encouraged the patient to contact us and let us know how this is going over the next couple of weeks.  If she is increasingly drowsy or continues with nausea then this could be increased further or stopped. 3.  Patient will follow with Dr. Meridee Score in the next 1 to 2 months.  Hyacinth Meeker, PA-C [facility] 09/20/2021, 1:59 PM  Cc: Christen Butter, NP

## 2021-09-21 NOTE — Telephone Encounter (Signed)
Patients mother called requesting to speak with a nurse regarding hospital stay.

## 2021-09-21 NOTE — Telephone Encounter (Signed)
Disregard Victorino Dike I sent to you in error.

## 2021-09-21 NOTE — Telephone Encounter (Signed)
Left message on machine to call back  

## 2021-09-21 NOTE — Telephone Encounter (Signed)
The pt saw Victorino Dike yesterday will send to her nurse

## 2021-09-25 NOTE — Telephone Encounter (Signed)
Lm on vm for patient to return call 

## 2021-09-25 NOTE — Telephone Encounter (Signed)
Pt returned call. Her mother is present for the conversation. Pt's mother stated that the patient's hospital stay was denied by their insurance. I told her that the hospital billing and our office billing are separate. She states that she just wanted to know if there was a way that we could appeal it. I provided her with the billing phone number and asked that she call them to see if there was anything that they can do. I gave her our office fax number as well. I told her to have them fax any forms that need to be completed by [place]. Pt's mother verbalized understanding and had no concerns at the end of the call.

## 2021-10-03 ENCOUNTER — Telehealth: Payer: Self-pay | Admitting: Physician Assistant

## 2021-10-03 MED ORDER — DRONABINOL 2.5 MG PO CAPS: 2.5000 mg | ORAL_CAPSULE | Freq: Three times a day (TID) | ORAL | 1 refills | Status: DC

## 2021-10-03 NOTE — Telephone Encounter (Signed)
Patient's mother called (patient is in class) regarding Marinol prescription.  When she came in to see Victorino Dike, the dosage was increased to three times a day and, when they went back to the pharmacy to get it refilled (she only has 3 pills left), they told her it was too soon to get it filled and that our office would have to call the pharmacy with a new prescription reflecting the updated dosage.  She is having to get the medicine filled at the [facility] on [address] in [place].  Their phone number is [phone].  Please call mom if there are any issues with calling this in for patient.  Thank you. ?

## 2021-10-03 NOTE — Telephone Encounter (Signed)
Script sent to new pharmacy

## 2021-10-19 ENCOUNTER — Other Ambulatory Visit (INDEPENDENT_AMBULATORY_CARE_PROVIDER_SITE_OTHER): Payer: No Typology Code available for payment source

## 2021-10-19 ENCOUNTER — Encounter: Payer: Self-pay | Admitting: Gastroenterology

## 2021-10-19 ENCOUNTER — Ambulatory Visit (INDEPENDENT_AMBULATORY_CARE_PROVIDER_SITE_OTHER): Payer: No Typology Code available for payment source | Admitting: Gastroenterology

## 2021-10-19 VITALS — BP 96/62 | HR 70 | Ht 69.0 in | Wt 115.6 lb

## 2021-10-19 DIAGNOSIS — R1084 Generalized abdominal pain: Secondary | ICD-10-CM | POA: Diagnosis not present

## 2021-10-19 DIAGNOSIS — R110 Nausea: Secondary | ICD-10-CM

## 2021-10-19 DIAGNOSIS — R194 Change in bowel habit: Secondary | ICD-10-CM

## 2021-10-19 DIAGNOSIS — R634 Abnormal weight loss: Secondary | ICD-10-CM

## 2021-10-19 DIAGNOSIS — R140 Abdominal distension (gaseous): Secondary | ICD-10-CM

## 2021-10-19 DIAGNOSIS — K30 Functional dyspepsia: Secondary | ICD-10-CM

## 2021-10-19 LAB — CBC
HCT: 37.3 % (ref 36.0–46.0)
Hemoglobin: 12.6 g/dL (ref 12.0–15.0)
MCHC: 33.9 g/dL (ref 30.0–36.0)
MCV: 89.3 fl (ref 78.0–100.0)
Platelets: 314 10*3/uL (ref 150.0–400.0)
RBC: 4.18 Mil/uL (ref 3.87–5.11)
RDW: 13 % (ref 11.5–15.5)
WBC: 5.7 10*3/uL (ref 4.0–10.5)

## 2021-10-19 LAB — PROTIME-INR
INR: 1.2 ratio — ABNORMAL HIGH (ref 0.8–1.0)
Prothrombin Time: 12.7 s (ref 9.6–13.1)

## 2021-10-19 LAB — PHOSPHORUS: Phosphorus: 4 mg/dL (ref 2.3–4.6)

## 2021-10-19 LAB — COMPREHENSIVE METABOLIC PANEL
ALT: 14 U/L (ref 0–35)
AST: 12 U/L (ref 0–37)
Albumin: 4.8 g/dL (ref 3.5–5.2)
Alkaline Phosphatase: 51 U/L (ref 39–117)
BUN: 16 mg/dL (ref 6–23)
CO2: 27 mEq/L (ref 19–32)
Calcium: 9.7 mg/dL (ref 8.4–10.5)
Chloride: 106 mEq/L (ref 96–112)
Creatinine, Ser: 0.83 mg/dL (ref 0.40–1.20)
GFR: 100.74 mL/min (ref >60.00)
Glucose, Bld: 76 mg/dL (ref 70–99)
Potassium: 3.9 mEq/L (ref 3.5–5.1)
Sodium: 140 mEq/L (ref 135–145)
Total Bilirubin: 0.5 mg/dL (ref 0.2–1.2)
Total Protein: 7.2 g/dL (ref 6.0–8.3)

## 2021-10-19 LAB — SEDIMENTATION RATE: Sed Rate: 4 mm/hr (ref 0–20)

## 2021-10-19 LAB — C-REACTIVE PROTEIN: CRP: <1 mg/dL (ref 0.5–20.0)

## 2021-10-19 LAB — MAGNESIUM: Magnesium: 2.1 mg/dL (ref 1.5–2.5)

## 2021-10-19 LAB — LIPASE: Lipase: 21 U/L (ref 11.0–59.0)

## 2021-10-19 NOTE — Patient Instructions (Addendum)
Xifaxan 550mg  - Take 1 tablet by mouth twice daily with meals  for 14 days.  ? ?For the next week continue current dose, then increase Marinol to 5mg  - twice daily.   ? ?Your provider has requested that you go to the basement level for lab work before leaving today. Press "B" on the elevator. The lab is located at the first door on the left as you exit the elevator. ? ?Follow up on :  ?11/27/21 at 8:30am ? ?If you are age 22 or older, your body mass index should be between 23-30. Your Body mass index is 17.07 kg/m?76 If this is out of the aforementioned range listed, please consider follow up with your Primary Care Provider. ? ?If you are age 72 or younger, your body mass index should be between 19-25. Your Body mass index is 17.07 kg/m?77 If this is out of the aformentioned range listed, please consider follow up with your Primary Care Provider.  ? ?________________________________________________________ ? ?The [place] [facility] providers would like to encourage you to use [facility] to communicate with providers for non-urgent requests or questions.  Due to long hold times on the telephone, sending your provider a message by [facility] may be a faster and more efficient way to get a response.  Please allow 48 business hours for a response.  Please remember that this is for non-urgent requests.  ?_______________________________________________________ ? ?Thank you for choosing me and [facility]. ? ?Dr. [facility] ?        ?

## 2021-10-19 NOTE — Progress Notes (Signed)
? ?GASTROENTEROLOGY OUTPATIENT CLINIC VISIT  ? ?Primary Care Provider ?Samuel Bouche, NP ?[address] ?[place] [address] ?[phone] ? ?Patient Profile: ?Keeona Liebelt is a 21 y.o. 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  The patient presents to the [facility] for an evaluation and management of problem(s) noted below: ? ?Problem List ?1. Unintentional weight loss   ?2. Generalized abdominal pain   ?3. Bloating   ?4. Change in bowel habits   ?5. Upset stomach   ?6. Nausea without vomiting   ? ? ?History of Present Illness ?Please see prior notes for full details of HPI. ? ?Interval History ?The patient returns for follow-up and she is accompanied by her mother.  The patient was last evaluated by PA Lemmon and had weight gain since her discharge from the hospital.  She has gained weight once again.  Patient has [facility] working with [facility].  The patient when her mother is monitoring her and ensuring that food is available, is able to eat.  The increased Marinol has helped somewhat though initially it caused some issues with being more sleepy but she feels that is improving.  She has taken it 3 times daily.  She continues to have altered bowel habits of diarrhea and constipation.  There has been no blood in her stools.  Nausea continues to be an issue at times but she is not vomiting as frequently.  She continues to do well in school. ? ?GI Review of Systems ?Positive as above including bloating ?Negative for dysphagia, odynophagia  ? ?Review of Systems ?General: Denies fevers/chills/unintentional weight loss ?Cardiovascular: Denies chest pain ?Pulmonary: Denies shortness of breath ?Gastroenterological: See HPI ?Genitourinary: Denies darkened urine ?Hematological: Denies  easy bruising/bleeding ?Dermatological: Denies jaundice ?Psychological: Mood is stable but hopeful to get better ? ? ?Medications ?Current Outpatient Medications  ?Medication Sig Dispense Refill  ? dronabinol (MARINOL) 2.5 MG capsule Take 1 capsule (2.5 mg total) by mouth 3 (three) times daily. (Patient taking differently: Take 2.5 mg by mouth 2 (two) times daily before lunch and supper. Take 5mg  by mouth twice daily) 90 capsule 1  ? FLUoxetine (PROZAC) 20 MG capsule Take 40 mg by mouth every morning.    ? levonorgestrel (KYLEENA) 19.5 MG IUD 1 each by Intrauterine route once.    ? rifaximin (XIFAXAN) 550 MG TABS tablet Take 550 mg by mouth 2 (two) times daily. For 14 days    ? zonisamide (ZONEGRAN) 100 MG capsule Take 150 mg by mouth daily.    ? ?No current facility-administered medications for this visit.  ? ? ?Allergies ?No Known Allergies ? ?Histories ?Past Medical History:  ?Diagnosis Date  ? Anxiety   ? Chronic headaches   ? Concussion   ? w/o LOC  ? Depression   ? History of multiple concussions   ? from soccer  ? Syncope   ? ?Past Surgical History:  ?Procedure Laterality Date  ? COLONOSCOPY  01/09/2021  ? NO PAST SURGERIES    ? UPPER GASTROINTESTINAL ENDOSCOPY  01/09/2021  ? 2020- [facility]  ? ?Social History  ? ?Socioeconomic History  ? Marital status: Single  ?  Spouse name: Not on file  ? Number of children: 0  ? Years of education: 30  ? Highest education level: Not on file  ?Occupational History  ? 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  Tobacco Use  ? Smoking status: Never  ? Smokeless tobacco: Never  ?Vaping Use  ? Vaping Use: Never used  ?Substance and Sexual Activity  ? Alcohol use: Never  ? Drug use: Never  ? Sexual activity: Yes  ?  Birth control/protection: None  ?Other Topics Concern  ? Not on file  ?Social History Narrative  ? Right handed  ? Drinks caffeine  ? Two story home  ? ?Social Determinants of Health  ? ?Financial Resource Strain: Not on file  ?Food Insecurity: Not on file  ?Transportation Needs:  Not on file  ?Physical Activity: Not on file  ?Stress: Not on file  ?Social Connections: Not on file  ?Intimate Partner Violence: Not on file  ? ?Family History  ?Problem Relation Age of Onset  ? Skin cancer Mother   ? Colon polyps Mother   ? Colon cancer Father 78  ? Colon polyps Father   ? Rectal cancer Father   ? Stroke Maternal Grandfather   ? Hypertension Paternal Uncle   ? Esophageal cancer Neg Hx   ? Inflammatory bowel disease Neg Hx   ? Liver disease Neg Hx   ? Pancreatic cancer Neg Hx   ? Stomach cancer Neg Hx   ? ?I have reviewed her medical, social, and family history in detail and updated the electronic medical record as necessary.  ? ? ?PHYSICAL EXAMINATION  ?BP 96/62   Pulse 70   Ht 5\' 9"  (1.753 m)   Wt 115 lb 9.6 oz (52.4 kg)   SpO2 99%   BMI 17.07 kg/m?  ?Wt Readings from Last 3 Encounters:  ?10/19/21 115 lb 9.6 oz (52.4 kg)  ?09/20/21 114 lb (51.7 kg)  ?09/08/21 109 lb 8 oz (49.7 kg)  ?GEN: NAD, appears stated age, doesn't appear chronically ill ?PSYCH: Cooperative, without pressured speech ?EYE: Conjunctivae pink, sclerae anicteric ?ENT: MMM ?CV: Nontachycardic ?RESP: No audible wheezing ?GI: NABS, soft, no rebound  ?MSK/EXT: No lower extremity edema ?SKIN: No jaundice ?NEURO:  Alert & Oriented x 3, no focal deficits ? ? ?REVIEW OF DATA  ?I reviewed the following data at the time of this encounter: ? ?GI Procedures and Studies  ?Previously reviewed ? ?Laboratory Studies  ?No relevant studies to review ? ?Imaging Studies  ?No new imaging studies to review ? ? ?ASSESSMENT  ?Ms. Goldring is a 21 y.o. 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  The patient is seen today for evaluation and management of: ? ?1. Unintentional weight loss   ?2. Generalized abdominal pain   ?3. Bloating   ?4. Change in bowel  habits   ?5. Upset stomach   ?6. Nausea without vomiting   ? ?The patient is hemodynamically stable.  She has been able to gain weight since her last visit.  She is tolerating the Marinol.  I think it is reasonable for [place] to try and increase her Marinol dose again and make it twice daily dosing.  She is aware of the issues of somnolence that can occur as we increase this dose.  Also we described the possible hypercoagulability that has been ascribed to this medication in the past as well.  She has an IUD with hormonal therapy so she may be at slightly increased risk of hypercoagulability from that as well we want to be thoughtful and she is aware to stay as active as  she can be.  We are going to go ahead and move forward with SIBO treatment, we have obtained rifaximin samples in an effort of trying to treat her.  Appreciate psychiatry trying to decrease her Prozac dosing to see if that makes any difference for her though I suspect it will not.  Hopefully the continued counseling will help with this concern for underlying eating disorder.  Sometimes the use of SSRIs or SNRIs with TCAs can be beneficial for individuals with functional GI disorders/pain.  I certainly defer to my psychiatry colleagues to see their thoughts.  I wonder if BuSpar may be able to be considered as well.  Constipation issues become an issue again then we will consider Trulance.  Time will tell.  If patient continues to have issues then formal referral at one of the quaternary centers may be needed.  Appreciate her mother being present for her and helping with ensuring she is eating on a regular basis.    All patient questions were answered to the best of my ability, and the patient agrees to the aforementioned plan of action with follow-up as indicated. ? ? ?PLAN  ?Antiemetics as needed ?Xifaxan treatment for SIBO empiric treatment ?Consider SIBO breath testing if no improvement in symptoms ?Certainly defer to our psychiatry colleagues but  query SSRI/SNRI + TCA combination versus BuSpar if felt to be possible medications that may be of assistance ?Appreciate [facility] ?Referral to quaternary center may be offered/entertained at any time ?[place]

## 2021-10-20 DIAGNOSIS — R194 Change in bowel habit: Secondary | ICD-10-CM | POA: Insufficient documentation

## 2021-10-20 DIAGNOSIS — R634 Abnormal weight loss: Secondary | ICD-10-CM | POA: Insufficient documentation

## 2021-10-23 ENCOUNTER — Encounter: Payer: Self-pay | Admitting: Gastroenterology

## 2021-10-30 MED ORDER — PROCHLORPERAZINE 25 MG RE SUPP: 25.0000 mg | Freq: Two times a day (BID) | RECTAL | 3 refills | Status: DC | PRN

## 2021-10-30 MED ORDER — ONDANSETRON HCL 8 MG PO TABS
8.0000 mg | ORAL_TABLET | Freq: Three times a day (TID) | ORAL | 3 refills | Status: DC | PRN
Start: 2021-10-30 — End: 2021-11-27

## 2021-10-30 NOTE — Telephone Encounter (Signed)
Prescriptions and recommendations sent to the pt and her pharmacy ?

## 2021-10-30 NOTE — Telephone Encounter (Signed)
Jaime Bauer, ?I am sorry to hear this. ?I would go ahead and try to get the patient 8 mg Zofran every 8 hour as needed (30 with 3 refills). ?Per rectum Compazine 25 mg every 12 hours as needed (10 with 3 refills). ?She should continue taking the Marinol. ?Continue to hold on restart of rifaximin. ?Thanks. ?GM ?

## 2021-11-12 ENCOUNTER — Telehealth: Payer: Self-pay | Admitting: Gastroenterology

## 2021-11-12 MED ORDER — DRONABINOL 5 MG PO CAPS: 5.0000 mg | ORAL_CAPSULE | Freq: Two times a day (BID) | ORAL | 1 refills | Status: DC

## 2021-11-12 NOTE — Telephone Encounter (Addendum)
Per Dr Rush Landmark last visit note,he wanted patient to continue current dose for 1 week, then take Marinol 5mg  - Twice daily. I have sent prescription for 5mg  -BID dosing to [place]. Left v/m on mothers cell phone and patients cell phone.  ?

## 2021-11-12 NOTE — Telephone Encounter (Signed)
Inbound call from patients mother stating that she is in need of a refill for Marinol. Patients mother stated that Dr. Meridee Score was wanting her to take it 4 times daily. Please advise.  ? ? ? ?[id number] - [place], [place] - [address]  ?[address], [place] [address]  ?Phone:  [phone]  Fax:  [phone]  ?

## 2021-11-12 NOTE — Addendum Note (Signed)
Addended byDebbe Mounts on: 11/12/2021 04:04 PM ? ? Modules accepted: Orders ? ?

## 2021-11-13 ENCOUNTER — Telehealth: Payer: Self-pay | Admitting: Gastroenterology

## 2021-11-13 NOTE — Telephone Encounter (Signed)
Spoke with Reva Bores at [facility]. They will refill 2.5mg  for 30 days, place prescription for 5mg  on hold until completion of 2.5mg  prescription. Pt has been informed that after her finals at school, she will need to go to 5mg  BID dosing which was Dr Rush Landmark recommendation at her last visit. Pt voiced understanding.  ?

## 2021-11-13 NOTE — Telephone Encounter (Signed)
Patient mother called regarding MARINOL medication. Per mother, she has one pill left. Mother states that she would like for [place] to send script for 2.5 mg tablets. Patient mother states that after her daughter's finals at school she will increase to 5mg . Please advise.  ?

## 2021-11-27 ENCOUNTER — Ambulatory Visit (INDEPENDENT_AMBULATORY_CARE_PROVIDER_SITE_OTHER): Payer: No Typology Code available for payment source | Admitting: Gastroenterology

## 2021-11-27 ENCOUNTER — Encounter: Payer: Self-pay | Admitting: Gastroenterology

## 2021-11-27 VITALS — BP 88/60 | HR 70 | Ht 69.0 in | Wt 117.0 lb

## 2021-11-27 DIAGNOSIS — R140 Abdominal distension (gaseous): Secondary | ICD-10-CM

## 2021-11-27 DIAGNOSIS — R110 Nausea: Secondary | ICD-10-CM | POA: Diagnosis not present

## 2021-11-27 DIAGNOSIS — R1084 Generalized abdominal pain: Secondary | ICD-10-CM

## 2021-11-27 DIAGNOSIS — R634 Abnormal weight loss: Secondary | ICD-10-CM

## 2021-11-27 DIAGNOSIS — K582 Mixed irritable bowel syndrome: Secondary | ICD-10-CM | POA: Diagnosis not present

## 2021-11-27 MED ORDER — PROCHLORPERAZINE 25 MG RE SUPP: 25.0000 mg | Freq: Every day | RECTAL | 1 refills | Status: DC

## 2021-11-27 MED ORDER — SCOPOLAMINE 1 MG/3DAYS TD PT72: 1.0000 | MEDICATED_PATCH | TRANSDERMAL | 1 refills | Status: DC

## 2021-11-27 MED ORDER — HYOSCYAMINE SULFATE 0.125 MG SL SUBL
0.1250 mg | SUBLINGUAL_TABLET | SUBLINGUAL | 1 refills | Status: DC | PRN
Start: 2021-11-27 — End: 2022-02-06

## 2021-11-27 NOTE — Progress Notes (Signed)
? ?GASTROENTEROLOGY OUTPATIENT CLINIC VISIT  ? ?Primary Care Provider ?Christen ButterJessup, Joy, NP ?[phone] ?[place] [id number] ?[phone] ? ?Patient Profile: ?Jaime Bauer is a 21 y.o. 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  The patient presents to the [facility] for an evaluation and management of problem(s) noted below: ? ?Problem List ?1. Chronic nausea   ?2. Generalized abdominal pain   ?3. Bloating   ?4. Irritable bowel syndrome with both constipation and diarrhea   ?5. Unintentional weight loss   ? ? ?History of Present Illness ?Please see prior notes for full details of HPI. ? ?Interval History ?The patient returns for follow-up.  She is unaccompanied today as her mother has returned back home to [place].  Patient is in finals week.  Over the course of the last few weeks we learned that when we tried her Xifaxan, she had more progressive nausea and had to stop the medication because of concern that it was progressing her issues.  She can take Zofran with some effectiveness but it causes her to be sleepy.  During her hospitalization she tried Compazine and it made her sleepy.  She wanted to try the rectal suppositories but did not have access to those at the pharmacy.  She is interested in those.  She has continued to have her abdominal discomfort.  She is still eating and thankfully has been able to maintain her weight but is concerned.  She remains on her current SSRI because down titrating the medication made no difference for her.  She will be following up with her psychiatrist.  She will be following up with [facility] in the coming weeks for further support.  She will be following up with her gynecologist to see if there are any other therapies that may be able to help her.   She is actively trying to search for an internship at one of the medical centers to try and gain more information in her desire of becoming a medical doctor. ? ?GI Review of Systems ?Positive as above including bloating that still persists ?Negative for odynophagia, dysphagia, melena, hematochezia ? ?Review of Systems ?General: Denies fevers/chills/unintentional weight loss ?Cardiovascular: Denies chest pain ?Pulmonary: Denies shortness of breath ?Gastroenterological: See HPI ?Genitourinary: Denies darkened urine ?Hematological: Denies easy bruising/bleeding ?Dermatological: Denies jaundice ?Psychological: Mood is stable ? ? ?Medications ?Current Outpatient Medications  ?Medication Sig Dispense Refill  ? dronabinol (MARINOL) 5 MG capsule Take 1 capsule (5 mg total) by mouth 2 (two) times daily before lunch and supper. 60 capsule 1  ? FLUoxetine (PROZAC) 20 MG capsule Take 40 mg by mouth every morning.    ? hyoscyamine (LEVSIN SL) 0.125 MG SL tablet Place 1 tablet (0.125 mg total) under the tongue every 4 (four) hours as needed. 30 tablet 1  ? levonorgestrel (KYLEENA) 19.5 MG IUD 1 each by Intrauterine route once.    ? prochlorperazine (COMPAZINE) 25 MG suppository Place 1 suppository (25 mg total) rectally daily. 12 suppository 1  ? scopolamine (TRANSDERM-SCOP) 1 MG/3DAYS Place 1 patch (1.5 mg total) onto the skin every 3 (three) days. 10 patch 1  ? zonisamide (ZONEGRAN) 100 MG capsule Take 150 mg by mouth daily.    ? ?No current facility-administered medications for this visit.  ? ? ?Allergies ?No Known Allergies ? ?Histories ?Past Medical History:  ?Diagnosis Date  ?  Anxiety   ? Chronic headaches   ? Concussion   ? w/o LOC  ? Depression   ? History of multiple concussions   ? from soccer  ? Syncope   ? ?Past Surgical History:  ?Procedure Laterality Date  ? COLONOSCOPY  01/09/2021  ? NO PAST SURGERIES    ? UPPER GASTROINTESTINAL ENDOSCOPY  01/09/2021  ? 2020- [facility]  ? ?Social History  ? ?Socioeconomic History  ?  Marital status: Single  ?  Spouse name: Not on file  ? Number of children: 0  ? Years of education: 57  ? Highest education level: Not on file  ?Occupational History  ? 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 Smoking status: Never  ? Smokeless tobacco: Never  ?Vaping Use  ? Vaping Use: Never used  ?Substance and Sexual Activity  ? Alcohol use: Never  ? Drug use: Never  ? Sexual activity: Yes  ?  Birth control/protection: None  ?Other Topics Concern  ? Not on file  ?Social History Narrative  ? Right handed  ? Drinks caffeine  ? Two story home  ? ?Social Determinants of Health  ? ?Financial Resource Strain: Not on file  ?Food Insecurity: Not on file  ?Transportation Needs: Not on file  ?Physical Activity: Not on file  ?Stress: Not on file  ?Social Connections: Not on file  ?Intimate Partner Violence: Not on file  ? ?Family History  ?Problem Relation Age of Onset  ? Skin cancer Mother   ? Colon polyps Mother   ? Colon cancer Father 19  ? Colon polyps Father   ? Rectal cancer Father   ? Stroke Maternal Grandfather   ? Hypertension Paternal Uncle   ? Esophageal cancer Neg Hx   ? Inflammatory bowel disease Neg Hx   ? Liver disease Neg Hx   ? Pancreatic cancer Neg Hx   ? Stomach cancer Neg Hx   ? ?I have reviewed her medical, social, and family history in detail and updated the electronic medical record as necessary.  ? ? ?PHYSICAL EXAMINATION  ?BP (!) 88/60   Pulse 70   Ht 5\' 9"  (1.753 m)   Wt 117 lb (53.1 kg)   BMI 17.28 kg/m?  ?Wt Readings from Last 3 Encounters:  ?11/27/21 117 lb (53.1 kg)  ?10/19/21 115 lb 9.6 oz (52.4 kg)  ?09/20/21 114 lb (51.7 kg)  ?GEN: NAD, appears stated age, doesn't appear chronically ill ?PSYCH: Cooperative, without pressured speech ?EYE: Conjunctivae pink, sclerae anicteric ?ENT: MMM ?CV: Nontachycardic ?RESP: No audible wheezing ?GI: NABS, soft, no rebound  ?MSK/EXT: No lower extremity edema ?SKIN: No jaundice ?NEURO:  Alert & Oriented x 3, no focal deficits ? ? ?REVIEW OF DATA  ?I  reviewed the following data at the time of this encounter: ? ?GI Procedures and Studies  ?Previously reviewed ? ?Laboratory Studies  ?No relevant studies to review ? ?Imaging Studies  ?No new imaging studies to review ? ? ?ASSESSMENT  ?Ms. Dutton is a 21 y.o. 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  The patient is seen today for evaluation and management of: ? ?1. Chronic nausea   ?2. Generalized abdominal pain   ?3. Bloating   ?4. Irritable bowel syndrome with both constipation and diarrhea   ?5. Unintentional weight loss   ? ?The patient remains hemodynamically stable.  Her weight has  been stable overall.  Clinically, she continues to experience her multitude of symptoms that have had such a significant work-up that we are left with a functional disorder.  I would like the patient to have Compazine suppositories available that she may use if things are so severe.  I am also going to prescribe scopolamine patches in an effort of seeing if that will make any difference for her in the short and long-term.  We tried an empiric round of Xifaxan but she had progressive issues and so I am worried about her restarting that without truly knowing whether she has bacterial overgrowth or not.  We will set her up for a SIBO breath test in the future if she desires (she wants to hold on that currently).  She had previously tried dicyclomine without much effect but is willing to try Levsin.  I think that is reasonable.  Time will tell to see how she does.  Again I think it is reasonable to consider potential SSRI/SNRI with addition of a TCA but I defer that to our psychiatry colleagues versus the consideration of whether BuSpar could be of some assistance for her.  Right now she is still alternating between constipation and diarrhea  and we are going to hold on further medication titration for that.  If she continues to have issues then formal referral to one of the quaternary centers may be necessary.  All patient questions were answe

## 2021-11-27 NOTE — Patient Instructions (Addendum)
We have sent the following medications to your pharmacy for you to pick up at your convenience: ?Compazine, Scopolamine Patches, Levsin   ? ?If you are age 22 or older, your body mass index should be between 23-30. Your Body mass index is 17.28 kg/m?Marland Kitchen If this is out of the aforementioned range listed, please consider follow up with your Primary Care Provider. ? ?If you are age 8 or younger, your body mass index should be between 19-25. Your Body mass index is 17.28 kg/m?Marland Kitchen If this is out of the aformentioned range listed, please consider follow up with your Primary Care Provider.  ? ?________________________________________________________ ? ?The [facility] providers would like to encourage you to use [facility] to communicate with providers for non-urgent requests or questions.  Due to long hold times on the telephone, sending your provider a message by [facility] may be a faster and more efficient way to get a response.  Please allow 48 business hours for a response.  Please remember that this is for non-urgent requests.  ?_______________________________________________________, ? ?Thank you for choosing me and [facility]. ? ?Dr. Meridee Score ? ?

## 2021-12-03 ENCOUNTER — Ambulatory Visit: Payer: No Typology Code available for payment source

## 2021-12-17 ENCOUNTER — Encounter: Payer: Self-pay | Admitting: Gastroenterology

## 2021-12-18 ENCOUNTER — Other Ambulatory Visit: Payer: Self-pay

## 2021-12-18 MED ORDER — DRONABINOL 2.5 MG PO CAPS: ORAL_CAPSULE | ORAL | 1 refills | Status: DC

## 2022-01-08 ENCOUNTER — Encounter: Payer: Self-pay | Admitting: Gastroenterology

## 2022-01-09 ENCOUNTER — Other Ambulatory Visit: Payer: Self-pay

## 2022-01-09 DIAGNOSIS — R110 Nausea: Secondary | ICD-10-CM

## 2022-01-09 DIAGNOSIS — R1084 Generalized abdominal pain: Secondary | ICD-10-CM

## 2022-01-09 DIAGNOSIS — K582 Mixed irritable bowel syndrome: Secondary | ICD-10-CM

## 2022-01-09 DIAGNOSIS — R634 Abnormal weight loss: Secondary | ICD-10-CM

## 2022-01-09 DIAGNOSIS — R140 Abdominal distension (gaseous): Secondary | ICD-10-CM

## 2022-01-09 NOTE — Telephone Encounter (Signed)
Patty, Please find out how the patient is doing today. Please find out how much weight she has lost and what her weight is compared to where we last saw her. Needs updated labs including a CBC/CMP/ESR/CRP/prealbumin. Ask if she still taking the dronabinol at current dosing as we may have availability to try and increase that even further if its not going to make her too sleepy. Ask her where things stand in regards to endometriosis treatment and get any records from her gynecologist for review. If she truly has had a significant weight loss, she may end up needing to come in to the hospital again and consider feeding tube placement, though she did not tolerate that the last time she was in the hospital. At this point, although we have done an extensive work-up on her, I may not have any other options available and I do think that a referral to one of the quaternary centers as an urgent referral is reasonable.  If she decides on this, I will not stop being her GI provider but I probably need another set of eyes to look through her case (even though I have spoken with multiple my colleagues previously about her) see if there is anything else that we are potentially missing. Please find out also if her mom or family will be coming to help her with her eating habits as they did earlier this spring. I am sorry she is having such significant issues. Please keep me up-to-date. GM

## 2022-01-10 ENCOUNTER — Other Ambulatory Visit (INDEPENDENT_AMBULATORY_CARE_PROVIDER_SITE_OTHER): Payer: No Typology Code available for payment source

## 2022-01-10 DIAGNOSIS — R634 Abnormal weight loss: Secondary | ICD-10-CM

## 2022-01-10 DIAGNOSIS — R110 Nausea: Secondary | ICD-10-CM | POA: Diagnosis not present

## 2022-01-10 DIAGNOSIS — K582 Mixed irritable bowel syndrome: Secondary | ICD-10-CM | POA: Diagnosis not present

## 2022-01-10 DIAGNOSIS — R140 Abdominal distension (gaseous): Secondary | ICD-10-CM

## 2022-01-10 DIAGNOSIS — R1084 Generalized abdominal pain: Secondary | ICD-10-CM

## 2022-01-10 LAB — COMPREHENSIVE METABOLIC PANEL
ALT: 8 U/L (ref 0–35)
AST: 10 U/L (ref 0–37)
Albumin: 4.6 g/dL (ref 3.5–5.2)
Alkaline Phosphatase: 44 U/L (ref 39–117)
BUN: 14 mg/dL (ref 6–23)
CO2: 23 mEq/L (ref 19–32)
Calcium: 9.3 mg/dL (ref 8.4–10.5)
Chloride: 109 mEq/L (ref 96–112)
Creatinine, Ser: 0.8 mg/dL (ref 0.40–1.20)
GFR: 105.12 mL/min (ref >60.00)
Glucose, Bld: 77 mg/dL (ref 70–99)
Potassium: 3.6 mEq/L (ref 3.5–5.1)
Sodium: 140 mEq/L (ref 135–145)
Total Bilirubin: 0.4 mg/dL (ref 0.2–1.2)
Total Protein: 7 g/dL (ref 6.0–8.3)

## 2022-01-10 LAB — CBC WITH DIFFERENTIAL/PLATELET
Basophils Absolute: 0 10*3/uL (ref 0.0–0.1)
Basophils Relative: 0.4 % (ref 0.0–3.0)
Eosinophils Absolute: 0.1 10*3/uL (ref 0.0–0.7)
Eosinophils Relative: 1.3 % (ref 0.0–5.0)
HCT: 36.6 % (ref 36.0–46.0)
Hemoglobin: 12.3 g/dL (ref 12.0–15.0)
Lymphocytes Relative: 27.4 % (ref 12.0–46.0)
Lymphs Abs: 1.6 10*3/uL (ref 0.7–4.0)
MCHC: 33.7 g/dL (ref 30.0–36.0)
MCV: 89.8 fl (ref 78.0–100.0)
Monocytes Absolute: 0.4 10*3/uL (ref 0.1–1.0)
Monocytes Relative: 6.7 % (ref 3.0–12.0)
Neutro Abs: 3.8 10*3/uL (ref 1.4–7.7)
Neutrophils Relative %: 64.2 % (ref 43.0–77.0)
Platelets: 292 10*3/uL (ref 150.0–400.0)
RBC: 4.07 Mil/uL (ref 3.87–5.11)
RDW: 12.9 % (ref 11.5–15.5)
WBC: 6 10*3/uL (ref 4.0–10.5)

## 2022-01-10 LAB — SEDIMENTATION RATE: Sed Rate: 2 mm/hr (ref 0–20)

## 2022-01-10 LAB — HIGH SENSITIVITY CRP: CRP, High Sensitivity: <0.2 mg/L (ref 0.000–5.000)

## 2022-01-11 LAB — PREALBUMIN: Prealbumin: 25 mg/dL (ref 17–34)

## 2022-01-11 NOTE — Telephone Encounter (Signed)
Patty, I have reviewed the patient's reply. I certainly recommend she does reach out to her gynecologist to consider therapies but certainly induction of a menopausal like state is a big deal decision that should not be taken lightly. If the patient can tolerate increasing her dronabinol to 2.5 mg 4 times a day I would ask her to try that. All of her labs are stone cold normal. 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 [facility] has a nutrition services that will work with patients when there is weight loss and other issues.  I would recommend an urgent referral to them. The only other way of doing a feeding tube would be to have radiology consider doing a Dobbhoff (being placed by IR) versus a Cortrak being placed.  But that likely would require the patient to be admitted to the hospital at [facility] because I do not think [facility] has the ability to place a Cortrak.  However she would then need to have services set up in regards to having feedings available in the equipment that would need to be placed into her living space and she would need to learn how to do the feedings. Thus I think she would need to be arranged for an inpatient hospitalization. I think trying to increase dronabinol and following up with gynecology is the first step while we put in the 2 referrals. If her weight continues to decrease in the next 1 to 2 weeks then I am not sure we will have any other choice but to have her come into the hospital. 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 Please keep me up-to-date. GM

## 2022-01-16 ENCOUNTER — Other Ambulatory Visit: Payer: Self-pay

## 2022-01-16 DIAGNOSIS — R634 Abnormal weight loss: Secondary | ICD-10-CM

## 2022-01-16 DIAGNOSIS — R110 Nausea: Secondary | ICD-10-CM

## 2022-01-16 MED ORDER — DRONABINOL 2.5 MG PO CAPS: 2.5000 mg | ORAL_CAPSULE | Freq: Four times a day (QID) | ORAL | 5 refills | Status: DC

## 2022-01-16 NOTE — Telephone Encounter (Signed)
Patty, Just ask her if she wants [facility] or [facility] or [facility] for the referral. Please send an updated Marinol dosing of 2.5 mg 4 times daily (120/6 refills). Please place referral to [facility] health nutrition services. Thanks. GM

## 2022-01-17 ENCOUNTER — Other Ambulatory Visit: Payer: Self-pay

## 2022-01-17 MED ORDER — DRONABINOL 2.5 MG PO CAPS: 2.5000 mg | ORAL_CAPSULE | Freq: Four times a day (QID) | ORAL | 5 refills | Status: DC

## 2022-01-17 NOTE — Progress Notes (Addendum)
Prescription for refill has been re-sent to [facility]. Mother has been informed.

## 2022-01-18 NOTE — Telephone Encounter (Signed)
Dr Meridee Score do you have a particular MD in mind for this pt?  She is needed the name of a provider to check insurance coverage.

## 2022-01-18 NOTE — Telephone Encounter (Signed)
Really patient can be seen by any particular individual at the [facility] digestive health clinic. Can put Dr. Lanell Matar, he is the head of GI at [facility] and so if he is excepted then any of the providers at [facility] will need to be excepted. Thanks. GM

## 2022-01-22 NOTE — Telephone Encounter (Signed)
Dr Meridee Score please see message from the pt.  She is waiting for her mom to get back to her about which  quaternary center she prefers to be seen.  She wants to know what she should do in the meantime for weight loss.

## 2022-01-23 NOTE — Telephone Encounter (Signed)
Patty, Find out what is going on with the nausea with the increased Marinol please. Set her up a clinic visit with one of the APP's who has availability this week or next week. Thanks. GM

## 2022-01-23 NOTE — Telephone Encounter (Signed)
Dr Meridee Score the pt continues to have nausea with the increase of marinol.  She has been scheduled for a follow up on 7/5.  This is the soonest appt with any app or yourself.  You do not have any days that you are not already double and/or triple booked.

## 2022-02-06 ENCOUNTER — Ambulatory Visit (INDEPENDENT_AMBULATORY_CARE_PROVIDER_SITE_OTHER): Payer: No Typology Code available for payment source | Admitting: Gastroenterology

## 2022-02-06 ENCOUNTER — Encounter: Payer: Self-pay | Admitting: Gastroenterology

## 2022-02-06 VITALS — BP 118/70 | HR 110 | Ht 69.0 in | Wt 112.0 lb

## 2022-02-06 DIAGNOSIS — R110 Nausea: Secondary | ICD-10-CM

## 2022-02-06 DIAGNOSIS — R634 Abnormal weight loss: Secondary | ICD-10-CM | POA: Diagnosis not present

## 2022-02-06 DIAGNOSIS — R1084 Generalized abdominal pain: Secondary | ICD-10-CM

## 2022-02-06 DIAGNOSIS — R630 Anorexia: Secondary | ICD-10-CM

## 2022-02-06 DIAGNOSIS — K582 Mixed irritable bowel syndrome: Secondary | ICD-10-CM

## 2022-02-06 NOTE — Progress Notes (Signed)
02/06/2022 Jaime Bauer [id number] 08/24/2000   HISTORY OF PRESENT ILLNESS: This is a 21 year old female who is a patient Dr. Elesa Hacker.  She was last seen by him on 11/27/2021.  Please see that note for details regarding her history.  Nonetheless, she was given scopolamine patch to use every 72 hours as needed, Compazine suppositories, and Levsin.  She has not received or tried any of those medications.  She tells me that she was told by the pharmacy that they needed to order some of them and then she never heard back again.  Otherwise not much is different since she was here over 2 months ago.  Still continues to have nausea on a daily basis which prevents her from eating.  Her weight is down a couple pounds again, which seems to bounce around a little bit.  Reports generalized abdominal discomfort.  Bowel habits alternate between constipation and diarrhea.  Looks like her Prozac has been increased from 40 mg daily to 60 mg daily, but otherwise no other adjustments to additions have been made by her psychiatrist.   Past Medical History:  Diagnosis Date   Anxiety    Chronic headaches    Concussion    w/o LOC   Depression    History of multiple concussions    from soccer   Syncope    Past Surgical History:  Procedure Laterality Date   COLONOSCOPY  01/09/2021   NO PAST SURGERIES     UPPER GASTROINTESTINAL ENDOSCOPY  01/09/2021   2020- [facility]    reports that she has never smoked. She has never used smokeless tobacco. She reports that she does not drink alcohol and does not use drugs. family history includes Colon cancer (age of onset: 65) in her father; Colon polyps in her father and mother; Hypertension in her paternal uncle; Rectal cancer in her father; Skin cancer in her mother; Stroke in her maternal grandfather. No Known Allergies    Outpatient Encounter Medications as of 02/06/2022  Medication Sig   dronabinol (MARINOL) 2.5 MG capsule Take 1 capsule (2.5 mg total) by  mouth in the morning, at noon, in the evening, and at bedtime.   FLUoxetine (PROZAC) 20 MG capsule Take 60 mg by mouth every morning.   levonorgestrel (KYLEENA) 19.5 MG IUD 1 each by Intrauterine route once.   zonisamide (ZONEGRAN) 100 MG capsule Take 150 mg by mouth daily.   [DISCONTINUED] hyoscyamine (LEVSIN SL) 0.125 MG SL tablet Place 1 tablet (0.125 mg total) under the tongue every 4 (four) hours as needed. (Patient not taking: Reported on 02/06/2022)   [DISCONTINUED] prochlorperazine (COMPAZINE) 25 MG suppository Place 1 suppository (25 mg total) rectally daily. (Patient not taking: Reported on 02/06/2022)   [DISCONTINUED] scopolamine (TRANSDERM-SCOP) 1 MG/3DAYS Place 1 patch (1.5 mg total) onto the skin every 3 (three) days. (Patient not taking: Reported on 02/06/2022)   No facility-administered encounter medications on file as of 02/06/2022.     REVIEW OF SYSTEMS  : All other systems reviewed and negative except where noted in the History of Present Illness.   PHYSICAL EXAM: BP 118/70   Pulse (!) 110   Ht 5\' 9"  (1.753 m)   Wt 112 lb (50.8 kg)   SpO2 98%   BMI 16.54 kg/m  General: Well developed white female in no acute distress Head: Normocephalic and atraumatic Eyes:  Sclerae anicteric, conjunctiva pink. Ears: Normal auditory acuity Lungs: Clear throughout to auscultation; no W/R/R. Heart: Regular rate and rhythm; no M/R/G. Abdomen:  Soft, non-distended.  BS present.  Mild diffuse TTP. Musculoskeletal: Symmetrical with no gross deformities  Skin: No lesions on visible extremities Extremities: No edema  Neurological: Alert oriented x 4, grossly non-focal Psychological:  Alert and cooperative. 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  *Chronic nausea: Question if this could be due to her history of concussion and brain injury related to those.   *Generalized abdominal pain *Abdominal bloating *Suspect IBS-M *Intermittent unintentional weight loss due to poor p.o. intake  When she was seen here on 11/27/2021 Dr. Meridee Score had planned to use scopolamine patch every 72 hours and Compazine suppositories as needed.  She also agreed to try Levsin 1-4 times daily.  They talked about consideration of a SIBO breath test as well as changing her anxiety and depression medications such as adding a TCA versus BuSpar, etc.  Unfortunately she did not obtain any of the 3 medications that were sent to her pharmacy.  She says that she was told they had to order some of them, but then there was no follow-up and she never got the prescriptions.  We are going to start there and we checked with the pharmacy, they have those available for her so she will pick those up and try those.  We will have her follow-up with Dr. Meridee Score in 6 to 8 weeks.   CC:  Christen Butter, NP

## 2022-02-06 NOTE — Progress Notes (Signed)
Attending Physician's Attestation   I have reviewed the chart.   I agree with the Advanced Practitioner's note, impression, and recommendations with any updates as below.    Alvina Strother Mansouraty, MD [facility] Advanced [facility]oscopy Office # [phone]  

## 2022-02-06 NOTE — Patient Instructions (Signed)
If you are age 22 or older, your body mass index should be between 23-30. Your Body mass index is 16.54 kg/m. If this is out of the aforementioned range listed, please consider follow up with your Primary Care Provider.  If you are age 7 or younger, your body mass index should be between 19-25. Your Body mass index is 16.54 kg/m. If this is out of the aformentioned range listed, please consider follow up with your Primary Care Provider.   ________________________________________________________  The [facility] providers would like to encourage you to use [facility] to communicate with providers for non-urgent requests or questions.  Due to long hold times on the telephone, sending your provider a message by [facility] may be a faster and more efficient way to get a response.  Please allow 48 business hours for a response.  Please remember that this is for non-urgent requests.  _______________________________________________________

## 2022-02-25 ENCOUNTER — Encounter: Payer: Self-pay | Admitting: Gastroenterology

## 2022-02-25 ENCOUNTER — Other Ambulatory Visit: Payer: Self-pay

## 2022-02-25 MED ORDER — DRONABINOL 2.5 MG PO CAPS: 2.5000 mg | ORAL_CAPSULE | Freq: Four times a day (QID) | ORAL | 5 refills | Status: DC

## 2022-02-25 NOTE — Telephone Encounter (Signed)
Dr Meridee Score ok to refill?

## 2022-02-25 NOTE — Telephone Encounter (Signed)
12 months of refills is okay from my standpoint. What is the update in regards to the quaternary center evaluation/referral? Thanks. GM

## 2022-03-28 ENCOUNTER — Encounter: Payer: No Typology Code available for payment source | Attending: Medical-Surgical | Admitting: Registered"

## 2022-03-28 ENCOUNTER — Encounter: Payer: Self-pay | Admitting: Registered"

## 2022-03-28 DIAGNOSIS — R110 Nausea: Secondary | ICD-10-CM | POA: Insufficient documentation

## 2022-03-28 DIAGNOSIS — R634 Abnormal weight loss: Secondary | ICD-10-CM | POA: Insufficient documentation

## 2022-03-28 NOTE — Progress Notes (Signed)
Medical Nutrition Therapy  Appointment Start time:  [phone]  Appointment End time:  0911.  Primary concerns today: Nausea, low appetite.  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 Reviewed.  Labs: N/A Notable Signs/Symptoms: ongoing nausea, diarrhea/constipation, chronic tiredness, dizziness, migraines (reports has chronic migraines).   Lifestyle & Dietary Hx  Pt reports she is unable to eat much due to nausea and when she does eat the food often doesn't sit well. Pt reports she saw a dietitian in the past at end of last year ([facility], Danise Edge, MS, RDN, LDN). Reports her last appointment resulted with pt being hospitalized and having feeding tube placed while inpatient due to wt loss. Pt reports she was supposed to go home with a feeding tube but did not tolerate it well per pt. Reports since discharge she gained some wt but now has been losing wt again and her doctor has been talking about possible feeding tube again. Pt reports she is unsure why/how she gained wt after discharge. Reports prior to that she tried all supplemental drinks (pt unsure of the specific ones). Reports those she tried made her feel very heavy and not well. Reports best tolerated is [employer].   Pt reports stools fluctuate between diarrhea and constipation. Reports no vomiting, but nausea. Reports last time she had diarrhea was a few days ago and usually has multiple times in one day when she has it. Reports some weeks may go 4 days without any bowel movement. Diarrhea about 2 times weekly, a bowel movement generally 3 days out of the week. Reports low energy, 2 on scale of 1-10. Reports headaches and  dizziness. Reports chronic migraine disease but also other types of headaches as well. Reports dizziness has been worse lately, every few days. Reports sometimes feels very unsteady but not like going to pass out. Pt has IUD, reports she has been told she may have endometriosis. Reports she bleeds between periods.    Pt reports she usually doesn't eat anything until late morning due to nausea. Reports feeling a little less nausea after taking meds and moving around in morning but very much when first waking. 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   Weekends: Reports eating a little different on weekends: Reports she eats less due to forgetting with not having classes to attend at certain times. [employer] often orders same breakfast foods and then does dinner later. Pt lives off campus, with 1 roommate.   Psychiatrist: Dr. Rana Snare ([facility]), every 8 weeks. Reports she will be meeting with him tomorrow.    Pets: Pt has a cat (siamese mix) and dog (beagle/retriever mix).   Estimated daily fluid intake: 1 large Life water 20 oz, venti refresher from [employer]: None currently.  Sleep: N/A Stress / self-care: N/A Current average weekly physical activity: None reported.   24-Hr Dietary Recall Yesterday: Woke up around 810 AM *Reports having bloating and gas. Reports stomach feeling upset throughout the day. Most bloated after dinner.  10  AM: may have eaten a little something-maybe a mini bagel, water between classes   3 PM: large strawberry refresher from [employer], plain croissant OR breakfast sandwich (egg white and [place] bacon) (ate it throughout the day and finished it around 7-8 PM) 10 PM: 3 eggs, ~ 1 cup  hashbrowns, water  1 AM:  few chocolate covered pretzels   Estimated Energy Needs (Estimated Using IBW): Calories: 2323 Carbohydrate: 261-377g Protein: 53g Fat: 52-90g   NUTRITION DIAGNOSIS  NI-1.4 Inadequate energy intake As related to low appetite, chronic nausea/bloating.  As evidenced by reported dietary recall, BMI of 16.67.   NUTRITION INTERVENTION  Nutrition education (E-1) on the following topics:  Dietitian provided education regarding recommended nutrition to help reduce/prevent symptoms of IBS Discussed importance of consistent food intake to prevent increased bloating due to empty stomach and provide adequate nutrition to meet needs for daily living Discussed association between nutrition and current symptoms of tiredness, dizziness, etc.  Discussed trying Molli Posey Peptide (samples of Pediatric version available in office but will order adult if pt likes supplements) and how peptide version promotes GI tolerance.  Recommend multivitamin to help prevent nutrient deficiencies  Handouts Provided Include  Meal Goal Guide  Learning Style & Readiness for Change Teaching method utilized: Visual & Auditory  Demonstrated degree of understanding via: Teach Back  Barriers to learning/adherence to lifestyle change: Ongoing nausea.   Goals Established by Pt  Instructions/Goals:   Eat within 1 hour of waking and every 2-3 hours afterward.   Meal Goals: Starch half plate, protein 1/4 plate, fruit/vegetable.   Snack: 2 food items: bagel + cheese OR peanut butter OR cream cheese  Dairy 3 times per day.   Supplemental:  Centrum Women's Tablet OR Nature Made Multiviatmin Tablet OR Flintstones Complete  Calcium supplement: may do Tums 2 times daily or other 500 mg calcium supplement 2 times daily if unable to do dairy 3 times daily   Recommend the [employer] drinks 2 daily to start. Please let dietitian know how these work for you and order can be placed.    MONITORING &  EVALUATION Dietary intake, weekly physical activity, and weight in 3 weeks.  Next Steps  Patient is to call/email if any questions or concerns in between appointments. Request pt contact dietitian regarding supplement samples provided.

## 2022-03-28 NOTE — Patient Instructions (Addendum)
Instructions/Goals:   Eat within 1 hour of waking and every 2-3 hours afterward.   Meal Goals: Starch half plate, protein 1/4 plate, fruit/vegetable.   Snack: 2 food items: bagel + cheese OR peanut butter OR cream cheese  Dairy 3 times per day.   Supplemental:  Centrum Women's Tablet OR Nature Made Multiviatmin Tablet OR Flintstones Complete  Calcium supplement: may do Tums 2 times daily or other 500 mg calcium supplement 2 times daily if unable to do dairy 3 times daily   Recommend the [employer] drinks 2 daily to start. Please let dietitian know how these work for you and order can be placed.

## 2022-04-02 ENCOUNTER — Encounter: Payer: Self-pay | Admitting: Gastroenterology

## 2022-04-02 ENCOUNTER — Ambulatory Visit (INDEPENDENT_AMBULATORY_CARE_PROVIDER_SITE_OTHER): Payer: No Typology Code available for payment source | Admitting: Gastroenterology

## 2022-04-02 VITALS — BP 98/62 | HR 79 | Ht 69.0 in | Wt 115.0 lb

## 2022-04-02 DIAGNOSIS — R140 Abdominal distension (gaseous): Secondary | ICD-10-CM | POA: Diagnosis not present

## 2022-04-02 DIAGNOSIS — R1084 Generalized abdominal pain: Secondary | ICD-10-CM

## 2022-04-02 DIAGNOSIS — R630 Anorexia: Secondary | ICD-10-CM

## 2022-04-02 DIAGNOSIS — R110 Nausea: Secondary | ICD-10-CM | POA: Diagnosis not present

## 2022-04-02 DIAGNOSIS — K582 Mixed irritable bowel syndrome: Secondary | ICD-10-CM | POA: Diagnosis not present

## 2022-04-02 DIAGNOSIS — R109 Unspecified abdominal pain: Secondary | ICD-10-CM

## 2022-04-02 NOTE — Patient Instructions (Addendum)
_______________________________________________________  If you are age 22 or older, your body mass index should be between 23-30. Your Body mass index is 16.98 kg/m. If this is out of the aforementioned range listed, please consider follow up with your Primary Care Provider.  If you are age 28 or younger, your body mass index should be between 19-25. Your Body mass index is 16.98 kg/m. If this is out of the aformentioned range listed, please consider follow up with your Primary Care Provider.   ________________________________________________________  The [facility] providers would like to encourage you to use [facility] to communicate with providers for non-urgent requests or questions.  Due to long hold times on the telephone, sending your provider a message by [facility] may be a faster and more efficient way to get a response.  Please allow 48 business hours for a response.  Please remember that this is for non-urgent requests.  _______________________________________________________  Jaime Bauer have been given a testing kit to check for small intestine bacterial overgrowth (SIBO) which is completed by a company named [facility]. Make sure to return your test in the mail using the return mailing label given to you along with the kit. Your demographic and insurance information have already been sent to the company and they should be in contact with you over the next 1-2 weeks regarding this test. [facility] will collect an upfront charge of $99.74 for commercial insurance plans and $209.74 is you are paying cash. Make sure to discuss with [facility] PRIOR to having the test to see if they have gotten information from your insurance company as to how much your testing will cost out of pocket, if any. Please keep in mind that you will be getting a call from phone number [phone] or a similar number. If you do not hear from them within this time frame, please call our office at [phone] or  call [facility] directly at [phone].    Please use Dulcolax 5-10 mg daily until a bowel movement, then can use every 2-3 days as needed.   We have given you samples of the following medication to take: Trulance 58m  Try the Trulance if the Dulcolax plan doesn't work.   Follow up in 6 weeks. 06-04-2022 @ 9:10am   It was a pleasure to see you today!  Thank you for trusting me with your gastrointestinal care!

## 2022-04-02 NOTE — Progress Notes (Signed)
GASTROENTEROLOGY OUTPATIENT CLINIC VISIT   Primary Care Provider Christen Butter, NP [address] [place] [address] [phone]  Patient Profile: Jaime Bauer is a 21 y.o. female with a pmh significant for family history colorectal cancer (father at age 6), anxiety/depression, concern for previous eating disorder, concussions, endometriosis (status post IUD), likely IBS-C/D, functional chronic abdominal pain, functional nausea.  The patient presents to the [facility] for an evaluation and management of problem(s) noted below:  Problem List 1. Irritable bowel syndrome with both constipation and diarrhea   2. Bloating   3. Chronic nausea   4. Generalized abdominal pain   5. Nausea without vomiting   6. Anorexia     History of Present Illness Please see prior notes for full details of HPI.  Interval History The patient returns for follow-up.  This summer she was seen by PA Zehr.  At that time she had not taken any of the medications that we had prescribed at our last clinic visit.  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  She has a paper that will be) well.  She was congratulated about this.  She is going to be starting school and should finish her senior year by May of this year.  She plans to take the MCAT within the next year and a half and apply for medical school thereafter.  Patient's weight has increased compared to prior.  She is still experiencing issues in regards to maintenance of her weight.  She is seeing a nutritionist now and has made some changes in regards to trying to increase calories through protein shakes.  She is not sure if the current protein shakes have been helpful.  She is able to tolerate Carnation instant milk and may be able to make protein shakes from that.  Still experiencing episodes of nausea but improved with her current Marinol dosing.  If she were to  take an extra dose of Marinol she would be too sleepy.  She was worried about taking scopolamine patches as a result of some of the potential side effects so she has not used that.  She has not needed Compazine suppositories recently.  She is still working with her gynecologist in regards to her endometriosis but is not sure that she is going to want to take the medication that has been prescribed as it may cause her more symptoms that she is concerned with.  She will be following up with them.  No other medication adjustments have been made.  She is still experiencing episodes of constipation and diarrhea.  She has not had a bowel movement in nearly 5 days.  Any fiber that she has used including powder form or Gummies or capsules is causing more significant bloating.  She has not noted any blood in her stools.  GI Review of Systems Positive as above including anorexia with decreased appetite Negative for dysphagia, odynophagia   Review of Systems General: Denies fevers/chills/unintentional weight loss Cardiovascular: Denies chest pain Pulmonary: Denies shortness of breath Gastroenterological: See HPI Genitourinary: Denies darkened urine Hematological: Denies easy bruising/bleeding Dermatological: Denies jaundice Psychological: Mood is stable   Medications Current Outpatient Medications  Medication Sig Dispense Refill   dronabinol (MARINOL) 2.5 MG capsule Take 1 capsule (2.5 mg total) by mouth in the morning, at noon, in the evening, and at bedtime. 120 capsule 5   FLUoxetine (PROZAC) 20 MG capsule Take 60 mg by mouth every  morning.     levonorgestrel (KYLEENA) 19.5 MG IUD 1 each by Intrauterine route once.     zonisamide (ZONEGRAN) 100 MG capsule Take 150 mg by mouth daily.     No current facility-administered medications for this visit.    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  Tobacco Use   Smoking status: Never   Smokeless tobacco: Never  Vaping Use   Vaping Use: Never used  Substance and Sexual Activity   Alcohol use: Never   Drug use: Never   Sexual activity: Yes    Birth control/protection: None  Other Topics Concern   Not on file  Social History Narrative   Right handed   Drinks caffeine   Two story home   Social Determinants of Health   Financial Resource Strain: Not on file  Food Insecurity: No Food Insecurity (03/28/2022)   Hunger Vital Sign    Worried About Running Out of Food in the Last Year: Never true    Ran Out of Food in the Last Year: Never true  Transportation Needs: Not on file  Physical Activity: Not on file  Stress: Not on file  Social Connections: Not on file  Intimate Partner Violence: Not on file   Family History  Problem Relation Age of Onset   Skin cancer Mother    Colon polyps Mother    Cancer Father    Colon cancer Father 79   Colon polyps Father    Rectal cancer Father    Hypertension Paternal Uncle    Cancer Maternal Grandmother    Stroke Maternal Grandfather    GER disease Other    Esophageal cancer Neg Hx    Inflammatory bowel disease Neg Hx    Liver disease Neg Hx    Pancreatic cancer Neg Hx    Stomach cancer Neg Hx    I have reviewed her medical, social, and family history in detail and updated the electronic medical record as necessary.    PHYSICAL EXAMINATION  BP 98/62   Pulse 79   Ht 5\' 9"  (1.753 m)   Wt 115 lb (52.2  kg)   SpO2 99%   BMI 16.98 kg/m  Wt Readings from Last 3 Encounters:  04/02/22 115 lb (52.2 kg)  04/03/22 112 lb 14.4 oz (51.2 kg)  02/06/22 112 lb (50.8 kg)  GEN: NAD, appears stated age, doesn't appear chronically ill PSYCH: Cooperative, without pressured speech EYE: Conjunctivae pink, sclerae anicteric ENT: MMM CV: Nontachycardic RESP: No audible wheezing GI: NABS, soft, no rebound  MSK/EXT: No lower extremity edema SKIN: No jaundice NEURO:  Alert & Oriented x 3, no focal deficits   REVIEW OF DATA  I reviewed the following data at the time of this encounter:  GI Procedures and Studies  Previously reviewed  Laboratory Studies  No relevant studies to review  Imaging Studies  No new imaging studies to review   ASSESSMENT  Ms. Brockmann is a 21 y.o. female with a pmh significant for family history colorectal cancer (father at age 42), anxiety/depression, concern for previous eating disorder, concussions, endometriosis (status post IUD), likely IBS-C/D, functional chronic abdominal pain, functional nausea.  The patient is seen today for evaluation and management of:  1. Irritable bowel syndrome with both constipation and diarrhea   2. Bloating   3. Chronic nausea   4. Generalized abdominal pain   5. Nausea without vomiting   6. Anorexia    Patient is hemodynamically stable.  Clinically she continues to experience her symptoms of chronic nausea as well as recurrent episodes of generalized abdominal pain.  Her weight thankfully has been stable and actually improved since her last few visits.  We had offered the patient a potential referral to one of the quaternary centers but she deferred on this for further evaluation currently.  With her persisting symptoms I do think that formal breath testing is now necessary.  I think she could get some benefit from TCA being added onto her fluoxetine but this has been held per prior psychiatry concerns.  The patient talking with her  psychiatrist about this.  Continue Marinol for now.  She will have her other antiemetics available as well.  We will remove the scopolamine patch because she is concerned about the potential reactions for this.  Bentyl has been trialed and Levsin has been trialed without effectiveness so will not be restarted.  We are going to try to get her bowels moving more frequently.  I think if she is not having as much constipation, that she will have less issues of diarrhea.  When she was on Linzess previously she felt that it was too powerful.  She will initiate Dulcolax suppositories to use on an every other day basis to try and have at least 3 bowel movements per week.  Trulance samples were given to the patient to initiate to try and see if we can help optimize her bowels as well and she will update [place] in case she wants to have a prescription for that.  Previous trials of IBgard and FDgard have not been helpful.  We will see where things are in the coming weeks.  All patient questions were answered to the best of my ability, and the patient agrees to the aforementioned plan of action with follow-up as indicated.   PLAN  Antiemetics as needed - Compazine suppositories as needed - Marinol 3-4 times daily as needed SIBO breath testing to be performed Dulcolax 5 to 10 mg every 2 to 3 days as needed May trial of Trulance suppositories daily if Dulcolax not helpful Still defer to our psychiatry colleagues but query the use of  SSRI + TCA combination versus BuSpar in future Appreciate 3 birds counseling Appreciate nutrition referral Should patient desire, a repeat referral to quaternary center can be offered Try Carnation instant milk with protein shakes to optimize protein and calories daily    No orders of the defined types were placed in this encounter.    New Prescriptions   No medications on file   Modified Medications   No medications on file    Planned Follow Up No follow-ups on  file.   Total Time in Face-to-Face and in Coordination of Care for patient including independent/personal interpretation/review of prior testing, medical history, examination, medication adjustment, communicating results with  the patient directly, and documentation with the EHR is 25 minutes.   Corliss Parish, MD [facility] Office # [phone]

## 2022-04-05 ENCOUNTER — Encounter: Payer: Self-pay | Admitting: Gastroenterology

## 2022-04-05 ENCOUNTER — Telehealth: Payer: Self-pay | Admitting: Gastroenterology

## 2022-04-05 DIAGNOSIS — R110 Nausea: Secondary | ICD-10-CM | POA: Insufficient documentation

## 2022-04-05 NOTE — Telephone Encounter (Signed)
Left message on machine to call back  

## 2022-04-05 NOTE — Telephone Encounter (Signed)
Inbound call from [facility] requesting a call back in regards to patient. They can be reached up until 1 pm today at [phone] and you can speak to anyone that from their customer service line.  Thank you

## 2022-04-05 NOTE — Addendum Note (Signed)
Addended by: Corliss Parish on: 04/05/2022 05:19 AM   Modules accepted: Level of Service

## 2022-04-09 NOTE — Telephone Encounter (Signed)
The call has been returned to [facility] - they wanted to confirm the order.  I did confirm the pt should have SIBO for bacterial overgrowth.  No further questions at this time.Marland Kitchen

## 2022-04-15 NOTE — Progress Notes (Unsigned)
Virtual Visit via Video Note  I connected with Jaime Bauer on 04/16/22 at  1:00 PM EDT by a video enabled telemedicine application and verified that I am speaking with the correct person using two identifiers.   I discussed the limitations of evaluation and management by telemedicine and the availability of in person appointments. The patient expressed understanding and agreed to proceed.  Patient location: home Provider locations: office  Subjective:    CC: Trouble sleeping  HPI: Pleasant 21 year old female presenting today via MyChart video visit to discuss trouble sleeping.  Reports that she is only getting approximately 1 to 2 hours of sleep per night and this has been going on for about a month.  Over the summer she does admit to having bad sleeping habits and often not getting into bed until 5 AM.  Over the last month, she notes that she is not getting into bed until 7 AM and has to get up at 8 AM.  She does not have a bedtime routine and is not currently practicing any recommended sleep hygiene activities.  She reports that she does not go to bed or try to go to sleep until 7 AM because she knows that she is not going to have any success at it.  She has tried Remeron in the past but this left her exceedingly groggy.  She also tried melatonin but this was not helpful.  She is currently managed by psychiatry for anxiety depression and recently had an increase in her fluoxetine to 80 mg daily.  Reports that her symptoms are still not well controlled.  She does take Marinol 2.5 mg 3 times daily to manage nausea and notes that this medication does make her sleepy when she takes it in the morning in the afternoon however her nighttime dose around 10 PM is not helpful or sedating at all.  Has not tried over-the-counter or herbal options outside of that noted above.  Of note, she does report worsened issues with attention and concentration lately.  Past medical history, Surgical history, Family  history not pertinant except as noted below, Social history, Allergies, and medications have been entered into the medical record, reviewed, and corrections made.   Review of Systems: See HPI for pertinent positives and negatives.   Objective:    General: Speaking clearly in complete sentences without any shortness of breath.  Alert and oriented x3.  Normal judgment. No apparent acute distress.  Impression and Recommendations:    1. Trouble in sleeping Reviewed recommendations for sleep hygiene practices.  Would like for her to start a bedtime routine and make sure to go to bed at night at a decent time.  Advised that these actions will work to trick her mind into falling asleep easier.  Reviewed over-the-counter and herbal options and advised her to contact her pharmacist to evaluate for any potential drug interactions prior to starting any of these.  Reviewed prescription options.  Although I would recommend starting with trazodone, she is on a high dose of fluoxetine so we will avoid this due to risk of serotonin syndrome.  Starting hydroxyzine 25-50 mg nightly at bedtime.  2. Inattention Strongly suspect this is multifactorial and related to poor sleep, poorly controlled anxiety/depression, and medication side effects.  Advised that we can look further into this if needed once she is at a better place with her mental health and is getting better sleep.  I discussed the assessment and treatment plan with the patient. The patient was provided an  opportunity to ask questions and all were answered. The patient agreed with the plan and demonstrated an understanding of the instructions.   The patient was advised to call back or seek an in-person evaluation if the symptoms worsen or if the condition fails to improve as anticipated.  25 minutes of non-face-to-face time was provided during this encounter.  Return in about 4 weeks (around 05/14/2022) for Insomnia/inattention follow-up.  Thayer Ohm, DNP, APRN, FNP-BC [facility]

## 2022-04-16 ENCOUNTER — Telehealth (INDEPENDENT_AMBULATORY_CARE_PROVIDER_SITE_OTHER): Payer: No Typology Code available for payment source | Admitting: Medical-Surgical

## 2022-04-16 DIAGNOSIS — R41840 Attention and concentration deficit: Secondary | ICD-10-CM

## 2022-04-16 DIAGNOSIS — G479 Sleep disorder, unspecified: Secondary | ICD-10-CM

## 2022-04-18 ENCOUNTER — Encounter: Payer: No Typology Code available for payment source | Attending: Gastroenterology | Admitting: Registered"

## 2022-04-18 DIAGNOSIS — R634 Abnormal weight loss: Secondary | ICD-10-CM | POA: Diagnosis present

## 2022-04-18 NOTE — Patient Instructions (Addendum)
Instructions/Goals Continued:   Eat within 1 hour of waking and every 2-3 hours afterward.   Meal Goals: Starch half plate, protein 1/4 plate, fruit/vegetable.   Snack: 2 food items: bagel + cheese OR peanut butter OR cream cheese  Dairy 3 times per day.   Try out Ensure Clear apple flavor. I will see if samples can be mailed to you. If you like it call or email dietitian and we can do an order to supply it or may purchase from [employer]. Recommend starting with 2 daily as part of your beverages.   Supplemental:  Centrum Women's Tablet OR Nature Made Multiviatmin Tablet OR Flintstones Complete: Continue. Calcium supplement: may do Tums 2 times daily or other 500 mg calcium supplement 2 times daily if unable to do dairy 3 times daily

## 2022-04-18 NOTE — Progress Notes (Unsigned)
Medical Nutrition Therapy  Appointment Start time:  [phone]  Appointment End time:  1440.  Primary concerns today: Nausea, low appetite.  Referral diagnosis: Chronic Nausea; Unintentional Weight Loss  Preferred learning style: no preference indicated Learning readiness: Contemplating/Ready   NUTRITION ASSESSMENT   Anthropometrics  Weight: 112 lb 14 oz Height: 5'9"   Clinical Medical Hx: IBS (C&D), anorexia, anxiety, depression, OCD, ARFID Medications: See list. Reviewed.  Labs: N/A Notable Signs/Symptoms: ongoing nausea, diarrhea/constipation, chronic tiredness, dizziness, migraines (reports has chronic migraines).   Lifestyle & Dietary Hx  Pt reports she added the multivitamin discussed. Has not added the calcium.   Pt reports she has as been prescribed hydroxyzine to help with sleep.   Pt reports her stomach has been feeling worse than at last visit. Pt reports having constant stomach pain (internal achy feeling, described as organs/intestines hurting). Reports overall constant, reports no real relief and started a few weeks ago. Reports having more constipation lately, reports when she finally has a bowel movement she will have diarrhea for a full day which occurs 1-2 times per week. Reports nausea has been very bad and causing some gagging but denies any vomiting.   Reports dizziness has remained the same. Denies any fainting. Sometimes feels room is spinning when laying in bed. Reports energy level has not been good at all.   Pt reports she has been trying to eat more consistently as discussed at last visit, but has been hard because she gets full quickly and stays full. Reports the [employer] drinks made her gag due to not liking the taste. In past liked the [employer] best. Report she is open to trying Ensure Clear. Reports she was told about it in the past but  reports she and her parents were unable to find it in stores. Discussed having samples sent to pt and  showed where to purchase online [employer]). Pt completed ROI to receive samples from [employer] (Ensure manufacturer.   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   Weekends: Reports eating a little different on weekends: Reports she eats less due to forgetting with not having classes to attend at certain times. [employer] often orders same breakfast foods and then does dinner later. Pt lives off campus, with 1 roommate.   Psychiatrist: Dr. Rana Snare ([facility]), every 8 weeks. Reports she will be meeting with him tomorrow.    Pets: Pt has a cat (siamese mix) and dog (beagle/retriever mix).   Estimated daily fluid intake: 1 large Life water 20 oz, venti refresher from [employer]  Supplements: (NEW): Women's Centrum MVI tablet  Sleep: N/A Stress / self-care: N/A Current average weekly physical activity: None reported.   24-Hr Dietary Recall Yesterday:  945 AM: mini bagel with cream cheese (finished), no beverage  230 PM: croissant (finished all), venti strawberry refresher [employer] (drank a little over half)  530-6 PM: Clean Juice: avocado egg salad sandwich on sliced whole grain bread, a little less than half small smoothie (strawberry banana and spinach) 12 AM: most of ~half cup Rice Krispies with ~half cup whole milk (not all milk was consumed)    Estimated Energy Needs (Estimated Using IBW): Calories: 2323 Carbohydrate: 261-377g Protein: 53g Fat: 52-90g  NUTRITION DIAGNOSIS  NI-1.4 Inadequate energy intake As related to low appetite, chronic nausea/bloating.  As evidenced by reported dietary recall, BMI of 16.67.   NUTRITION INTERVENTION  Nutrition education (E-1) on the following topics:  Praised pt for efforts to eat more often throughout the day Discussed how going  through period of undernutrition/low intake leads to the stomach shrinking and the body no longer being able to signal hunger to meet needs. Discussed reteaching the body these signals by pushing self to eat consistently and larger amounts than the body may feel it wants.  Pt will be meeting with Mirian Capuchin, MS, RDN, LDN, CDCES on 10/03  Handouts Provided Include  None today.   Learning Style & Readiness for Change Teaching method utilized: Visual & Auditory  Demonstrated degree of understanding via: Teach Back  Barriers to learning/adherence to lifestyle change: Ongoing nausea and stomach pain.   Goals Established by Pt  Instructions/Goals Continued:   Eat within 1 hour of waking and every 2-3 hours afterward.   Meal Goals: Starch half plate, protein 1/4 plate, fruit/vegetable.   Snack: 2 food items: bagel + cheese OR peanut butter OR cream cheese  Dairy 3 times per day.   Try out Ensure Clear apple flavor. I will see if samples can be mailed to you. If you like it call or email dietitian and we can do an order to supply it or may purchase from [employer]. Recommend starting with 2 daily as part of your beverages.   Supplemental:  Centrum Women's Tablet OR Nature Made Multiviatmin Tablet OR Flintstones Complete: Continue. Calcium supplement: may do Tums 2 times daily or other 500 mg calcium supplement 2 times daily if unable to do dairy 3 times daily   MONITORING & EVALUATION Dietary intake, weekly physical activity, and weight in 3 weeks with Mirian Capuchin, MS, RDN, LDN, CDCES.  Next Steps  Patient is to call/email if any questions or concerns in between appointments. Request pt contact dietitian regarding supplement samples.

## 2022-04-22 ENCOUNTER — Encounter: Payer: Self-pay | Admitting: Gastroenterology

## 2022-04-24 ENCOUNTER — Encounter: Payer: Self-pay | Admitting: Registered"

## 2022-05-07 ENCOUNTER — Encounter: Payer: Self-pay | Admitting: Registered"

## 2022-05-07 ENCOUNTER — Encounter: Payer: No Typology Code available for payment source | Attending: Medical-Surgical | Admitting: Registered"

## 2022-05-07 DIAGNOSIS — Z713 Dietary counseling and surveillance: Secondary | ICD-10-CM | POA: Diagnosis not present

## 2022-05-07 DIAGNOSIS — R634 Abnormal weight loss: Secondary | ICD-10-CM | POA: Diagnosis not present

## 2022-05-07 DIAGNOSIS — R110 Nausea: Secondary | ICD-10-CM | POA: Diagnosis present

## 2022-05-07 NOTE — Patient Instructions (Addendum)
-   Aim to increase fluid intake to at least 64 oz a day.   - Get reconnected with therapist. Contact Creed Copper Young [phone]) or Dessie Coma [phone]).  - Implement [employer] Breakfast 3 times/day:  Before noon In the afternoon with snack Before bed   - Reach out to [employer], PA-C at [phone] for medical follow-up with eating challenges.

## 2022-05-07 NOTE — Progress Notes (Signed)
Appointment start time: 8:20  Appointment end time: 9:13  Patient was seen on 05/07/2022 for nutrition counseling pertaining to disordered eating  Primary care provider: Samuel Bouche, NP Therapist: none  ROI: N/A Any other medical team members: Floreen Comber, MD ([facility] psychiatrist) Parents: none   Assessment  Pt arrives stating she has a lot of stomach issues that doctor's have not been able to figure out. Reports a lot of nausea and being unable to eat. States this started for her 4 years ago. States she was vomiting then but has become better now. States now she doesn't have an appetite. Reports stomach hurts often and feels bloated. States she alternates between diarrhea and constipation.  States she was admitted into the hospital due to being underweight and malnourished. States she had a feeding tube and was there a few days. States mom set her up with therapy at [facility] and pt stopped attending sessions due to therapist missing one appointment.   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   States in the last 4 years, she has not exceeded 120 lbs. States she has been extremely bloated for about a week and still feels nauseous. Not feeling great. States she hasn't had a bowel movement in about 5 days. States she has a laxative to try to help.    Growth Metrics: not provided Goal rate of weight gain:  0.5-1.0 lb/week  Eating history: Length of time: 4 years ago Previous treatments: yes; states she tried a bunch of nutrition shakes but stomach did not agree Goals for RD meetings: improve constipation/diarrhea, dizziness/lightheadedness, headaches/body aches, heart racing/chest pain, focus/concentration, cold intolerance  Weight history:  Highest weight: 135-140  Lowest weight: 108 Most consistent weight: 115  What would you like to weigh: no, wants to feel like she has energy again How has weight  changed in the past year: has stayed consistently low  Medical Information:  Changes in hair, skin, nails since ED started: none Chewing/swallowing difficulties: none Reflux or heartburn: yes, not taking medication for it States she was taking something for it but stopped being prescribed for it.  Trouble with teeth: pain when she eats sweeter items LMP without the use of hormones: N/A  Weight at that point: N/A Effect of exercise on menses: N/A   Effect of hormones on menses: has IUD; with the intent to help with endometriosis and cramps; bleeds sometimes Constipation, diarrhea: yes, both, most recently constipation and will alternate  Dizziness/lightheadedness: yes, multiple times/day Headaches/body aches: yes Heart racing/chest pain: yes Mood: not great Sleep: not great; was sleeping 1-2 hrs/night and now sleeping 4-6 hrs/night in the past few days Focus/concentration: a lot of challenges Cold intolerance: yes Vision changes: sometimes her eyes don't focus  Mental health diagnosis: ARFID   Dietary assessment: A typical day consists of 1 meals and 3 snacks  Safe foods include: bread, croissants, bagels, crispy rice cereal, eggs (occasionally)  Avoided foods include: most vegetables (prefers to have them in a smoothie)  24 hour recall:  B: sleep until 8 am S: L (12 pm): [employer] - 3/4 grilled cheese + 1/2 strawberry refresher with water  S (3 pm): croissant S (7 pm): handful of white cheddar dorito's chips  D (10 pm): crispy rice + whole milk  S:  Beverages: refresher (12 oz), whole milk (8 oz), water (16 oz); 36 oz  Physical activity: walks dog 15-20 min/day, 7 days/week   What  Methods Do You Use To Control Your Weight (Compensatory behaviors)?  none  Estimated energy intake: 1200-1300 kcal  Estimated energy needs: 2000-2200 kcal 250-275 g CHO 100-110 g pro 67-73 g fat  Nutrition Diagnosis: NI-1.4 Inadequate energy intake As related to disordered eating.  As  evidenced by irrational beliefs about the effects of food on the body.  Intervention/Goals: Pt was educated and counseled on eating to nourish the body, signs/symptoms of not being adequately nourished, ways to increase nourishment, and meal planning. Discussed prevalence of lactose intolerance signs/symptoms when restriction has taken place over a period of time. Discussed potentially feeling bloated, gastroparesis, abdominal distention, and feelings of fullness when increasing intake. Discussed importance of including medical and mental health providers on her team. Pt agreed with goals listed. Goals: - Aim to increase fluid intake to at least 64 oz a day.  - Get reconnected with therapist. Contact Monica Becton Young [phone]) or Lysle Rubens [phone]). - Implement [employer] Breakfast 3 times/day:  Before noon In the afternoon with snack Before bed - Reach out to [employer], PA-C at [phone] for medical follow-up with eating challenges.   Meal plan:    3 meals    2-3 snacks  Monitoring and Evaluation: Patient will follow up in 2 weeks.

## 2022-05-07 NOTE — Telephone Encounter (Signed)
Correction -  Conference room reserved for 05/15/22 12:15pm-1:30pm for Dr. Rush Landmark to meet with [facility].

## 2022-05-21 ENCOUNTER — Encounter: Payer: Self-pay | Admitting: Registered"

## 2022-05-21 ENCOUNTER — Encounter: Payer: No Typology Code available for payment source | Attending: Gastroenterology | Admitting: Registered"

## 2022-05-21 DIAGNOSIS — R634 Abnormal weight loss: Secondary | ICD-10-CM | POA: Diagnosis not present

## 2022-05-21 DIAGNOSIS — R110 Nausea: Secondary | ICD-10-CM | POA: Insufficient documentation

## 2022-05-21 DIAGNOSIS — Z713 Dietary counseling and surveillance: Secondary | ICD-10-CM

## 2022-05-21 NOTE — Patient Instructions (Addendum)
-   Get reconnected with therapist. Contact Creed Copper Young [phone]) or Dessie Coma [phone]).  - Implement [employer] Breakfast 3 times/day:  Before noon In the afternoon with snack Before bed  - Use lactose-free whole milk with Carnation Instant breakfast.   - Aim to increase fluid intake to at least 64 oz a day.

## 2022-05-21 NOTE — Progress Notes (Signed)
Appointment start time: 2:10  Appointment end time: 3:05  Patient was seen on 05/21/2022 for nutrition counseling pertaining to disordered eating  Primary care provider: Christen Butter, NP Therapist: none  ROI: N/A Any other medical team members: Norlene Duel, MD ([facility] psychiatrist) Parents: none   Assessment  Pt arrives states she went to [place] with friends during fall break. States they walked around downtown and she ate out with friends. States food was good.  States she had dumplings, croissant from bakery, and pasta (with olive oil and shrimp) while there.   States she reached out to [place], PA-C and has upcoming appointment on 11/14 and also on waiting list in case of cancellation. States she didn't reach out to therapist; forgot as all of the attention was focused on getting appt with Chelle.   States her nausea has been a lot worse lately and unsure why even with nausea medication. States she sees her GI provider soon, 10/31. States nausea has increased since previous visit and having to walk around with vomit bag. States she has started to gag which hasn't happened in a really long time.   States she had a burrito last night, didn't eat it all, and gave the remainder of her burrito to roommate because she doesn't like reheating food. States she was vomiting a lot from buttered toast she was making at home which has created a challenge for her to have butter at home. States there is an association that is happening from that experience.    States when she was in high school there were challenges with food. States she was eating more then because mom was cooking meals. States she was a picky eater and limited in eating options.    Growth Metrics: not provided Goal rate of weight gain:  0.5-1.0 lb/week  Eating history: Length of time: 4 years ago Previous treatments: yes; states she tried a bunch of nutrition shakes but stomach did not agree Goals for RD meetings: improve  constipation/diarrhea, dizziness/lightheadedness, headaches/body aches, heart racing/chest pain, focus/concentration, cold intolerance  Weight history:  Highest weight: 135-140  Lowest weight: 108 Most consistent weight: 115  What would you like to weigh: no, wants to feel like she has energy again How has weight changed in the past year: has stayed consistently low  Medical Information:  Changes in hair, skin, nails since ED started: none Chewing/swallowing difficulties: none Reflux or heartburn: yes, not taking medication for it States she was taking something for it but stopped being prescribed for it.  Trouble with teeth: pain when she eats sweeter items LMP without the use of hormones: N/A  Weight at that point: N/A Effect of exercise on menses: N/A   Effect of hormones on menses: has IUD; with the intent to help with endometriosis and cramps; bleeds sometimes Constipation, diarrhea: yes, both, most recently constipation and will alternate  Dizziness/lightheadedness: yes, multiple times/day Headaches/body aches: yes Heart racing/chest pain: yes Mood: not great Sleep: not great; was sleeping 1-2 hrs/night and now sleeping 4-6 hrs/night in the past few days Focus/concentration: a lot of challenges Cold intolerance: yes Vision changes: sometimes her eyes don't focus  Mental health diagnosis: ARFID   Dietary assessment: A typical day consists of 1 meals and 3 snacks  Safe foods include: bread, croissants, bagels, crispy rice cereal, eggs (occasionally)  Avoided foods include: most vegetables (prefers to have them in a smoothie)  24 hour recall:  B: sleep until 8 am S (9:45 am): less than a handful  of white cheddar cheeto's  L (2 pm): [employer] - 3/4 breakfast sandwich ([place] bacon, egg white, cheese) + 3/4 strawberry refresher with water  S (3:30 pm): croissant; ate most of it  S (7 pm):  D (9 pm): [employer]-3/4 burrito-carnitas, white rice, cheese, sour cream)  S (11 pm):  handful of white cheddar cheeto's   Beverages: refresher (16 oz), water (30 oz); 46 oz  Physical activity: walks dog 15-20 min/day, 7 days/week   What Methods Do You Use To Control Your Weight (Compensatory behaviors)?  none  Estimated energy intake: 1700-1800 kcal  Estimated energy needs: 2000-2200 kcal 250-275 g CHO 100-110 g pro 67-73 g fat  Nutrition Diagnosis: NI-1.4 Inadequate energy intake As related to disordered eating.  As evidenced by irrational beliefs about the effects of food on the body.  Intervention/Goals: Pt was educated and counseled on eating to nourish the body, signs/symptoms of not being adequately nourished, ways to increase nourishment, and meal planning. Discussed prevalence of lactose intolerance signs/symptoms when restriction has taken place over a period of time. Discussed potentially feeling bloated, gastroparesis, abdominal distention, and feelings of fullness when increasing intake. Discussed importance of including mental health provider on her team. Pt agreed with goals listed. Goals: - Get reconnected with therapist. Contact Creed Copper Young [phone]) or Dessie Coma [phone]). - Implement [employer] Breakfast 3 times/day:  Before noon In the afternoon with snack Before bed - Use lactose-free whole milk with Carnation Instant breakfast.  - Aim to increase fluid intake to at least 64 oz a day.   Meal plan:    3 meals    2-3 snacks  Monitoring and Evaluation: Patient will follow up in 2 weeks.

## 2022-06-02 IMAGING — CT CT CHEST W/ CM
2 of 4 series · 15 of 36 positions shown, 18 images · IV contrast (agent unspecified)
Comparison: None.

CLINICAL DATA: Weight loss

EXAM:
CT CHEST WITH CONTRAST
TECHNIQUE: Multidetector CT imaging of the chest was performed during
intravenous contrast administration.

[Series 2: axial st · axial · 0.59mm/px · z∈[+1350,+1614]mm · 12 of 156 slices shown, 15 images]
[im 12/156  mediastinal]
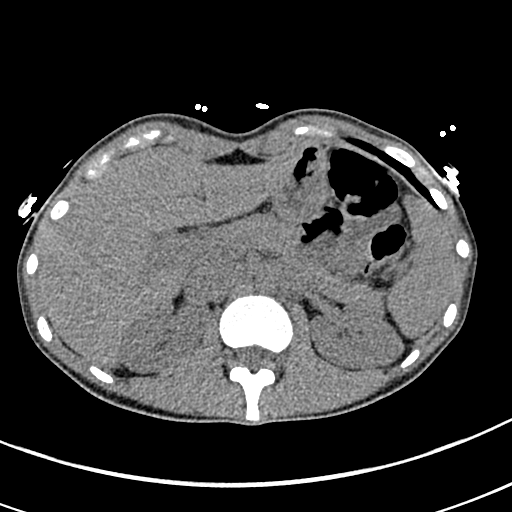
[im 12/156  lung]
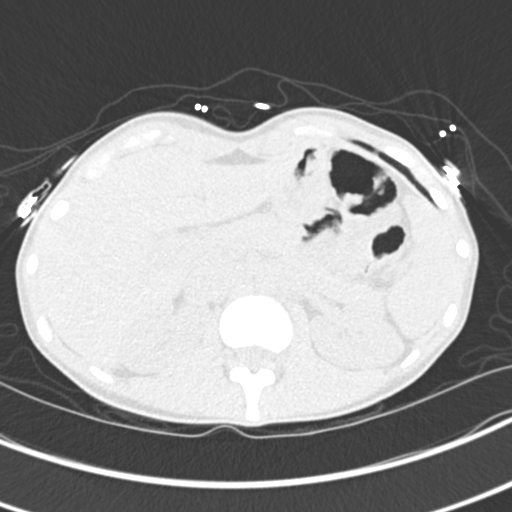
[im 23/156  lung]
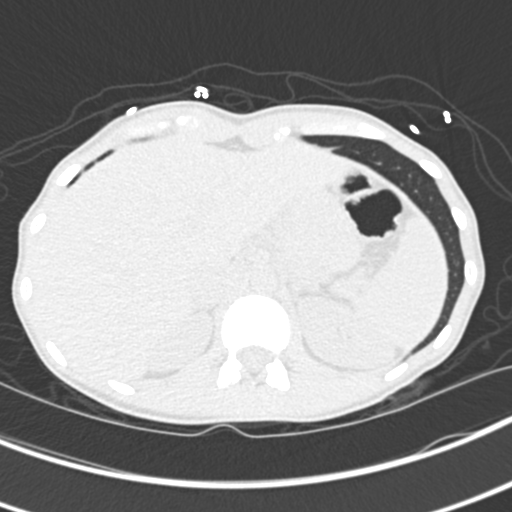
[im 34/156  lung]
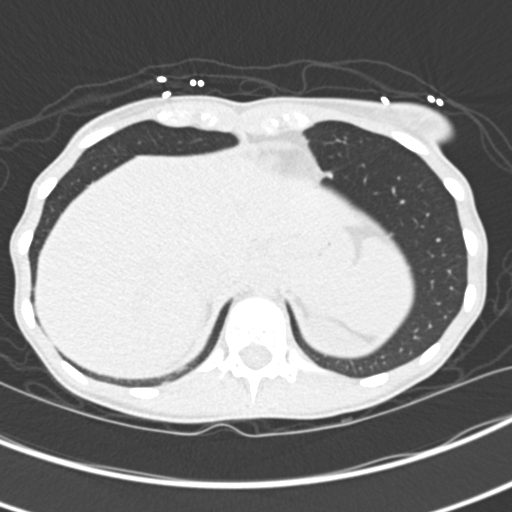
[im 45/156  lung]
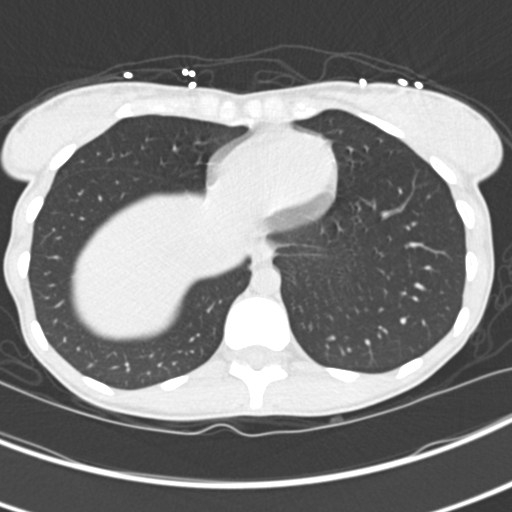
[im 56/156  mediastinal]
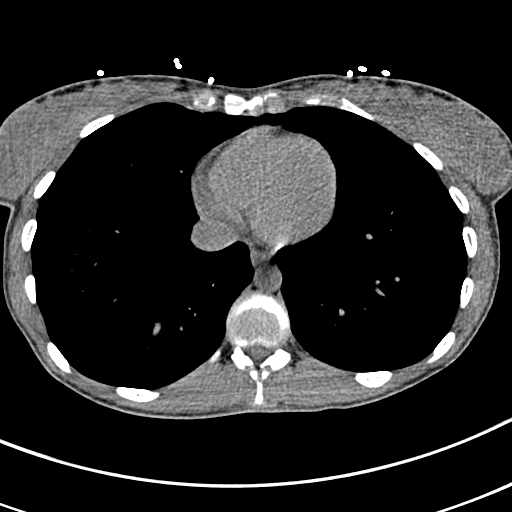
[im 56/156  lung]
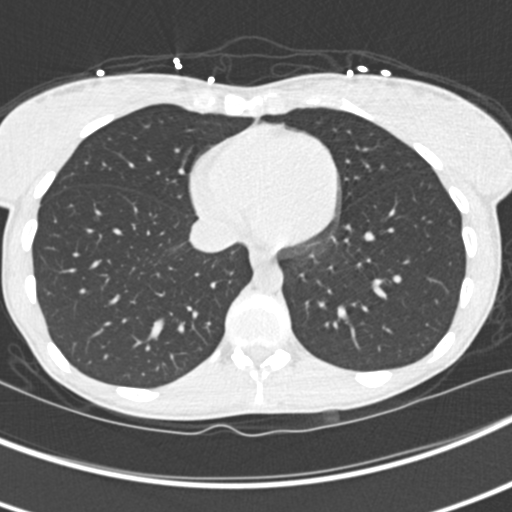
[im 67/156  lung]
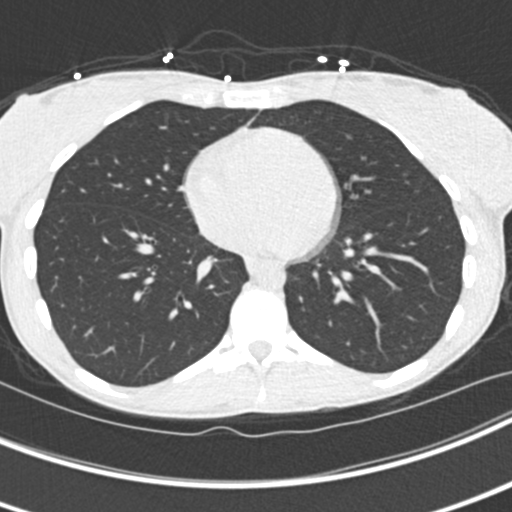
[im 89/156  lung]
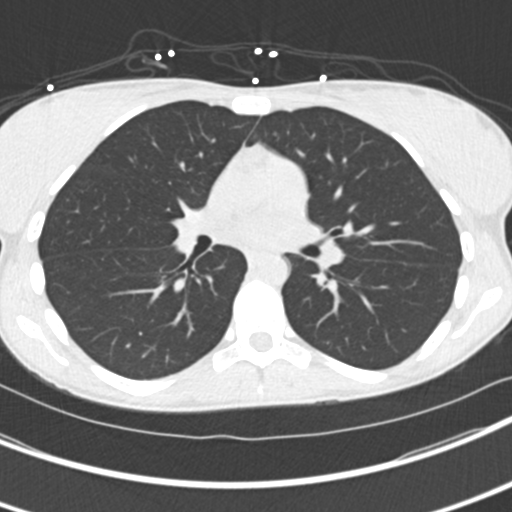
[im 100/156  lung]
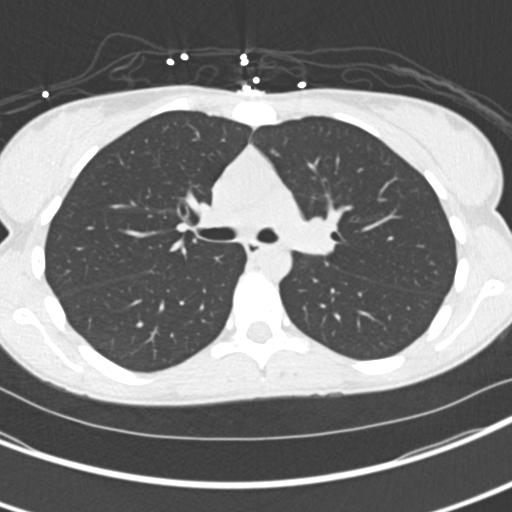
[im 111/156  mediastinal]
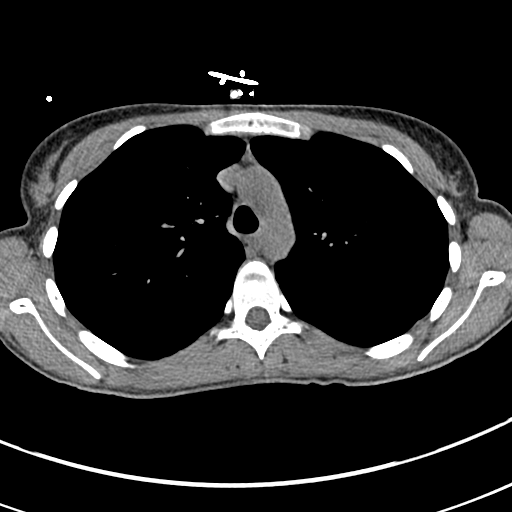
[im 111/156  lung]
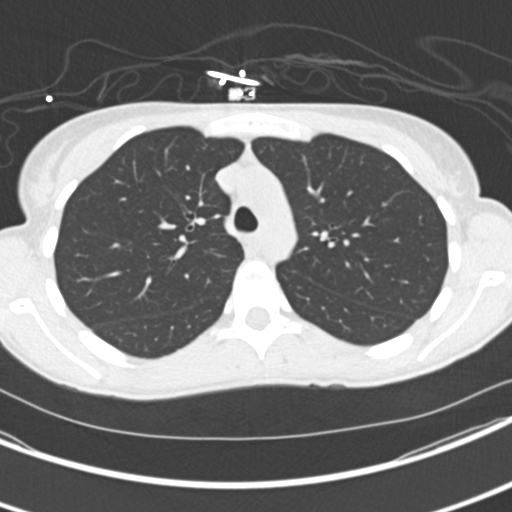
[im 122/156  lung]
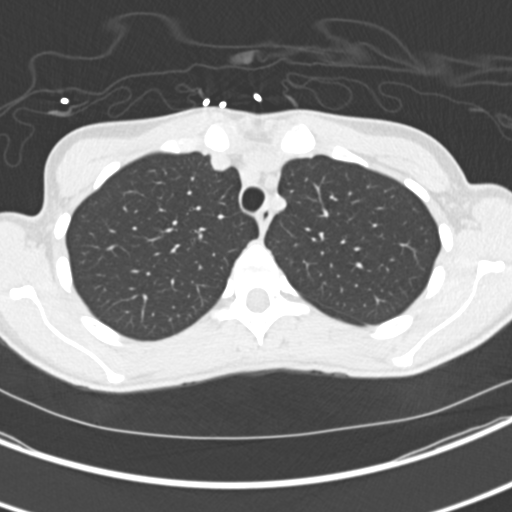
[im 133/156  lung]
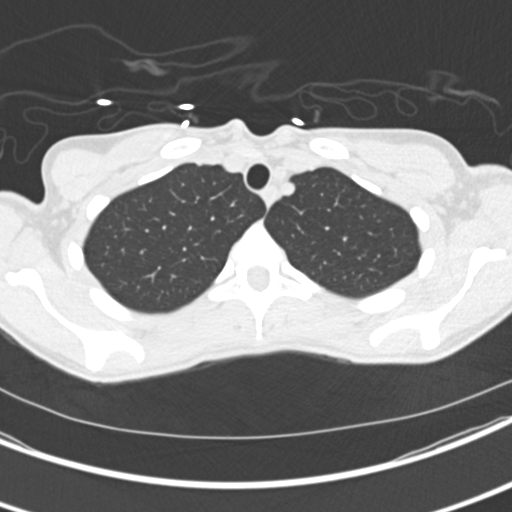
[im 144/156  lung]
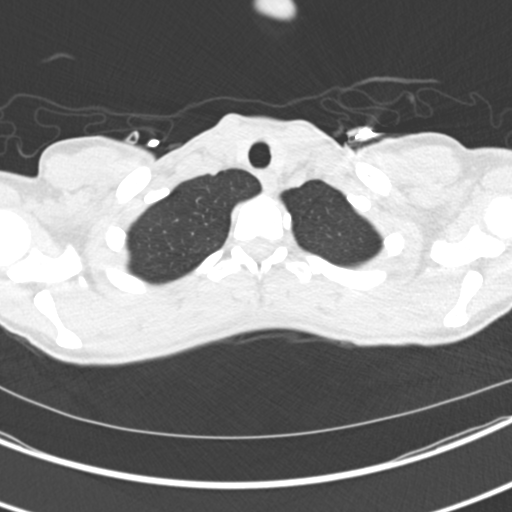

[Series 5: coronal · coronal · 0.56mm/px · 3 of 99 slices shown]
[im 20/99  lung]
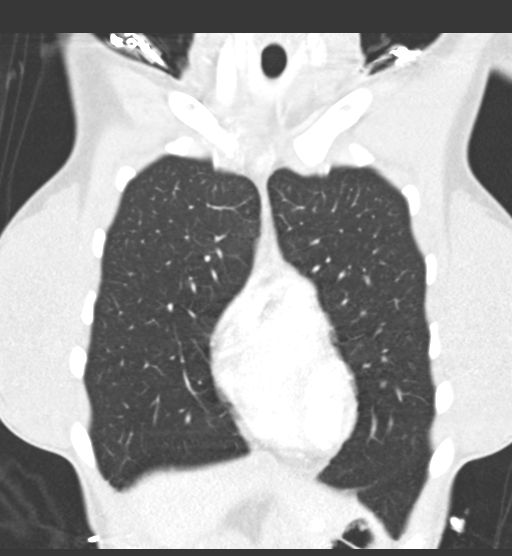
[im 40/99  lung]
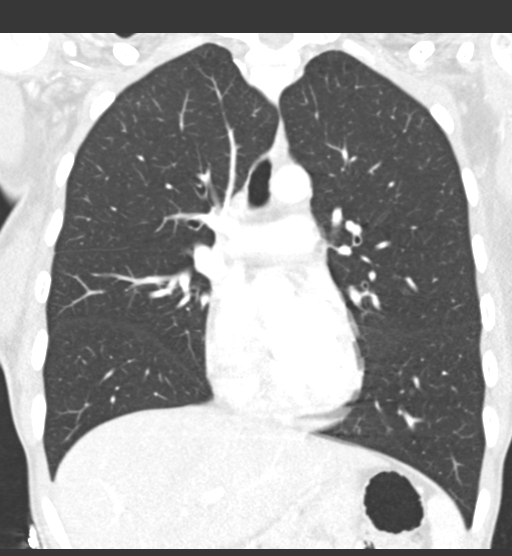
[im 59/99  lung]
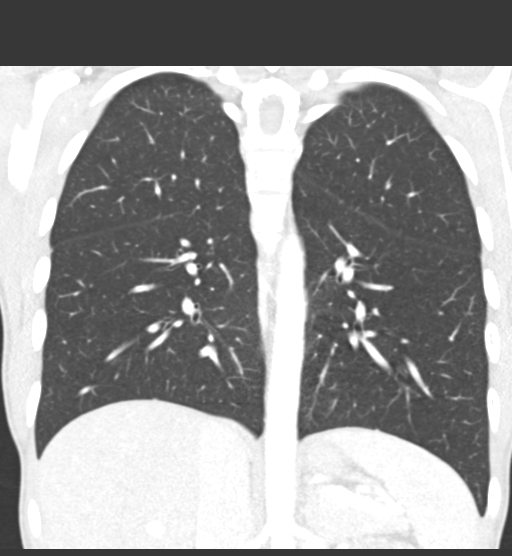

[15 of 36 positions shown; findings below may reference images not displayed]

RADIATION DOSE REDUCTION: This exam was performed according to the
departmental dose-optimization program which includes automated
exposure control, adjustment of the mA and/or kV according to
patient size and/or use of iterative reconstruction technique.

CONTRAST:  75mL OMNIPAQUE IOHEXOL 300 MG/ML  SOLN
FINDINGS: Cardiovascular: No significant vascular findings. Normal heart size.
No pericardial effusion.

Mediastinum/Nodes: No enlarged mediastinal, hilar, or axillary lymph
nodes. Thyroid gland, trachea, and esophagus demonstrate no
significant findings.

Lungs/Pleura: Lungs are clear. No pleural effusion or pneumothorax.

Upper Abdomen: No acute abnormality.

Musculoskeletal: No chest wall abnormality. No acute or significant
osseous findings.
IMPRESSION: No acute process or suspicious mass identified in the chest.

## 2022-06-04 ENCOUNTER — Other Ambulatory Visit (INDEPENDENT_AMBULATORY_CARE_PROVIDER_SITE_OTHER): Payer: No Typology Code available for payment source

## 2022-06-04 ENCOUNTER — Encounter: Payer: Self-pay | Admitting: Gastroenterology

## 2022-06-04 ENCOUNTER — Encounter: Payer: No Typology Code available for payment source | Attending: Gastroenterology | Admitting: Registered"

## 2022-06-04 ENCOUNTER — Ambulatory Visit (INDEPENDENT_AMBULATORY_CARE_PROVIDER_SITE_OTHER): Payer: No Typology Code available for payment source | Admitting: Gastroenterology

## 2022-06-04 ENCOUNTER — Encounter: Payer: Self-pay | Admitting: Registered"

## 2022-06-04 VITALS — BP 98/62 | HR 70 | Ht 69.0 in | Wt 119.0 lb

## 2022-06-04 DIAGNOSIS — Z713 Dietary counseling and surveillance: Secondary | ICD-10-CM | POA: Diagnosis present

## 2022-06-04 DIAGNOSIS — R42 Dizziness and giddiness: Secondary | ICD-10-CM

## 2022-06-04 DIAGNOSIS — R4189 Other symptoms and signs involving cognitive functions and awareness: Secondary | ICD-10-CM

## 2022-06-04 DIAGNOSIS — R110 Nausea: Secondary | ICD-10-CM

## 2022-06-04 DIAGNOSIS — R634 Abnormal weight loss: Secondary | ICD-10-CM | POA: Diagnosis not present

## 2022-06-04 DIAGNOSIS — R1084 Generalized abdominal pain: Secondary | ICD-10-CM | POA: Diagnosis not present

## 2022-06-04 DIAGNOSIS — F509 Eating disorder, unspecified: Secondary | ICD-10-CM

## 2022-06-04 LAB — COMPREHENSIVE METABOLIC PANEL
ALT: 11 U/L (ref 0–35)
AST: 11 U/L (ref 0–37)
Albumin: 4.6 g/dL (ref 3.5–5.2)
Alkaline Phosphatase: 52 U/L (ref 39–117)
BUN: 15 mg/dL (ref 6–23)
CO2: 24 mEq/L (ref 19–32)
Calcium: 9.4 mg/dL (ref 8.4–10.5)
Chloride: 107 mEq/L (ref 96–112)
Creatinine, Ser: 0.77 mg/dL (ref 0.40–1.20)
GFR: 109.74 mL/min (ref >60.00)
Glucose, Bld: 96 mg/dL (ref 70–99)
Potassium: 3.7 mEq/L (ref 3.5–5.1)
Sodium: 139 mEq/L (ref 135–145)
Total Bilirubin: 0.4 mg/dL (ref 0.2–1.2)
Total Protein: 7.3 g/dL (ref 6.0–8.3)

## 2022-06-04 LAB — TSH: TSH: 1.81 u[IU]/mL (ref 0.35–5.50)

## 2022-06-04 LAB — AMYLASE: Amylase: 37 U/L (ref 27–131)

## 2022-06-04 LAB — CBC
HCT: 37.2 % (ref 36.0–46.0)
Hemoglobin: 12.7 g/dL (ref 12.0–15.0)
MCHC: 34 g/dL (ref 30.0–36.0)
MCV: 89.3 fl (ref 78.0–100.0)
Platelets: 315 10*3/uL (ref 150.0–400.0)
RBC: 4.17 Mil/uL (ref 3.87–5.11)
RDW: 12.3 % (ref 11.5–15.5)
WBC: 5.6 10*3/uL (ref 4.0–10.5)

## 2022-06-04 LAB — LIPASE: Lipase: 19 U/L (ref 11.0–59.0)

## 2022-06-04 MED ORDER — AMITRIPTYLINE HCL 50 MG PO TABS: 50.0000 mg | ORAL_TABLET | Freq: Every day | ORAL | 1 refills | Status: DC

## 2022-06-04 MED ORDER — DRONABINOL 2.5 MG PO CAPS: 2.5000 mg | ORAL_CAPSULE | Freq: Three times a day (TID) | ORAL | 5 refills | Status: DC

## 2022-06-04 MED ORDER — PROCHLORPERAZINE MALEATE 5 MG PO TABS: 5.0000 mg | ORAL_TABLET | Freq: Three times a day (TID) | ORAL | 1 refills | Status: DC | PRN

## 2022-06-04 NOTE — Patient Instructions (Addendum)
Your provider has requested that you go to the basement level for lab work before leaving today. Press "B" on the elevator. The lab is located at the first door on the left as you exit the elevator.  Decrease your Marinol to 3 times daily.   Start Elavil 50 mg once at bedtime.   Compazine 5 mg every 8 hours as needed for nausea.   We have sent the following medications to your pharmacy for you to pick up at your convenience: Marinol, Elavil, Compazine   If you are age 3 or older, your body mass index should be between 23-30. Your Body mass index is 17.57 kg/m. If this is out of the aforementioned range listed, please consider follow up with your Primary Care Provider.  If you are age 7 or younger, your body mass index should be between 19-25. Your Body mass index is 17.57 kg/m. If this is out of the aformentioned range listed, please consider follow up with your Primary Care Provider.   ________________________________________________________  The [facility] providers would like to encourage you to use [facility] to communicate with providers for non-urgent requests or questions.  Due to long hold times on the telephone, sending your provider a message by [facility] may be a faster and more efficient way to get a response.  Please allow 48 business hours for a response.  Please remember that this is for non-urgent requests.  _______________________________________________________  Thank you for choosing me and [facility].  Dr. Rush Landmark

## 2022-06-04 NOTE — Patient Instructions (Addendum)
-   Use lactose-free whole milk with Carnation Instant breakfast.   - Aim to drink 1.5 bottles of [place] a day in addition to refresher.    - Aim to have try parfait as a snack option during 1 hr break between classes.

## 2022-06-04 NOTE — Progress Notes (Signed)
GASTROENTEROLOGY OUTPATIENT CLINIC VISIT   Primary Care Provider Samuel Bouche, NP [place] [address] [phone]  Patient Profile: Jaime Bauer is a 21 y.o. 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  The patient presents to the [facility] for an evaluation and management of problem(s) noted below:  Problem List 1. Chronic nausea   2. Generalized abdominal pain   3. Brain fog   4. Unintentional weight loss   5. Dizziness   6. Eating disorder, unspecified type     History of Present Illness Please see prior notes for full details of HPI.  Interval History The patient returns for follow-up today.  I last saw the patient a few weeks ago when she was interviewing me for her on her stasis in regards to colon cancer screening.  Unfortunately, it looks like the patient has had significant decline as she describes a new issue of brain fogginess.  Depressed mood.  And she continues to express weight loss issues.  She is eating minimal amounts due to her chronic nausea.  She is dealing with more constipation currently rather than diarrhea.  Her last bowel movement was before the weekend.  Unfortunately things have had a downturn in her studies as well and she is potentially going to need incompletes so that her GPA does not progressively worsen.  She is at a loss.  She feels very frustrated and sad.  She continues to use Marinol but has increased that up to 4 times daily.  Patient states she ate less than 1000 cal total yesterday and just has no desire for eating because of her nausea.  She has been seeing nutrition and trying to increase her intake but has not been successful.  She is interested in thinking about  feeding tube again even though she did not tolerate the previous feeding tube in her nose for more than a few hours.  She is going to be seeing her psychiatrist on Monday.  GI Review of Systems Positive as above including progressive anorexia Negative for dysphagia, odynophagia, melena, hematochezia  Review of Systems General: Reports continued weight loss (though her weight has been stable to increased from her prior visits); denies fevers/chills Cardiovascular: Denies chest pain Pulmonary: Denies shortness of breath Gastroenterological: See HPI Genitourinary: Denies darkened urine Hematological: Denies easy bruising/bleeding Dermatological: Denies jaundice Psychological: Mood is distraught, she denies suicidal ideation or homicidal ideation   Medications Current Outpatient Medications  Medication Sig Dispense Refill   amitriptyline (ELAVIL) 50 MG tablet Take 1 tablet (50 mg total) by mouth at bedtime. 30 tablet 1   FLUoxetine (PROZAC) 20 MG capsule Take 60 mg by mouth every morning.     levonorgestrel (KYLEENA) 19.5 MG IUD 1 each by Intrauterine route once.     prochlorperazine (COMPAZINE) 5 MG tablet Take 1 tablet (5 mg total) by mouth every 8 (eight) hours as needed for nausea or vomiting. 30 tablet 1   zonisamide (ZONEGRAN) 100 MG capsule Take 150 mg by mouth daily.     dronabinol (MARINOL) 2.5 MG capsule Take 1 capsule (2.5 mg total) by mouth 3 (three) times daily. 90 capsule 5   No current facility-administered medications for this visit.    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  Tobacco Use   Smoking status: Never   Smokeless tobacco: Never  Vaping Use   Vaping Use: Never used  Substance and Sexual Activity   Alcohol use: Never   Drug use: Never   Sexual activity: Yes    Birth control/protection: None  Other Topics Concern   Not on file  Social History Narrative   Right handed   Drinks caffeine   Two story home   Social Determinants of Health   Financial Resource Strain: Not on file  Food Insecurity: No Food Insecurity (03/28/2022)   Hunger Vital Sign    Worried About Running Out of Food in the Last Year: Never true    Ran Out of Food in the Last Year: Never true  Transportation Needs: Not on file  Physical Activity: Not on file  Stress: Not on file  Social Connections: Not on file  Intimate Partner Violence: Not on file   Family History  Problem Relation Age of Onset   Skin cancer Mother    Colon polyps Mother    Cancer Father    Colon cancer Father 25   Colon polyps Father    Rectal cancer Father    Hypertension Paternal Uncle    Cancer Maternal Grandmother    Stroke Maternal Grandfather    GER disease Other    Esophageal cancer Neg Hx    Inflammatory bowel disease Neg Hx    Liver disease Neg Hx    Pancreatic cancer Neg Hx    Stomach cancer Neg Hx    I have reviewed her medical, social, and family history in detail and updated the electronic medical record as necessary.    PHYSICAL EXAMINATION  BP 98/62   Pulse 70   Ht 5' 9" (1.753 m)   Wt 119 lb (54 kg)   SpO2 99%   BMI 17.57 kg/m  Wt Readings from Last 3 Encounters:  06/04/22 119 lb (54 kg)  04/02/22 115 lb (52.2 kg)  04/03/22 112 lb 14.4 oz (51.2 kg)  I also did repeat orthostatics during today's visit because of her "dizziness" Standing = 106/65, 80 Sitting = 104/60, 82 Supine =  100/62, 82 GEN: Appears nontoxic, appears fatigued but otherwise in NAD PSYCH: Cooperative, though her speech is slower than normal EYE: Conjunctivae pale-pink, sclerae anicteric ENT: MMM CV: Nontachycardic RESP: No audible wheezing GI: NABS, soft, minimially TTP in MEG MSK/EXT: No lower extremity edema SKIN: No jaundice NEURO:  Alert & Oriented x 3, no focal deficits   REVIEW OF DATA  I reviewed the following data at the time of this encounter:  GI Procedures and Studies  Previously reviewed  Laboratory Studies  No relevant studies to review though it has been a few months since then the last laboratories were performed  Imaging Studies  No new imaging studies to review  ASSESSMENT  Ms. Kovich is a 21 y.o. 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  The patient is seen today for evaluation and management of:  1. Chronic nausea   2. Generalized abdominal pain   3. Brain fog   4. Unintentional weight loss   5. Dizziness   6. Eating disorder, unspecified type    Although the patient is hemodynamically stable, I am quite concerned and the way the patient is presenting currently.  She is had a significant decline in her academics and in her mood and now has significant brain follow-up.  She continues to have concerns of weight loss though her weights have been stable over the course of the last few visits.  She is reporting that she has not been taking even at 1000 cal of food per day and sometimes probably no more than 500 cal/day.  The Marinol at 4 times a day may be too high of a dose for her and so of asked her to decrease that to no more than 3 uses per day.  I am going to go ahead and start her on a TCA to see if this can be helpful for functional  nausea but she is going to be talking with her psychiatrist next week and will make sure that this is okay.  I have given her my information to give to her psychiatrist so that he can give me a call to discuss things further.  I am very worried that we have probably dealing with an individual who has underlying mental health issues and is likely also battling a likely eating disorder even if she has chronic nausea her work-up has been extensive and thorough and I find nothing else that can be helpful at this point.  I think if the patient continues to have this low amount of caloric intake then she will need a feeding tube to be strongly considered.  With that being said I am going to get updated labs to see if there is evidence of potential for refeeding risk and see what her albumin and prealbumin look like.  She did not tolerate the Dobbhoff tube and so I think she could tolerate a core track so if things persist or progress in the course of the next few weeks then I think she may need to be brought in for a admission/observation at [facility] where a core track could be placed and then we can try to get her on a nutrition plan.  I do think that psychiatry is going to be key for her.  I will reach out to her primary care provider who has been a great help for her.  The patient is willing for me to talk with her family if necessary in the future.  Lets see how she does with the initiation of the TCA this week (only take at nighttime) continue the Marinol.  I am going to add Compazine to try to get her through any significant nausea if possible.  Needs to try to push and eat further.  But again if things progress in the next 1 to 2 weeks before my 4-week follow-up, then she may need to come into the   PLAN  Marinol no more than 3 times daily Restart Compazine 5 mg every 8 hour nausea standing We will initiate Elavil 50  mg nightly Patient will be following up with psychiatry next week and will discuss this  with them I want her to give psychiatry my number so that I can have opportunity to talk with him as to his concerns and my concerns for this patient and her mental health and the potential of an eating disorder also playing some role with her situation SIBO breath testing was not performed though not clear it is related her main issue at this time Appreciate nutrition working with her and trying to optimize and get some calories in her Laboratories to be obtained as outlined below My biggest concern is that the decreased food intake is causing her issues causing her brain fog in addition to her mental health issues and that we may need to bring her in to [facility] for a core track to be placed (she did not tolerate Dobbhoff tube previously) Psychiatry is key to helping [place] with her issues Appreciate PCP assistance as well  I think there is a high risk of this patient needing inpatient care Referral to [facility] certainly reasonable at this point if desired   Orders Placed This Encounter  Procedures   CBC   Comp Met (CMET)   Amylase   Lipase   TSH   Prealbumin     New Prescriptions   AMITRIPTYLINE (ELAVIL) 50 MG TABLET    Take 1 tablet (50 mg total) by mouth at bedtime.   PROCHLORPERAZINE (COMPAZINE) 5 MG TABLET    Take 1 tablet (5 mg total) by mouth every 8 (eight) hours as needed for nausea or vomiting.   Modified Medications   Modified Medication Previous Medication   DRONABINOL (MARINOL) 2.5 MG CAPSULE dronabinol (MARINOL) 2.5 MG capsule      Take 1 capsule (2.5 mg total) by mouth 3 (three) times daily.    Take 1 capsule (2.5 mg total) by mouth in the morning, at noon, in the evening, and at bedtime.    Planned Follow Up No follow-ups on file.   Total Time in Face-to-Face and in Coordination of Care for patient including independent/personal interpretation/review of prior testing, medical history, examination, medication adjustment, communicating results with the  patient directly, and documentation with the EHR is 30 minutes.   Justice Britain, MD [facility] Office # [phone]

## 2022-06-04 NOTE — Progress Notes (Signed)
Appointment start time: 8:10  Appointment end time: 9:00  Patient was seen on 06/04/2022 for nutrition counseling pertaining to disordered eating  Primary care provider: Christen Butter, NP Therapist: none  ROI: N/A Any other medical team members: Norlene Duel, MD ([facility] psychiatrist) Parents: none   Assessment  Pt arrives stating she has an appt today with GI specialist. States she has been looking for therapist but unable to find one she may connect with. States she tried [employer] and it doesn't make her feel good. States she remembers being at the store looking for lactose-free whole milk and does not remember seeing it in the store. Reports she was looking for Lactaid only. States she does not drink milk for fun, only will consume with cereal. States she has been working on increasing water intake to 64 oz/day.  States  yesterday she had class at 8:30 am then an hour break in which she napped and wasn't hungry during the time. States she did not eat and then had 2 classes back to back until 1:15 pm and then went to [employer] to eat first meal of the deal.    Growth Metrics: not provided Goal rate of weight gain:  0.5-1.0 lb/week  Eating history: Length of time: 4 years ago Previous treatments: yes; states she tried a bunch of nutrition shakes but stomach did not agree Goals for RD meetings: improve constipation/diarrhea, dizziness/lightheadedness, headaches/body aches, heart racing/chest pain, focus/concentration, cold intolerance  Weight history:  Highest weight: 135-140  Lowest weight: 108 Most consistent weight: 115  What would you like to weigh: no, wants to feel like she has energy again How has weight changed in the past year: has stayed consistently low  Medical Information:  Changes in hair, skin, nails since ED started: none Chewing/swallowing difficulties: none Reflux or heartburn: yes, not taking medication for it States she was taking something for  it but stopped being prescribed for it.  Trouble with teeth: pain when she eats sweeter items LMP without the use of hormones: N/A  Weight at that point: N/A Effect of exercise on menses: N/A   Effect of hormones on menses: has IUD; with the intent to help with endometriosis and cramps; bleeds sometimes Constipation, diarrhea: yes, both, most recently constipation and will alternate  Dizziness/lightheadedness: yes, multiple times/day Headaches/body aches: yes Heart racing/chest pain: yes Mood: not great Sleep: not great; was sleeping 1-2 hrs/night and now sleeping 4-6 hrs/night in the past few days Focus/concentration: a lot of challenges Cold intolerance: yes Vision changes: sometimes her eyes don't focus  Mental health diagnosis: ARFID   Dietary assessment: A typical day consists of 1 meals and 3 snacks  Safe foods include: bread, croissants, bagels, crispy rice cereal, eggs (occasionally)  Avoided foods include: most vegetables (prefers to have them in a smoothie)  24 hour recall:  B: sleep until 8 am S (9:45 am):   L (2:30 pm): [employer] - grilled cheese + 3/4 strawberry refresher with water  S (3:30 pm): chocolate croissant D (6 pm): breakfast sandwich (egg, cheese) S (10 pm): 2 biscuits + butter   Beverages: refresher (16 oz), water (34 oz); 50 oz  Physical activity: walks dog 15-20 min/day, 7 days/week   What Methods Do You Use To Control Your Weight (Compensatory behaviors)?  none  Estimated energy intake: 1800-1900 kcal  Estimated energy needs: 2000-2200 kcal 250-275 g CHO 100-110 g pro 67-73 g fat  Nutrition Diagnosis: NI-1.4 Inadequate energy intake As related to disordered eating.  As evidenced by irrational beliefs about the effects of food on the body.  Intervention/Goals: Pt was encouraged with increasing fluid intake and introducing butter from home to her regimen. Discussed ways to increase fluid intake and ways to increase nourishment during  downtime between classes. We reviewed different options for lactose-milk at her preferred local grocery store. Discussed importance of including mental health provider who is eating disorder informed on her team. Pt agreed with goals listed. Goals: - Get reconnected with therapist. Contact Creed Copper Young [phone]) or Dessie Coma [phone]). - Use lactose-free whole milk with Carnation Instant breakfast.  - Aim to drink 1.5 bottles of Lifewater a day in addition to refresher.   - Aim to have try parfait as a snack option during 1 hr break between classes.   Meal plan:    3 meals    2-3 snacks  Monitoring and Evaluation: Patient will follow up in 2 weeks.

## 2022-06-05 LAB — PREALBUMIN: Prealbumin: 25 mg/dL (ref 17–34)

## 2022-06-06 ENCOUNTER — Telehealth: Payer: Self-pay | Admitting: Gastroenterology

## 2022-06-06 NOTE — Telephone Encounter (Signed)
Good Afternoon Dr Rush Landmark  Inbound call from patient calling in regards to MyChart message. Please advise.  Thank you

## 2022-06-07 ENCOUNTER — Encounter: Payer: Self-pay | Admitting: Gastroenterology

## 2022-06-07 DIAGNOSIS — F509 Eating disorder, unspecified: Secondary | ICD-10-CM | POA: Insufficient documentation

## 2022-06-07 DIAGNOSIS — R42 Dizziness and giddiness: Secondary | ICD-10-CM | POA: Insufficient documentation

## 2022-06-07 DIAGNOSIS — R4189 Other symptoms and signs involving cognitive functions and awareness: Secondary | ICD-10-CM | POA: Insufficient documentation

## 2022-06-07 NOTE — Telephone Encounter (Signed)
Please reach out to Jaime Bauer and let her know that I agree she is not been doing well as I outlined in my note and things certainly do not concerned about if things are going to get better.  I would certainly like her to at least have a few days trial over the course of these days into next week when she talked with her psychiatrist with the TCA on board to see if that makes any difference. If by next week, she continues to not have increased her caloric intake then we will need to set her up for [facility] admission/observation for placement of a Cortrak feeding tube (did not tolerate Dobbhoff due to the size) to see if we can optimize her nutritional intake and if this were to be helpful for her to continue this versus then considering the need for a PEG tube. Nutrition can help [place] if that is the case and we can ask if psychiatry can also be helpful for evaluation as an inpatient too. Please make sure that she has my information so that her psychiatrist next week can reach out to [place] to let us know his thoughts in regards to what he thinks needs to be done for her as well. I have discussed her case briefly with her primary care provider as well so she is aware. Thus, plan of action will be to see where things stand next week and if not doing better then move forward with getting her brought into the hospital.  We would ask if our hospitalist team would be able to bring her on their team and GI will follow along throughout her hospitalization and optimization of nutrition. Thanks. GM

## 2022-06-07 NOTE — Telephone Encounter (Signed)
Please see note on patient's MyChart for next steps.

## 2022-06-10 ENCOUNTER — Encounter: Payer: Self-pay | Admitting: Gastroenterology

## 2022-06-11 ENCOUNTER — Encounter: Payer: Self-pay | Admitting: Gastroenterology

## 2022-06-11 ENCOUNTER — Telehealth: Payer: Self-pay | Admitting: Gastroenterology

## 2022-06-11 NOTE — Telephone Encounter (Signed)
Inbound call from patients mother stating that she will be in town at the end of this week and there had been some talk about a change in medication and possibly a feeding tube. Patients mother is requesting a call back to discuss. Please advise.

## 2022-06-11 NOTE — Telephone Encounter (Signed)
Patient is returning your call.  

## 2022-06-11 NOTE — Telephone Encounter (Signed)
Dr Rush Landmark the pt's mother would like a call to discuss the pt's treatment plan  Nena, Hampe (Mother) [phone] [facility])

## 2022-06-11 NOTE — Telephone Encounter (Signed)
FYI Dr Rush Landmark returning your call

## 2022-06-11 NOTE — Telephone Encounter (Signed)
Patty, Please help coordinate with patient for her to come in and sign a release so that I can get the patient's psychiatry records and so that I can talk with him, hopefully before the end of the week. Also find out what the dosing of the cyproheptadine is that he is prescribing. Also, please ensure that she is okay with [place] talking with her family as I believe her mother is calling to discuss things further and may be coming into town. Thanks. GM

## 2022-06-12 NOTE — Telephone Encounter (Signed)
Hadn't called patient's mother back yet, but I will try later today vs tomorrow. Thanks.

## 2022-06-13 NOTE — Telephone Encounter (Signed)
Called and spoke with patient's mom whom I had not had permission from the patient to talk with at any time in the past and home have spoken with previously. Went over the concerns most recent clinic visit. Concern for brain got access issues and her mental health not being where it needs to be. Also with concern for the potential of an eating disorder playing some role with her situation. It seems that the patient's family will be bringing the patient back home for support.  It looks like she has been set up with a psychologist who is helpful in eating disorders to see if there is something that could be better understood in regards to the patient's symptomatology. Overall feel that patient's extensive GI work-up shows that this is likely functional nausea and functional abdominal pain. She will initiate the amitriptyline and maintain that particular dose. I will be happy to be available and offer my thoughts and discussion with any other providers that she gives permission to in [place] (home further). I will be available via MyChart in the interim. I hope that Ms. Whobrey will be able to have some improvements overall and that I get to see her in the coming months doing better.   Corliss Parish, MD [facility] Office # [phone]   I have not received any of the patient's psychiatry records so hopefully they will be available for my review in the next week.  The patient had come in and signed a release of information form earlier this week.  Patty and Rovonda, let me know if something turns up.

## 2022-06-20 ENCOUNTER — Encounter: Payer: Self-pay | Admitting: Gastroenterology

## 2022-06-25 ENCOUNTER — Ambulatory Visit: Payer: No Typology Code available for payment source | Admitting: Registered"

## 2022-07-09 ENCOUNTER — Ambulatory Visit: Payer: No Typology Code available for payment source | Admitting: Registered"

## 2022-07-14 ENCOUNTER — Encounter: Payer: Self-pay | Admitting: Gastroenterology

## 2022-07-16 MED ORDER — DRONABINOL 2.5 MG PO CAPS: 2.5000 mg | ORAL_CAPSULE | Freq: Three times a day (TID) | ORAL | 5 refills | Status: DC

## 2022-07-16 NOTE — Telephone Encounter (Signed)
Patty, Please reach back out to the patient and find out what pharmacy she wants (she has moved back to [place] for a period of time). Marinol at her current dosing 3 times daily can be continued 90 tablets/12 refills Thanks. GM

## 2022-07-22 ENCOUNTER — Encounter: Payer: Self-pay | Admitting: Gastroenterology

## 2022-07-22 NOTE — Telephone Encounter (Signed)
Please let patient know that I think it would be ideal for her to have a quaternary center evaluation either near her home in [place] or let us go ahead and have a quaternary referral placed to [facility] or [facility] or [facility] for second opinion.  Since it is not clear when she will be heading back here at least getting a appointment on the books since it will be 8 weeks out makes sense. Find out what her dosing of Elavil is at this point. GM

## 2022-07-25 NOTE — Telephone Encounter (Signed)
I would tell her, that if things are progressing/worsening then evaluation at a local hospital or with the previous GI provider that had seen her before she moved to [facility], could help in the interim, since she is not going to be local for awhile.   I can be available to see any updated labs in the interim. This is a difficult situation overall. GM

## 2022-08-07 ENCOUNTER — Telehealth: Payer: Self-pay | Admitting: Gastroenterology

## 2022-08-07 NOTE — Telephone Encounter (Signed)
Called and spoke with patient. She gives verbal consent over the phone that I can speak with Dr. Armstead Peaks. I left a voicemail with the provider and will look forward to a callback vs call tomorrow. GM

## 2022-08-07 NOTE — Telephone Encounter (Signed)
Good afternoon Dr Rush Landmark  We received a call from Armstead Peaks with [facility] phycology group for mental health. Wanting to consult with you about patient care.  Ubaldo Glassing can be reached directly at [phone] anytime before 3 pm today .  She will be with patients from 3 pm until 8 pm today. If you are not able to reach back with her she is available tomorrow before 11 am or after 4 pm .

## 2022-08-09 NOTE — Telephone Encounter (Signed)
I received a call back from Dr. Tommi Rumps and we had a 15-minute conversation about the patient's clinical course including the role of feeding tube placement as well as her continued psychotherapy.  Patient's weight has decreased to approximately 110 pounds.  When she was admitted briefly in 2023 for nasal gastric feeding tube placement she had dropped her weight down to 106-107 pounds.  We will be happy to be available for further discussions as needed.  I have asked her to send in a request of information should any of our patient charts be necessary. I told her it is ideal for the patient to follow-up with the quaternary center evaluation at [facility] that we have placed to just make sure that nothing else is being missed as she continues to get worked up for her functional nausea/vomiting and her food intolerance and concern for potential eating disorder.

## 2022-08-20 ENCOUNTER — Encounter: Payer: Self-pay | Admitting: Gastroenterology

## 2022-08-24 NOTE — Telephone Encounter (Signed)
Patty, Please reach out to the patient. If she is flying to [place] we can see her at the end of the day if she is coming with her family as well. Otherwise you can tell her we can do a virtual visit on Wednesday afternoon. Thanks. GM

## 2022-08-28 ENCOUNTER — Telehealth: Payer: No Typology Code available for payment source | Admitting: Gastroenterology

## 2022-08-28 ENCOUNTER — Other Ambulatory Visit: Payer: Self-pay

## 2022-08-28 DIAGNOSIS — E878 Other disorders of electrolyte and fluid balance, not elsewhere classified: Secondary | ICD-10-CM

## 2022-08-28 DIAGNOSIS — Z862 Personal history of diseases of the blood and blood-forming organs and certain disorders involving the immune mechanism: Secondary | ICD-10-CM

## 2022-08-28 NOTE — Progress Notes (Unsigned)
GASTROENTEROLOGY OUTPATIENT CLINIC VISIT   Primary Care Provider Samuel Bouche, NP [address] [address] [phone]  Patient Profile: Jaime Bauer is a 22 y.o. 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  The patient presents to the [facility] for an evaluation and management of problem(s) noted below:  Problem List No diagnosis found.   History of Present Illness Please see prior notes for full details of HPI.  Interval History The patient returns for follow-up today.  I last saw the patient a few weeks ago when she was interviewing me for her on her stasis in regards to colon cancer screening.  Unfortunately, it looks like the patient has had significant decline as she describes a new issue of brain fogginess.  Depressed mood.  And she continues to express weight loss issues.  She is eating minimal amounts due to her chronic nausea.  She is dealing with more constipation currently rather than diarrhea.  Her last bowel movement was before the weekend.  Unfortunately things have had a downturn in her studies as well and she is potentially going to need incompletes so that her GPA does not progressively worsen.  She is at a loss.  She feels very frustrated and sad.  She continues to use Marinol but has increased that up to 4 times daily.  Patient states she ate less than 1000 cal total yesterday and just has no desire for eating because of her nausea.  She has been seeing nutrition and trying to increase her intake but has not been successful.  She is interested in thinking about feeding tube again even though she did not tolerate the previous feeding tube in her nose for more than a few hours.  She is going to be  seeing her psychiatrist on Monday.  GI Review of Systems Positive as above including progressive anorexia Negative for dysphagia, odynophagia, melena, hematochezia  Review of Systems General: Reports continued weight loss (though her weight has been stable to increased from her prior visits); denies fevers/chills Cardiovascular: Denies chest pain Pulmonary: Denies shortness of breath Gastroenterological: See HPI Genitourinary: Denies darkened urine Hematological: Denies easy bruising/bleeding Dermatological: Denies jaundice Psychological: Mood is distraught, she denies suicidal ideation or homicidal ideation   Medications Current Outpatient Medications  Medication Sig Dispense Refill   amitriptyline (ELAVIL) 50 MG tablet Take 1 tablet (50 mg total) by mouth at bedtime. 30 tablet 1   dronabinol (MARINOL) 2.5 MG capsule Take 1 capsule (2.5 mg total) by mouth 3 (three) times daily. 90 capsule 5   FLUoxetine (PROZAC) 20 MG capsule Take 60 mg by mouth every morning.     levonorgestrel (KYLEENA) 19.5 MG IUD 1 each by Intrauterine route once.     prochlorperazine (COMPAZINE) 5 MG tablet Take 1 tablet (5 mg total) by mouth every 8 (eight) hours as needed for nausea or vomiting. 30 tablet 1   zonisamide (ZONEGRAN) 100 MG capsule Take 150 mg by mouth daily.     No current facility-administered medications for this visit.    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  Tobacco Use   Smoking status: Never   Smokeless tobacco: Never  Vaping Use   Vaping Use: Never used  Substance and Sexual Activity   Alcohol use: Never   Drug use: Never   Sexual activity: Yes    Birth control/protection: None  Other Topics Concern   Not on file  Social History Narrative   Right handed   Drinks caffeine   Two story home   Social Determinants of Health   Financial Resource Strain: Not on file  Food Insecurity: No Food Insecurity (03/28/2022)   Hunger Vital Sign    Worried About Running Out of Food in the Last Year: Never true    Ran Out of Food in the Last Year: Never true  Transportation Needs: Not on file  Physical Activity: Not on file  Stress: Not on file  Social Connections: Not on file  Intimate Partner Violence: Not on file   Family History  Problem Relation Age of Onset   Skin cancer Mother    Colon polyps Mother    Cancer Father    Colon cancer Father 105   Colon polyps Father    Rectal cancer Father    Hypertension Paternal Uncle    Cancer Maternal Grandmother    Stroke Maternal Grandfather    GER disease Other    Esophageal cancer Neg Hx    Inflammatory bowel disease Neg Hx    Liver disease Neg Hx    Pancreatic cancer Neg Hx    Stomach cancer Neg Hx    I have reviewed her medical, social, and family history in detail and updated the electronic medical record as necessary.    PHYSICAL EXAMINATION  There were no vitals taken for this visit. Wt Readings from Last 3 Encounters:  06/04/22 119 lb (54 kg)  04/02/22 115 lb (52.2 kg)  04/03/22 112 lb 14.4 oz (51.2 kg)  I also did repeat orthostatics during today's visit because of her "dizziness" Standing = 106/65, 80 Sitting = 104/60, 82 Supine = 100/62, 82 GEN: Appears nontoxic, appears fatigued but otherwise in NAD PSYCH: Cooperative, though her speech is slower than normal EYE: Conjunctivae pale-pink, sclerae anicteric ENT:  MMM CV: Nontachycardic RESP: No audible wheezing GI: NABS, soft, minimially TTP in MEG MSK/EXT: No lower extremity edema SKIN: No jaundice NEURO:  Alert & Oriented x 3, no focal deficits   REVIEW OF DATA  I reviewed the following data at the time of this encounter:  GI Procedures and Studies  Previously reviewed  Laboratory Studies  No relevant studies to review though it has been a few months since then the last laboratories were performed  Imaging Studies  No new imaging studies to review   ASSESSMENT  Jaime Bauer is a 22 y.o. 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  The patient is seen today for evaluation and management of:  No diagnosis found.  Although the patient is hemodynamically stable, I am quite concerned and the way the patient is presenting currently.  She is had a significant decline in her academics and in her mood and now has significant brain follow-up.  She continues to have concerns of weight loss though her weights have been stable over the course of the last few visits.  She is reporting that she has not been taking even at 1000 cal of food per day and sometimes probably no more than 500 cal/day.  The Marinol at 4 times a day may be too high of a dose for her and so of asked her to decrease that to no more than 3 uses per day.  I am going to go ahead and start her on a TCA to see if this can be helpful for functional nausea but she is going to be talking with her psychiatrist next week and will make sure that this is okay.  I have given her my information to give to her psychiatrist so that he can give me a call to discuss things further.  I am very worried that we have probably dealing with an individual who has underlying mental health  issues and is likely also battling a likely eating disorder even if she has chronic nausea her work-up has been extensive and thorough and I find nothing else that can be helpful at this point.  I think if the patient continues to have this low amount of caloric intake then she will need a feeding tube to be strongly considered.  With that being said I am going to get updated labs to see if there is evidence of potential for refeeding risk and see what her albumin and prealbumin look like.  She did not tolerate the Dobbhoff tube and so I think she could tolerate a core track so if things persist or progress in the course of the next few weeks then I think she may need to be brought in for a admission/observation at [facility] where a core track could be placed and then we can try to get her on a nutrition plan.  I do think that psychiatry is going to be key for her.  I will reach out to her primary care provider who has been a great help for her.  The patient is willing for me to talk with her family if necessary in the future.  Lets see how she does with the initiation of the TCA this week (only take at nighttime) continue the Marinol.  I am going to add Compazine to try to get her through any significant nausea if possible.  Needs to try to push and eat further.  But again if things progress in the next 1 to 2 weeks before my 4-week follow-up, then she may need to come into the   PLAN  Marinol no more than 3 times daily Restart Compazine 5 mg every 8 hour nausea standing We will initiate Elavil 50 mg nightly Patient will be following up with psychiatry next week and will discuss this with them I want her to give psychiatry my number so that I can have opportunity to talk with him as to his concerns and my concerns for this patient and her mental health and the potential of an eating disorder also playing some role with her situation SIBO breath testing was not performed though not clear it is related her  main issue at this time Appreciate nutrition  working with her and trying to optimize and get some calories in her Laboratories to be obtained as outlined below My biggest concern is that the decreased food intake is causing her issues causing her brain fog in addition to her mental health issues and that we may need to bring her in to [facility] for a core track to be placed (she did not tolerate Dobbhoff tube previously) Psychiatry is key to helping [place] with her issues Appreciate PCP assistance as well  I think there is a high risk of this patient needing inpatient care Referral to quaternary center certainly reasonable at this point if desired   No orders of the defined types were placed in this encounter.    New Prescriptions   No medications on file   Modified Medications   No medications on file    Planned Follow Up No follow-ups on file.   Total Time in Face-to-Face and in Coordination of Care for patient including independent/personal interpretation/review of prior testing, medical history, examination, medication adjustment, communicating results with the patient directly, and documentation with the EHR is 30 minutes.   Corliss Parish, MD [facility] Office # [phone]

## 2022-08-28 NOTE — Progress Notes (Signed)
Orders for labs entered. Pt will come to office 08/29/22 and have labs done.

## 2022-08-29 ENCOUNTER — Other Ambulatory Visit (INDEPENDENT_AMBULATORY_CARE_PROVIDER_SITE_OTHER): Payer: No Typology Code available for payment source

## 2022-08-29 DIAGNOSIS — Z862 Personal history of diseases of the blood and blood-forming organs and certain disorders involving the immune mechanism: Secondary | ICD-10-CM

## 2022-08-29 DIAGNOSIS — E878 Other disorders of electrolyte and fluid balance, not elsewhere classified: Secondary | ICD-10-CM

## 2022-08-29 LAB — CK: Total CK: 28 U/L (ref 7–177)

## 2022-08-29 LAB — IBC + FERRITIN
Ferritin: 28.4 ng/mL (ref 10.0–291.0)
Iron: 95 ug/dL (ref 42–145)
Saturation Ratios: 32.6 % (ref 20.0–50.0)
TIBC: 291.2 ug/dL (ref 250.0–450.0)
Transferrin: 208 mg/dL — ABNORMAL LOW (ref 212.0–360.0)

## 2022-08-29 LAB — VITAMIN B12: Vitamin B-12: 1013 pg/mL — ABNORMAL HIGH (ref 211–911)

## 2022-08-29 LAB — PHOSPHORUS: Phosphorus: 3.4 mg/dL (ref 2.3–4.6)

## 2022-08-29 LAB — MAGNESIUM: Magnesium: 1.9 mg/dL (ref 1.5–2.5)

## 2022-08-29 LAB — CORTISOL: Cortisol, Plasma: 4.4 ug/dL

## 2022-08-30 ENCOUNTER — Other Ambulatory Visit: Payer: No Typology Code available for payment source

## 2022-08-30 ENCOUNTER — Telehealth (INDEPENDENT_AMBULATORY_CARE_PROVIDER_SITE_OTHER): Payer: No Typology Code available for payment source | Admitting: Gastroenterology

## 2022-08-30 DIAGNOSIS — R110 Nausea: Secondary | ICD-10-CM

## 2022-08-30 DIAGNOSIS — R1084 Generalized abdominal pain: Secondary | ICD-10-CM | POA: Diagnosis not present

## 2022-08-30 DIAGNOSIS — R634 Abnormal weight loss: Secondary | ICD-10-CM | POA: Diagnosis not present

## 2022-08-30 DIAGNOSIS — R630 Anorexia: Secondary | ICD-10-CM | POA: Diagnosis not present

## 2022-08-30 LAB — T3: T3, Total: 91 ng/dL (ref 76–181)

## 2022-08-30 MED ORDER — PROCHLORPERAZINE 25 MG RE SUPP: 25.0000 mg | Freq: Two times a day (BID) | RECTAL | 2 refills | Status: DC | PRN

## 2022-08-30 NOTE — Patient Instructions (Signed)
We have sent the following medications to your pharmacy for you to pick up at your convenience: Compazine   Your provider has requested that you go to the basement level for lab work before leaving today. Press "B" on the elevator. The lab is located at the first door on the left as you exit the elevator.  _______________________________________________________  If your blood pressure at your visit was 140/90 or greater, please contact your primary care physician to follow up on this.  _______________________________________________________  If you are age 28 or older, your body mass index should be between 23-30. Your There is no height or weight on file to calculate BMI. If this is out of the aforementioned range listed, please consider follow up with your Primary Care Provider.  If you are age 74 or younger, your body mass index should be between 19-25. Your There is no height or weight on file to calculate BMI. If this is out of the aformentioned range listed, please consider follow up with your Primary Care Provider.   ________________________________________________________  The [facility] providers would like to encourage you to use [facility] to communicate with providers for non-urgent requests or questions.  Due to long hold times on the telephone, sending your provider a message by [facility] may be a faster and more efficient way to get a response.  Please allow 48 business hours for a response.  Please remember that this is for non-urgent requests.  _______________________________________________________  Due to recent changes in healthcare laws, you may see the results of your imaging and laboratory studies on MyChart before your provider has had a chance to review them.  We understand that in some cases there may be results that are confusing or concerning to you. Not all laboratory results come back in the same time frame and the provider may be waiting for multiple results in order to  interpret others.  Please give [place] 48 hours in order for your provider to thoroughly review all the results before contacting the office for clarification of your results.   Thank you for choosing me and [facility].  Dr. Rush Landmark

## 2022-08-30 NOTE — Progress Notes (Unsigned)
GASTROENTEROLOGY OUTPATIENT CLINIC VISIT   Primary Care Provider Samuel Bouche, NP [address] [address] [place] [address] [phone]  Patient Profile: Jaime Bauer is a 22 y.o. 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  The patient presents to the [facility] for an evaluation and management of problem(s) noted below:  Problem List No diagnosis found.   History of Present Illness Please see prior notes for full details of HPI.  Interval History The patient returns for follow-up today.  I last saw the patient a few weeks ago when she was interviewing me for her on her stasis in regards to colon cancer screening.  Unfortunately, it looks like the patient has had significant decline as she describes a new issue of brain fogginess.  Depressed mood.  And she continues to express weight loss issues.  She is eating minimal amounts due to her chronic nausea.  She is dealing with more constipation currently rather than diarrhea.  Her last bowel movement was before the weekend.  Unfortunately things have had a downturn in her studies as well and she is potentially going to need incompletes so that her GPA does not progressively worsen.  She is at a loss.  She feels very frustrated and sad.  She continues to use Marinol but has increased that up to 4 times daily.  Patient states she ate less than 1000 cal total yesterday and just has no desire for eating because of her nausea.  She has been seeing nutrition and trying to increase her intake but has not been successful.  She is interested in thinking about feeding tube again even though she did not tolerate the previous feeding tube in her nose for more than a few hours.  She is going to be  seeing her psychiatrist on Monday.  GI Review of Systems Positive as above including progressive anorexia Negative for dysphagia, odynophagia, melena, hematochezia  Review of Systems General: Reports continued weight loss (though her weight has been stable to increased from her prior visits); denies fevers/chills Cardiovascular: Denies chest pain Pulmonary: Denies shortness of breath Gastroenterological: See HPI Genitourinary: Denies darkened urine Hematological: Denies easy bruising/bleeding Dermatological: Denies jaundice Psychological: Mood is distraught, she denies suicidal ideation or homicidal ideation   Medications Current Outpatient Medications  Medication Sig Dispense Refill   amitriptyline (ELAVIL) 50 MG tablet Take 1 tablet (50 mg total) by mouth at bedtime. 30 tablet 1   dronabinol (MARINOL) 2.5 MG capsule Take 1 capsule (2.5 mg total) by mouth 3 (three) times daily. 90 capsule 5   FLUoxetine (PROZAC) 20 MG capsule Take 60 mg by mouth every morning.     levonorgestrel (KYLEENA) 19.5 MG IUD 1 each by Intrauterine route once.     prochlorperazine (COMPAZINE) 5 MG tablet Take 1 tablet (5 mg total) by mouth every 8 (eight) hours as needed for nausea or vomiting. 30 tablet 1   zonisamide (ZONEGRAN) 100 MG capsule Take 150 mg by mouth daily.     No current facility-administered medications for this visit.    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  Tobacco Use   Smoking status: Never   Smokeless tobacco: Never  Vaping Use   Vaping Use: Never used  Substance and Sexual Activity   Alcohol use: Never   Drug use: Never   Sexual activity: Yes    Birth control/protection: None  Other Topics Concern   Not on file  Social History Narrative   Right handed   Drinks caffeine   Two story home   Social Determinants of Health   Financial Resource Strain: Not on file  Food Insecurity: No Food Insecurity (03/28/2022)   Hunger Vital Sign    Worried About Running Out of Food in the Last Year: Never true    Ran Out of Food in the Last Year: Never true  Transportation Needs: Not on file  Physical Activity: Not on file  Stress: Not on file  Social Connections: Not on file  Intimate Partner Violence: Not on file   Family History  Problem Relation Age of Onset   Skin cancer Mother    Colon polyps Mother    Cancer Father    Colon cancer Father 105   Colon polyps Father    Rectal cancer Father    Hypertension Paternal Uncle    Cancer Maternal Grandmother    Stroke Maternal Grandfather    GER disease Other    Esophageal cancer Neg Hx    Inflammatory bowel disease Neg Hx    Liver disease Neg Hx    Pancreatic cancer Neg Hx    Stomach cancer Neg Hx    I have reviewed her medical, social, and family history in detail and updated the electronic medical record as necessary.    PHYSICAL EXAMINATION  There were no vitals taken for this visit. Wt Readings from Last 3 Encounters:  06/04/22 119 lb (54 kg)  04/02/22 115 lb (52.2 kg)  04/03/22 112 lb 14.4 oz (51.2 kg)  I also did repeat orthostatics during today's visit because of her "dizziness" Standing = 106/65, 80 Sitting = 104/60, 82 Supine = 100/62, 82 GEN: Appears nontoxic, appears fatigued but otherwise in NAD PSYCH: Cooperative, though her speech is slower than normal EYE: Conjunctivae pale-pink, sclerae anicteric ENT:  MMM CV: Nontachycardic RESP: No audible wheezing GI: NABS, soft, minimially TTP in MEG MSK/EXT: No lower extremity edema SKIN: No jaundice NEURO:  Alert & Oriented x 3, no focal deficits   REVIEW OF DATA  I reviewed the following data at the time of this encounter:  GI Procedures and Studies  Previously reviewed  Laboratory Studies  No relevant studies to review though it has been a few months since then the last laboratories were performed  Imaging Studies  No new imaging studies to review   ASSESSMENT  Ms. Porath is a 22 y.o. 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  The patient is seen today for evaluation and management of:  No diagnosis found.  Although the patient is hemodynamically stable, I am quite concerned and the way the patient is presenting currently.  She is had a significant decline in her academics and in her mood and now has significant brain follow-up.  She continues to have concerns of weight loss though her weights have been stable over the course of the last few visits.  She is reporting that she has not been taking even at 1000 cal of food per day and sometimes probably no more than 500 cal/day.  The Marinol at 4 times a day may be too high of a dose for her and so of asked her to decrease that to no more than 3 uses per day.  I am going to go ahead and start her on a TCA to see if this can be helpful for functional nausea but she is going to be talking with her psychiatrist next week and will make sure that this is okay.  I have given her my information to give to her psychiatrist so that he can give me a call to discuss things further.  I am very worried that we have probably dealing with an individual who has underlying mental health  issues and is likely also battling a likely eating disorder even if she has chronic nausea her work-up has been extensive and thorough and I find nothing else that can be helpful at this point.  I think if the patient continues to have this low amount of caloric intake then she will need a feeding tube to be strongly considered.  With that being said I am going to get updated labs to see if there is evidence of potential for refeeding risk and see what her albumin and prealbumin look like.  She did not tolerate the Dobbhoff tube and so I think she could tolerate a core track so if things persist or progress in the course of the next few weeks then I think she may need to be brought in for a admission/observation at [facility] where a core track could be placed and then we can try to get her on a nutrition plan.  I do think that psychiatry is going to be key for her.  I will reach out to her primary care provider who has been a great help for her.  The patient is willing for me to talk with her family if necessary in the future.  Lets see how she does with the initiation of the TCA this week (only take at nighttime) continue the Marinol.  I am going to add Compazine to try to get her through any significant nausea if possible.  Needs to try to push and eat further.  But again if things progress in the next 1 to 2 weeks before my 4-week follow-up, then she may need to come into the   PLAN  Marinol no more than 3 times daily Restart Compazine 5 mg every 8 hour nausea standing We will initiate Elavil 50 mg nightly Patient will be following up with psychiatry next week and will discuss this with them I want her to give psychiatry my number so that I can have opportunity to talk with him as to his concerns and my concerns for this patient and her mental health and the potential of an eating disorder also playing some role with her situation SIBO breath testing was not performed though not clear it is related her  main issue at this time Appreciate nutrition  working with her and trying to optimize and get some calories in her Laboratories to be obtained as outlined below My biggest concern is that the decreased food intake is causing her issues causing her brain fog in addition to her mental health issues and that we may need to bring her in to [facility] for a core track to be placed (she did not tolerate Dobbhoff tube previously) Psychiatry is key to helping [place] with her issues Appreciate PCP assistance as well  I think there is a high risk of this patient needing inpatient care Referral to quaternary center certainly reasonable at this point if desired   No orders of the defined types were placed in this encounter.    New Prescriptions   No medications on file   Modified Medications   No medications on file    Planned Follow Up No follow-ups on file.   Total Time in Face-to-Face and in Coordination of Care for patient including independent/personal interpretation/review of prior testing, medical history, examination, medication adjustment, communicating results with the patient directly, and documentation with the EHR is 30 minutes.   Corliss Parish, MD [facility] Office # [phone]

## 2022-09-01 ENCOUNTER — Encounter: Payer: Self-pay | Admitting: Gastroenterology

## 2022-09-02 LAB — ALPHA-GAL PANEL
Allergen, Mutton, f88: <0.1 kU/L
Allergen, Pork, f26: <0.1 kU/L
Beef: <0.1 kU/L
CLASS: 0
CLASS: 0
Class: 0
GALACTOSE-ALPHA-1,3-GALACTOSE IGE*: <0.1 kU/L (ref <0.10)

## 2022-09-02 LAB — INTERPRETATION:

## 2022-09-05 DIAGNOSIS — I774 Celiac artery compression syndrome: Secondary | ICD-10-CM

## 2022-09-05 HISTORY — DX: Celiac artery compression syndrome: I77.4

## 2022-09-23 ENCOUNTER — Encounter: Payer: Self-pay | Admitting: Gastroenterology

## 2022-09-27 ENCOUNTER — Ambulatory Visit: Payer: No Typology Code available for payment source | Admitting: Gastroenterology

## 2023-01-07 ENCOUNTER — Telehealth: Payer: Self-pay | Admitting: Gastroenterology

## 2023-01-07 NOTE — Telephone Encounter (Signed)
Inbound call from patient wishing schedule a OV with Dr. Meridee Score. States she will be in [place] soon to be able to be seen. States here clinic in [place] recommended her to have a feeding tube places and to follow up with Dr. Meridee Score. She is requesting a call back to discuss appointment. Please advise, thank you.

## 2023-01-07 NOTE — Telephone Encounter (Signed)
She certainly will need follow-up. Also, it looks like she was planning to see one of the surgeons at either [facility] or [facility] for consideration of surgical intervention. I think that it is best for her that if she is going to have surgery, that she has surgery done at 1 of these quaternary centers. I think it would be difficult for our surgeons to want to move forward with a surgical evaluation after she has been evaluated at the centers. 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 When she is coming back to [facility], yes I am okay to have her see [place] back in follow-up and we can certainly place a referral to interventional radiology for PEG tube placement. Before we would get the PEG tube placed however, we would need to get her into see nutrition so that we can get recommendations for her tube feeding. So lets see what she wants to do and what she ends up doing with her follow-ups. When is she planning to return? Thanks. GM

## 2023-01-07 NOTE — Telephone Encounter (Signed)
Dr Meridee Score-  Please see recent history on this patient. She has history with you and was most recently seen at your request with [facility] and [facility] regarding weight loss. They have given her recommendation for feeding tube placement upon her return to [place]. Does she need office follow up with you or should we arrange IR placement directly?

## 2023-01-08 ENCOUNTER — Encounter: Payer: Self-pay | Admitting: Gastroenterology

## 2023-01-08 NOTE — Telephone Encounter (Signed)
Patient states that she is planning on returning to [place] next week. 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 She also states that she saw a dietitian at [facility] who is the one recommending feeding tube placement; they recommended following with her primary gastroenterologist for this procedure.   I am glad to get the patient scheduled for follow up in the office to discuss in more detail. However, first available appointment is currently 04/23/23. Do you need to see her sooner than that, or is that date ok?

## 2023-01-08 NOTE — Telephone Encounter (Signed)
Before I get referrals going and an appointment with you set up, I would just like to confirm that you are okay with managing this patient's tube feedings should they be needed following IR placement. If I am to place orders, would we need home health nutrition to calculate and manage on an outpatient basis? It is my understanding that outpatient nutrition will not manage these feedings.

## 2023-01-08 NOTE — Telephone Encounter (Signed)
Dottie, Please schedule this patient for 3:50 PM slot with me, my next available which looks to be in July. Please put in a referral to nutrition so that we can work on getting what tube feeds she may need for calories to be outlined. As it looks like based on the notes she has been gaining weight, I would hold on putting in the percutaneous gastrostomy tube but we can certainly get in a referral to IR as soon as I talk with her in clinic. If she really wants to just have the feeding tube placed to have it available, we could do that but she is gone months now without needing it with stable/increasing weight based on the [facility] and [facility] records. Thanks. GM

## 2023-01-09 NOTE — Telephone Encounter (Signed)
Jori Moll question. I have also reviewed the report from the nutritionist as well. Before we place a PEG tube, we will need good evaluation in regards to what her nutritional needs will need to be as well as what her calorie counts will need to be. Also, I do not have an issue with being the main person to sign off on continue tube feeds, but I do have an issue with is that I am not comfortable in making adjustments in regards to types of nutrition or types of tube feeds if the patient is not able to tolerate what nutrition initially asks as well as with the titration of it upwards. We can certainly have home health help [place] with this, if they will do that. Because we do not have a robust nutrition outside of oncology group, it is possible she may need to have this referral for this procedure done at one of the quaternary centers as well as a referral to nutrition at one of the quaternary centers to help [place] with this. That is what I would let her know unless we know for sure that we can someone help with making adjustments on nutrition based on how she is doing. That is the difficulty in regards to outpatient tube feeds in individuals who do not have underlying malignancies. Thanks. I would ask our other nurses their thoughts in regards to how they have managed this previously, if they have.  If they have not, then again I think we would need to place a referral to one of the quaternary centers for nutrition consultation and subsequently as long as we have follow-up with them then we can work on getting the PEG tube placed. GM

## 2023-01-10 NOTE — Telephone Encounter (Signed)
Jori Moll work and thank you for putting in the time to try to get this set up. As we do not have many patients to do this, it is quite helpful to know that this is possible for patients in the outpatient setting. Please let the patient know that I have read the nutritionist and consultation records from [facility] clinic and [facility], and since they all feel that it would be ideal for her to have a PEG tube for nutritional purposes, we are now okay with her moving forward with that. She can wait till my office visit to discuss this further or if she wants to move forward with [place] been getting the IR appointment set up, we can go ahead and place that referral. Please let me know what she decides. Thanks. GM

## 2023-01-10 NOTE — Telephone Encounter (Signed)
Patient is scheduled for office follow up with Dr Meridee Score on 02/19/23 at 350pm. I have spoken with Pam at [facility] (phone [phone]) who states that they have a nutritionist that can do an initial consult to decide nutrition/calorie needs of the patient before any placement of PEG. After PEG is placed, their nutritionist will also manage feedings and adjust as needed with Dr Meridee Score signing off. Once we have a PEG placement date in place, we can contact [facility] for them to begin the process of deciding on nutrition needs.

## 2023-01-10 NOTE — Telephone Encounter (Signed)
PT is requesting a call back to discuss the appointment she was scheduled for 7/17. Please advise

## 2023-01-13 ENCOUNTER — Encounter: Payer: Self-pay | Admitting: Gastroenterology

## 2023-01-13 DIAGNOSIS — E43 Unspecified severe protein-calorie malnutrition: Secondary | ICD-10-CM

## 2023-01-13 DIAGNOSIS — R630 Anorexia: Secondary | ICD-10-CM

## 2023-01-13 DIAGNOSIS — R634 Abnormal weight loss: Secondary | ICD-10-CM

## 2023-01-13 NOTE — Telephone Encounter (Signed)
I have spoken to patient to make her aware that we have scheduled her for office follow up with Dr Meridee Score and I have explained that we will also be working with [facility] regarding nutrition consult/ongoing nutritional needs if she chooses to go forward with PEG placement. Advised that she can discuss PEG placement with Dr Meridee Score at her upcoming visit or if she is ready to go ahead and move forward with IR placement prior to the visit, we can also place a referral for that. Patient tells me that she has an upcoming appointment with another surgeon and would also like his opinion prior to going forward with PEG placement. She would like to make her decision after that visit and let us know her plans at that time. Therefore, currently, patient is scheduled only for upcoming office visit with Dr Meridee Score.

## 2023-01-13 NOTE — Telephone Encounter (Signed)
Jori Moll work. Thanks for keeping me UTD. GM

## 2023-01-15 NOTE — Telephone Encounter (Signed)
Inbound call from patient returning phone call regarding recent mychart messages. Please advise, thank you.

## 2023-01-15 NOTE — Telephone Encounter (Addendum)
I have spoken with Jaime Bauer at [facility] IR (phone [phone]) to schedule Gastrostomy placement following patient's 02/19/23 appointment with Dr Meridee Score. Tiffany at IR says she will be in contact with patient regarding this procedure.   I have left a message for patient to call back.

## 2023-01-15 NOTE — Telephone Encounter (Signed)
See 01/13/23 "patient message" for further correspondence.

## 2023-01-16 NOTE — Telephone Encounter (Signed)
Patient has spoken with IR. Scheduled for PEG placement 02/25/23, following her appointment with Dr Meridee Score on 02/19/23.

## 2023-01-16 NOTE — Telephone Encounter (Signed)
I have advised patient that someone from radiology will be in touch with her in the coming days regarding PEG placement. Also advised that once we get a date for placement, we will be in touch with [facility] for initial nutrition consult. She verbalizes understanding.

## 2023-01-16 NOTE — Telephone Encounter (Signed)
Inbound call from patient she is wanting to go over the time and date for PEG placement .Please advise

## 2023-01-31 ENCOUNTER — Other Ambulatory Visit (HOSPITAL_COMMUNITY): Payer: No Typology Code available for payment source

## 2023-02-11 ENCOUNTER — Ambulatory Visit: Payer: No Typology Code available for payment source | Admitting: Gastroenterology

## 2023-02-19 ENCOUNTER — Other Ambulatory Visit (INDEPENDENT_AMBULATORY_CARE_PROVIDER_SITE_OTHER): Payer: No Typology Code available for payment source

## 2023-02-19 ENCOUNTER — Encounter: Payer: Self-pay | Admitting: Gastroenterology

## 2023-02-19 ENCOUNTER — Ambulatory Visit (INDEPENDENT_AMBULATORY_CARE_PROVIDER_SITE_OTHER): Payer: No Typology Code available for payment source | Admitting: Gastroenterology

## 2023-02-19 VITALS — BP 110/62 | HR 98 | Ht 69.0 in | Wt 120.0 lb

## 2023-02-19 DIAGNOSIS — R4189 Other symptoms and signs involving cognitive functions and awareness: Secondary | ICD-10-CM

## 2023-02-19 DIAGNOSIS — K582 Mixed irritable bowel syndrome: Secondary | ICD-10-CM

## 2023-02-19 DIAGNOSIS — I774 Celiac artery compression syndrome: Secondary | ICD-10-CM | POA: Diagnosis not present

## 2023-02-19 DIAGNOSIS — R630 Anorexia: Secondary | ICD-10-CM | POA: Diagnosis not present

## 2023-02-19 DIAGNOSIS — R634 Abnormal weight loss: Secondary | ICD-10-CM

## 2023-02-19 DIAGNOSIS — R110 Nausea: Secondary | ICD-10-CM

## 2023-02-19 DIAGNOSIS — R1084 Generalized abdominal pain: Secondary | ICD-10-CM | POA: Diagnosis not present

## 2023-02-19 LAB — CBC
HCT: 37.8 % (ref 36.0–46.0)
Hemoglobin: 12.6 g/dL (ref 12.0–15.0)
MCHC: 33.4 g/dL (ref 30.0–36.0)
MCV: 87.8 fl (ref 78.0–100.0)
Platelets: 334 10*3/uL (ref 150.0–400.0)
RBC: 4.31 Mil/uL (ref 3.87–5.11)
RDW: 12.5 % (ref 11.5–15.5)
WBC: 6.1 10*3/uL (ref 4.0–10.5)

## 2023-02-19 LAB — COMPREHENSIVE METABOLIC PANEL
ALT: 8 U/L (ref 0–35)
AST: 11 U/L (ref 0–37)
Albumin: 4.6 g/dL (ref 3.5–5.2)
Alkaline Phosphatase: 48 U/L (ref 39–117)
BUN: 17 mg/dL (ref 6–23)
CO2: 28 mEq/L (ref 19–32)
Calcium: 10 mg/dL (ref 8.4–10.5)
Chloride: 101 mEq/L (ref 96–112)
Creatinine, Ser: 0.66 mg/dL (ref 0.40–1.20)
GFR: 124.18 mL/min (ref >60.00)
Glucose, Bld: 86 mg/dL (ref 70–99)
Potassium: 3.8 mEq/L (ref 3.5–5.1)
Sodium: 136 mEq/L (ref 135–145)
Total Bilirubin: 0.5 mg/dL (ref 0.2–1.2)
Total Protein: 7.1 g/dL (ref 6.0–8.3)

## 2023-02-19 LAB — IBC + FERRITIN
Ferritin: 10.9 ng/mL (ref 10.0–291.0)
Iron: 158 ug/dL — ABNORMAL HIGH (ref 42–145)
Saturation Ratios: 42 % (ref 20.0–50.0)
TIBC: 376.6 ug/dL (ref 250.0–450.0)
Transferrin: 269 mg/dL (ref 212.0–360.0)

## 2023-02-19 LAB — LIPASE: Lipase: 16 U/L (ref 11.0–59.0)

## 2023-02-19 LAB — AMYLASE: Amylase: 33 U/L (ref 27–131)

## 2023-02-19 LAB — PROTIME-INR
INR: 1.2 ratio — ABNORMAL HIGH (ref 0.8–1.0)
Prothrombin Time: 12.8 s (ref 9.6–13.1)

## 2023-02-19 LAB — PHOSPHORUS: Phosphorus: 3.6 mg/dL (ref 2.3–4.6)

## 2023-02-19 LAB — MAGNESIUM: Magnesium: 2 mg/dL (ref 1.5–2.5)

## 2023-02-19 LAB — SEDIMENTATION RATE: Sed Rate: 4 mm/hr (ref 0–20)

## 2023-02-19 LAB — C-REACTIVE PROTEIN: CRP: <1 mg/dL (ref 0.5–20.0)

## 2023-02-19 NOTE — Patient Instructions (Addendum)
Your provider has requested that you go to the basement level for lab work before leaving today. Press "B" on the elevator. The lab is located at the first door on the left as you exit the elevator.  Follow-up :  03/18/23 at 3:50 pm with Dr Meridee Score   Due to recent changes in healthcare laws, you may see the results of your imaging and laboratory studies on MyChart before your provider has had a chance to review them.  We understand that in some cases there may be results that are confusing or concerning to you. Not all laboratory results come back in the same time frame and the provider may be waiting for multiple results in order to interpret others.  Please give [place] 48 hours in order for your provider to thoroughly review all the results before contacting the office for clarification of your results.   _______________________________________________________  If your blood pressure at your visit was 140/90 or greater, please contact your primary care physician to follow up on this.  _______________________________________________________  If you are age 23 or older, your body mass index should be between 23-30. Your Body mass index is 17.72 kg/m. If this is out of the aforementioned range listed, please consider follow up with your Primary Care Provider.  If you are age 56 or younger, your body mass index should be between 19-25. Your Body mass index is 17.72 kg/m. If this is out of the aformentioned range listed, please consider follow up with your Primary Care Provider.   ________________________________________________________  The [facility] providers would like to encourage you to use [facility] to communicate with providers for non-urgent requests or questions.  Due to long hold times on the telephone, sending your provider a message by [facility] may be a faster and more efficient way to get a response.  Please allow 48 business hours for a response.  Please remember that this is for  non-urgent requests.  _______________________________________________________  Thank you for choosing me and [facility].  Dr. Meridee Score

## 2023-02-20 ENCOUNTER — Encounter: Payer: Self-pay | Admitting: Gastroenterology

## 2023-02-20 LAB — INTERPRETATION:

## 2023-02-20 LAB — ALPHA-GAL PANEL: CLASS: 0

## 2023-02-20 NOTE — Telephone Encounter (Signed)
Jaime Bauer, You can let the patient know that if she does want to move forward with her current scheduled date of surgery at [facility], that we can certainly initiate her on TPN.  We would need to get a PICC in place.  We would also need to get home services to help [place] with initiating her TPN needs. Is it possible that [facility] will be able to help [place] with the dietitian to help [place] with TPN as an outpatient for new initiation? Thanks. GM

## 2023-02-23 ENCOUNTER — Encounter: Payer: Self-pay | Admitting: Gastroenterology

## 2023-02-23 DIAGNOSIS — I774 Celiac artery compression syndrome: Secondary | ICD-10-CM | POA: Insufficient documentation

## 2023-02-23 NOTE — Progress Notes (Signed)
GASTROENTEROLOGY OUTPATIENT CLINIC VISIT   Primary Care Provider Christen Butter, NP [address] [place] [address] [phone]  Patient Profile: Jaime Bauer is a 22 y.o. female with a pmh significant for family history colorectal cancer (father at age 89), anxiety/depression, concern for previous eating disorder, concussions, endometriosis (status post IUD), likely IBS-C/D, functional chronic abdominal pain, functional chronic nausea, possible median arcuate ligament syndrome versus celiac artery compression syndrome.  The patient presents to the [facility] for an evaluation and management of problem(s) noted below:  Problem List 1. Median arcuate ligament syndrome (HCC)   2. Generalized abdominal pain   3. Unintentional weight loss   4. Anorexia   5. Nausea without vomiting   6. Brain fog   7. Irritable bowel syndrome with both constipation and diarrhea     History of Present Illness Please see prior notes for full details of HPI.  Interval History The patient is seen today in follow-up with her partner.  I last spoke with the patient in January.  She was in the process of being evaluated at [facility] and [facility] in [place].  Her workup has been reviewed extensively and it looks like after vascular Doppler imaging and vascular angiogram and provocative arteriogram she may have median arcuate ligament syndrome versus celiac artery compression syndrome.  She has been working with nutrition over the course of the last few months and there is consideration of whether a feeding tube versus TPN will need to be considered.  A celiac block was performed but that was not helpful for her pain.  She is considering surgical options as early as August but is wondering whether she may want to wait until December.  She is working with a Paramedic and a psychiatrist.  She is back here to try to begin getting back up-to-date on her schooling.  She  is still dealing with brain fog.  She is still dealing with abdominal discomfort in her lower abdomen that is sometimes associated with improvement after bowel habits but sometimes not.  She is dealing with constipation currently rather than diarrhea.  Thankfully her weight has somewhat been stable without needing enteral nutrition.  GI Review of Systems Positive as above including floating at times Negative for odynophagia, dysphagia, melena, hematochezia  Review of Systems General: Denies fevers/chills Cardiovascular: Denies chest pain Pulmonary: Denies shortness of breath Gastroenterological: See HPI Genitourinary: Denies darkened urine Hematological: Denies easy bruising/bleeding Dermatological: Denies jaundice Psychological: Mood is stable  Medications Current Outpatient Medications  Medication Sig Dispense Refill   FLUoxetine (PROZAC) 20 MG capsule Take 80 mg by mouth every morning.     Fremanezumab-vfrm (AJOVY) 225 MG/1.5ML SOSY Inject into the skin every 30 (thirty) days.     hyoscyamine (LEVSIN) 0.125 MG/5ML ELIX Take 0.125 mg by mouth.     levonorgestrel (KYLEENA) 19.5 MG IUD 1 each by Intrauterine route once.     dronabinol (MARINOL) 2.5 MG capsule Take 1 capsule (2.5 mg total) by mouth 3 (three) times daily. (Patient not taking: Reported on 02/19/2023) 90 capsule 5   prochlorperazine (COMPAZINE) 25 MG suppository Place 1 suppository (25 mg total) rectally every 12 (twelve) hours as needed for nausea or vomiting. (Patient not taking: Reported on 02/19/2023) 6 suppository 2   prochlorperazine (COMPAZINE) 5 MG tablet Take 1 tablet (5 mg total) by mouth every 8 (eight) hours as needed for nausea or vomiting. (Patient not taking: Reported on 02/19/2023) 30 tablet 1   zonisamide (ZONEGRAN)  100 MG capsule Take 150 mg by mouth daily. (Patient not taking: Reported on 02/19/2023)     No current facility-administered medications for this visit.    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   Chronic headaches    Concussion    w/o LOC   Depression    History of multiple concussions    from soccer   Syncope    Past Surgical History:  Procedure Laterality Date   COLONOSCOPY  01/09/2021   NO PAST SURGERIES     UPPER GASTROINTESTINAL ENDOSCOPY  01/09/2021   2020- Fl keys   Social History   Socioeconomic History   Marital status: Single    Spouse name: Not on file   Number of children: 0   Years of education: 14   Highest education level: Not on file  Occupational History   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  Tobacco Use   Smoking status: Never   Smokeless tobacco: Never  Vaping Use   Vaping status: Never Used  Substance and Sexual Activity   Alcohol use: Never   Drug use: Never   Sexual activity: Yes    Birth control/protection: None  Other Topics Concern   Not on file  Social History Narrative   Right handed   Drinks caffeine   Two story home   Social Determinants of Health   Financial Resource Strain: Not on file  Food Insecurity: No Food Insecurity (11/08/2022)   [facility], [facility]   Hunger Vital Sign    Worried About Running Out of Food in the Last Year: Never true    Ran Out of Food in the Last Year: Never true  Transportation Needs: No Transportation Needs (11/08/2022)   Received from [facility], [facility]   [facility] - Transportation    Lack of Transportation (Medical): No    Lack of Transportation (Non-Medical): No  Physical Activity: Inactive (11/08/2022)   Received from [facility], [facility]   Exercise Vital Sign    Days of Exercise per Week: 0 days    Minutes of Exercise per Session: 0 min  Stress: Not on file  Social Connections: Unknown (05/13/2022)   Received from [facility]   Social Network    Social Network: Not on file  Intimate Partner Violence: Unknown (05/13/2022)   Received from  [facility]   HITS    Physically Hurt: Not on file    Insult or Talk Down To: Not on file    Threaten Physical Harm: Not on file    Scream or Curse: Not on file   Family History  Problem Relation Age of Onset   Skin cancer Mother    Colon polyps Mother    Cancer Father    Colon cancer Father 71   Colon polyps Father    Rectal cancer Father    Hypertension Paternal Uncle    Cancer Maternal Grandmother    Stroke Maternal Grandfather    GER disease Other    Esophageal cancer Neg Hx    Inflammatory bowel disease Neg Hx    Liver disease Neg Hx    Pancreatic cancer Neg Hx    Stomach cancer Neg Hx    I have reviewed her medical, social, and family history in detail and updated the electronic medical record as necessary.    PHYSICAL EXAMINATION  Telemedicine visit   REVIEW OF DATA  I  reviewed the following data at the time of this encounter:  GI Procedures and Studies  Previously reviewed  Laboratory Studies  Patient's labs reviewed  Imaging Studies  Outside imaging over the course of 2024 2/24 mesenteric artery Doppler IMPRESSION  ----------  AORTA  No evidence of abdominal aortic aneurysm.  MESENTERIC VESSELS  Celiac: Dynamic elevated velocities due to median arcuate ligament compression.  Hepatic: Patent.  Splenic: Patent.  Superior mesenteric artery: 0-69% stenosis. No evidence of hemodynamically  significant stenosis.  Inferior mesenteric artery: 0-69% stenosis. No evidence of hemodynamically  significant stenosis.   March 2024 MRI abdomen/pelvis enterography IMPRESSION: 1.  Normal enterography evaluation of the small bowel. 2.  Benign left renal cyst.  March 2024 CT abdomen angiogram Impression Severe, dynamic narrowing of the celiac artery at its origin during expiration as can be seen with median arcuate ligament syndrome in the appropriate clinical setting.   ASSESSMENT  Ms. Cuartas is a 22 y.o. 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  The patient is seen today for evaluation and management of:  1. Median arcuate ligament syndrome (HCC)   2. Generalized abdominal pain   3. Unintentional weight loss   4. Anorexia   5. Nausea without vomiting   6. Brain fog   7. Irritable bowel syndrome with both constipation and diarrhea    The patient presents today and is hemodynamically stable.  Clinically she continues to suffer from her GI issues in regards to pain and discomfort alteration of bowel habits with previous unintentional weight loss though there seems to be some stability currently.  Her workup both at [facility] and [facility] has been extensive and the current consideration of compression of the celiac artery syndrome first median arcuate ligament syndrome is now at play.  She is tentatively scheduled for a surgery in August but she is not sure if she wants to pursue that now or pursue it in December since she was told that it may not be the end all of helping all of her symptoms but she is still considering this.  The other consideration of whether we would move forward with feeding tube placement which is currently scheduled for next week versus TPN still needs to be considered 2.  We have worked with our home health services and certainly can pursue either option.  Should we move forward with a feeding tube, the ability of making sure that there is not a problem for her surgery needs to be insured and so she is going to reach out to Dr. Odis Luster of [facility] and ensure that there are no other complicating factors with feeding tube placement, I would suspect that it would be removed at time of surgery.  However it does not  make great sense to pursue a PEG tube if she may have that removed at the time of surgery and just a few weeks.  TPN may make more sense.  I greatly appreciate the [facility] and [facility] clinic providers who have done an extensive further workup and trying to see if we have other reasons for her symptoms though underlying functional abdominal pain likely remains a significant issue and may be something that we will have to deal with after surgical interventions.  If she is not to have  surgery until December, PEG tube placement may make more sense.  She does understand that if we have difficulty titrating her feeds to be cyclical at night, she may require hospitalization.  She will update me with messaging from her [facility].   PLAN  TPN via PICC line or nutritional feeding via PEG still up in the air - Awaiting follow-up discussion from surgery as to whether PEG tube may complicate upcoming possible surgery in August - If surgery not to be considered until December then PEG tube makes more sense than TPN - If August surgery is still ago, will need to strongly consider moving forward with PICC line for TPN Appreciate psychiatry and psychology May continue Zofran or Compazine as needed Continue Compazine PR if needed Continue Marinol 2.5 mg 3 times daily as needed, though patient has not used in a while Continue Levsin Preprocedure labs as outlined below and she will be high risk for refeeding   Orders Placed This Encounter  Procedures   CBC   Comp Met (CMET)   Amylase   Lipase   Sedimentation rate   C-reactive protein   IBC + Ferritin   INR/PT   Magnesium   Phosphorus   Zinc   Ionized calcium, neonatal   Prealbumin     New Prescriptions   No medications on file   Modified Medications   No medications on file    Planned Follow Up No follow-ups on file.   Total Time in Face-to-Face and in Coordination of Care for patient including independent/personal  interpretation/review of prior testing, medical history, examination, medication adjustment, communicating results with the patient directly, and documentation with the EHR is 40 minutes.   Corliss Parish, MD [facility] Office # [phone]

## 2023-02-24 ENCOUNTER — Telehealth: Payer: Self-pay | Admitting: Gastroenterology

## 2023-02-24 ENCOUNTER — Other Ambulatory Visit: Payer: Self-pay | Admitting: Radiology

## 2023-02-24 DIAGNOSIS — E43 Unspecified severe protein-calorie malnutrition: Secondary | ICD-10-CM

## 2023-02-24 NOTE — Consult Note (Incomplete)
Chief Complaint: Patient was seen in consultation today for percutaneous gastrostomy tube placement  Referring Physician(s): Mansouraty,Gabriel Jr.  Supervising Physician: Ruel Favors  Patient Status: [facility] - Out-pt  History of Present Illness: Jaime Bauer is a 22 y.o. 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  She has severe protein calorie malnutrition and is scheduled today for percutaneous gastrostomy tube placement.  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  Medications: Prior to Admission medications   Medication Sig Start Date End Date Taking? Authorizing Provider  dronabinol (MARINOL) 2.5 MG capsule Take 1 capsule (2.5 mg total) by mouth 3 (three) times daily. Patient not taking: Reported on 02/19/2023 07/16/22   Mansouraty, Netty Starring., MD  FLUoxetine (PROZAC) 20 MG capsule Take 80 mg by mouth every morning. 02/20/21   [provider]  Fremanezumab-vfrm (AJOVY) 225 MG/1.5ML SOSY Inject into the skin every 30 (thirty) days.    [provider]  hyoscyamine (LEVSIN) 0.125 MG/5ML ELIX Take 0.125 mg by mouth.    [provider]  levonorgestrel (KYLEENA) 19.5 MG IUD 1 each by Intrauterine route once.    [provider]  prochlorperazine  (COMPAZINE) 25 MG suppository Place 1 suppository (25 mg total) rectally every 12 (twelve) hours as needed for nausea or vomiting. Patient not taking: Reported on 02/19/2023 08/30/22   Mansouraty, Netty Starring., MD  prochlorperazine (COMPAZINE) 5 MG tablet Take 1 tablet (5 mg total) by mouth every 8 (eight) hours as needed for nausea or vomiting. Patient not taking: Reported on 02/19/2023 06/04/22   Mansouraty, Netty Starring., MD  zonisamide (ZONEGRAN) 100 MG capsule Take 150 mg by mouth daily. Patient not taking: Reported on 02/19/2023    [provider]     Family History  Problem Relation Age of Onset   Skin cancer Mother    Colon polyps Mother    Cancer Father    Colon cancer Father 66   Colon polyps Father    Rectal cancer Father    Hypertension Paternal Uncle    Cancer Maternal Grandmother    Stroke Maternal Grandfather    GER disease Other    Esophageal cancer Neg Hx    Inflammatory bowel disease Neg Hx    Liver disease Neg Hx    Pancreatic cancer Neg Hx    Stomach cancer Neg Hx     Social History   Socioeconomic History   Marital status: Single    Spouse name: Not on file   Number of children: 0   Years of education: 14   Highest education level: Not on file  Occupational History   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  Tobacco Use   Smoking status: Never   Smokeless tobacco: Never  Vaping Use   Vaping status: Never Used  Substance and Sexual Activity   Alcohol use: Never  Drug use: Never   Sexual activity: Yes    Birth control/protection: None  Other Topics Concern   Not on file  Social History Narrative   Right handed   Drinks caffeine   Two story home   Social Determinants of Health   Financial Resource Strain: Not on file  Food Insecurity: No Food Insecurity (11/08/2022)   Received [facility]   Hunger Vital Sign    Worried About Running Out of Food in the Last Year: Never true    Ran Out of Food in the Last Year: Never true   Transportation Needs: No Transportation Needs (11/08/2022)   Received from [facility], [facility]   [facility] - Transportation    Lack of Transportation (Medical): No    Lack of Transportation (Non-Medical): No  Physical Activity: Inactive (11/08/2022)   Received from [facility], [facility]   Exercise Vital Sign    Days of Exercise per Week: 0 days    Minutes of Exercise per Session: 0 min  Stress: Not on file  Social Connections: Unknown (05/13/2022)   Received from [facility]   Social Network    Social Network: Not on file      Review of Systems  Vital Signs:    Code Status:     Physical Exam  Imaging: No results found.  Labs:  CBC: Recent Labs    06/04/22 1019 02/19/23 1702  WBC 5.6 6.1  HGB 12.7 12.6  HCT 37.2 37.8  PLT 315.0 334.0    COAGS: Recent Labs    02/19/23 1702  INR 1.2*    BMP: Recent Labs    06/04/22 1019 02/19/23 1702  NA 139 136  K 3.7 3.8  CL 107 101  CO2 24 28  GLUCOSE 96 86  BUN 15 17  CALCIUM 9.4 10.0  CREATININE 0.77 0.66    LIVER FUNCTION TESTS: Recent Labs    06/04/22 1019 02/19/23 1702  BILITOT 0.4 0.5  AST 11 11  ALT 11 8  ALKPHOS 52 48  PROT 7.3 7.1  ALBUMIN 4.6 4.6    TUMOR MARKERS: No results for input(s): "AFPTM", "CEA", "CA199", "CHROMGRNA" in the last 8760 hours.  Assessment and Plan: 22 y.o. 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  She has severe protein calorie malnutrition and is scheduled today for percutaneous gastrostomy tube placement.Risks and benefits image guided gastrostomy tube placement was discussed with the patient including, but not limited to the need for a barium enema during  the procedure, bleeding, infection, peritonitis and/or damage to adjacent structures.  All of the patient's questions were answered, patient is agreeable to proceed.  Consent signed and in chart.    Thank you for this interesting consult.  I greatly enjoyed meeting Jaime Bauer and look forward to participating in their care.  A copy of this report was sent to the requesting provider on this date.  Electronically Signed: D. Jeananne Rama, PA-C 02/24/2023, 12:38 PM   I spent a total of  25 minutes   in face to face in clinical consultation, greater than 50% of which was counseling/coordinating care for percutaneous gastrostomy tube placement

## 2023-02-24 NOTE — Telephone Encounter (Signed)
Patient returning call please advise. Thank you.

## 2023-02-24 NOTE — Telephone Encounter (Signed)
Inbound call from patient requesting a call back regarding recent mychart messages. Please advise, thank you.

## 2023-02-24 NOTE — Telephone Encounter (Signed)
Inbound call from Tiffany from [facility] with a call back number at [phone]. Please advise,

## 2023-02-25 ENCOUNTER — Ambulatory Visit (HOSPITAL_COMMUNITY)
Admission: RE | Admit: 2023-02-25 | Discharge: 2023-02-25 | Disposition: A | Discharge status: Home / Self Care | Payer: No Typology Code available for payment source | Source: Ambulatory Visit | Attending: Gastroenterology | Admitting: Gastroenterology

## 2023-02-25 ENCOUNTER — Ambulatory Visit (HOSPITAL_COMMUNITY): Payer: No Typology Code available for payment source

## 2023-02-25 ENCOUNTER — Other Ambulatory Visit: Payer: Self-pay | Admitting: *Deleted

## 2023-02-25 ENCOUNTER — Inpatient Hospital Stay (HOSPITAL_COMMUNITY)
Admission: RE | Admit: 2023-02-25 | Discharge: 2023-02-25 | Disposition: A | Discharge status: Home / Self Care | Payer: No Typology Code available for payment source | Source: Ambulatory Visit | Attending: Gastroenterology | Admitting: Gastroenterology

## 2023-02-25 ENCOUNTER — Other Ambulatory Visit: Payer: Self-pay | Admitting: Radiology

## 2023-02-25 DIAGNOSIS — R630 Anorexia: Secondary | ICD-10-CM | POA: Diagnosis not present

## 2023-02-25 DIAGNOSIS — R634 Abnormal weight loss: Secondary | ICD-10-CM | POA: Insufficient documentation

## 2023-02-25 DIAGNOSIS — E43 Unspecified severe protein-calorie malnutrition: Secondary | ICD-10-CM

## 2023-02-25 DIAGNOSIS — E46 Unspecified protein-calorie malnutrition: Secondary | ICD-10-CM

## 2023-02-25 LAB — ALPHA-GAL PANEL
Allergen, Mutton, f88: <0.1 kU/L
Beef: <0.1 kU/L
CLASS: 0
GALACTOSE-ALPHA-1,3-GALACTOSE IGE*: <0.1 kU/L (ref <0.10)

## 2023-02-25 LAB — PREALBUMIN
Prealbumin: 21 mg/dL (ref 17–34)
Prealbumin: 21 mg/dL (ref 18–38)

## 2023-02-25 LAB — COMPREHENSIVE METABOLIC PANEL
ALT: 12 U/L (ref 0–44)
AST: 13 U/L — ABNORMAL LOW (ref 15–41)
Albumin: 4 g/dL (ref 3.5–5.0)
Alkaline Phosphatase: 45 U/L (ref 38–126)
Anion gap: 7 (ref 5–15)
BUN: 20 mg/dL (ref 6–20)
CO2: 23 mmol/L (ref 22–32)
Calcium: 9 mg/dL (ref 8.9–10.3)
Chloride: 106 mmol/L (ref 98–111)
Creatinine, Ser: 0.65 mg/dL (ref 0.44–1.00)
GFR, Estimated: >60 mL/min (ref >60)
Glucose, Bld: 112 mg/dL — ABNORMAL HIGH (ref 70–99)
Potassium: 3.9 mmol/L (ref 3.5–5.1)
Sodium: 136 mmol/L (ref 135–145)
Total Bilirubin: 0.3 mg/dL (ref 0.3–1.2)
Total Protein: 6.9 g/dL (ref 6.5–8.1)

## 2023-02-25 LAB — MAGNESIUM: Magnesium: 2.2 mg/dL (ref 1.7–2.4)

## 2023-02-25 LAB — TRIGLYCERIDES: Triglycerides: 44 mg/dL (ref <150)

## 2023-02-25 LAB — PHOSPHORUS: Phosphorus: 3.5 mg/dL (ref 2.5–4.6)

## 2023-02-25 MED ORDER — AMBULATORY NON FORMULARY MEDICATION: 0 refills | Status: AC

## 2023-02-25 MED ORDER — HEPARIN SOD (PORK) LOCK FLUSH 100 UNIT/ML IV SOLN
INTRAVENOUS | Status: AC
  Filled 2023-02-25: qty 5

## 2023-02-25 MED ORDER — LIDOCAINE-EPINEPHRINE 1 %-1:100000 IJ SOLN
20.0000 mL | Freq: Once | INTRAMUSCULAR | Status: AC
  Administered 2023-02-25: 5 mL via INTRADERMAL

## 2023-02-25 MED ORDER — LIDOCAINE HCL 1 % IJ SOLN
INTRAMUSCULAR | Status: AC
  Filled 2023-02-25: qty 20

## 2023-02-25 MED ORDER — HEPARIN SOD (PORK) LOCK FLUSH 100 UNIT/ML IV SOLN
500.0000 [IU] | Freq: Once | INTRAVENOUS | Status: AC
  Administered 2023-02-25: 500 [IU] via INTRAVENOUS

## 2023-02-25 NOTE — Telephone Encounter (Signed)
I have spoken to Tiffany at IR Radiology regarding changing patient over from PEG placement to PICC placement instead. New order for PICC placement has been initiated as requested by Dr Meridee Score and PEG discontinued. Tiffany states that PICC can be placed at the same time/date as PEG was originally scheduled for; however, she would now need to arrive at 11 am for procedure and has no need for a driver since she will not be sedated for PICC. I have spoken to patient about this updated information and she is in agreement with this plan. I have also reached back out to [facility], Jeri Modena (Representative) to advise of change to PICC line and the need for TPN.

## 2023-02-25 NOTE — Telephone Encounter (Signed)
Mansouraty, Netty Starring., MD  You4 days ago   Dottie, FYI, let's see her follow up response and see if PICC placement may be possible. GM

## 2023-02-25 NOTE — Telephone Encounter (Signed)
Labs have been entered in [facility] for draw at time of PICC placement.

## 2023-02-25 NOTE — Addendum Note (Signed)
Addended by: Richardson Chiquito on: 02/25/2023 09:59 AM   Modules accepted: Orders

## 2023-02-25 NOTE — Telephone Encounter (Addendum)
-----   Message ----- From: Sedalia Muta Sent: 02/20/2023  11:10 PM EDT To: Richardson Chiquito, RN Subject: RE: New Enteral patient                        Renato Gails. Yes TPN is our specialty. We have a full TPN team with Pharmacists and dieticians who are experts in home TPN management.  I honestly wondered if that would not be a better option for Latesia based on the significance of her nutritional status and her long standing nutritional challenges. We have a home start TPN program so no need for an admission.  Dr. Meridee Score can be as involved as much or as little as he desires. Most providers request our pharmacist to manage the TPN for their patients, though we do send copies of labs to the provider and of course the team will reach out if there are needs. I will make this easy for you and Dr. Meridee Score.  Just let me know how you would like for [place] to proceed for Shizue. Feel free to call my cell if that is easier to discuss.  Pam

## 2023-02-25 NOTE — Telephone Encounter (Signed)
===  View-only below this line=== ----- Message ----- From: Sedalia Muta Sent: 02/25/2023   9:13 AM EDT To: Richardson Chiquito, RN Subject: RE: New Enteral patient                        Thank you for the early connect this AM.  Here are the labs for IR to pull when PICC is placed: CBC w/diff, CMP, prealbumin, triglycerides, Magnesium, Phosphorus.   Dr. Meridee Score will need to add order "TPN per [facility] protocol for labs and dosing".  I will see Layloni today when her PICC is placed to teach her to flush until TPN is started likely Thursday.    Thanks Dottie!

## 2023-02-25 NOTE — Telephone Encounter (Signed)
See 7/185/24 patient message for more details.

## 2023-02-25 NOTE — Procedures (Signed)
Interventional Radiology Procedure Note  Procedure: RUE SL POWER PICC    Complications: None  Estimated Blood Loss:  0  Findings: TIP SVCRA    Tamera Punt, MD

## 2023-02-26 ENCOUNTER — Telehealth: Payer: Self-pay | Admitting: *Deleted

## 2023-02-26 LAB — ALPHA-GAL PANEL
Allergen, Pork, f26: <0.1 kU/L
Class: 0

## 2023-02-26 LAB — ZINC: Zinc: 63 ug/dL (ref 60–130)

## 2023-02-26 NOTE — Telephone Encounter (Signed)
Dr Judie Petit-  [facility] is trying to get insurance to approve TPN for PICC placement but apparently they are exteremely stringent and require specific information to cover....  "----- Message ----- From: Sedalia Muta Sent: 02/26/2023   8:53 AM EDT To: Richardson Chiquito, RN Subject: FW: New Enteral patient                        Renato Gails.  Just making sure may in basket message did reach you. [employer] has strict criteria for TPN.   [employer] will need 1) documented reason clinically why a feeding tube is contraindicated- I am assuming with impending surgery concerns of having tube in place? 2) documented GI impairment related to inability for PO diet.  Let me know how we can help.  Her labs looked great per our  TPN team.  Thanks Dottie.  Pam"  Can you dictate a letter explaining that Arriyah's surgeon feels that anatomically, a PEG would obscure her upcoming surgery site and therefore PICC was needed? There also has to be documented GI impairment related to her inability to have oral diet.

## 2023-02-26 NOTE — Telephone Encounter (Signed)
Put together a letter. See what you think and send as able. GM

## 2023-02-27 ENCOUNTER — Encounter: Payer: Self-pay | Admitting: Gastroenterology

## 2023-02-27 NOTE — Telephone Encounter (Signed)
Delynn Flavin, RN  We are comfortable moving forward with the TPN for Solana.  I just spoke with our RN and with Todd. We will have everything delivered to the home and the RN visit between 3-4 PM today.

## 2023-03-14 ENCOUNTER — Telehealth: Payer: Self-pay | Admitting: Gastroenterology

## 2023-03-14 LAB — LAB REPORT - SCANNED: EGFR: 133

## 2023-03-14 NOTE — Telephone Encounter (Signed)
Dr Meridee Jaime Bauer the pt would like call at your convenience.

## 2023-03-14 NOTE — Telephone Encounter (Signed)
Patty, I am glad to hear that she had her surgery and hopefully this will help her in the long-term. If she still has her PICC line, then we can continue TPN that was initiated here in [place] when she returns. Before she returns, let's make sure that she has everything in place so we don't miss any days. Can use same services that were done previously. Does she have a return date? Thanks. GM

## 2023-03-14 NOTE — Telephone Encounter (Signed)
Inbound call from patient stating she just had her surgery at the [facility] clinic and they want her to continue PPN. Requesting a call back to discuss. Please advise.

## 2023-03-17 NOTE — Telephone Encounter (Signed)
Message sent to the pt with Dr Mansouraty's response

## 2023-03-18 ENCOUNTER — Ambulatory Visit (INDEPENDENT_AMBULATORY_CARE_PROVIDER_SITE_OTHER): Payer: No Typology Code available for payment source | Admitting: Gastroenterology

## 2023-03-18 ENCOUNTER — Encounter: Payer: Self-pay | Admitting: Gastroenterology

## 2023-03-18 VITALS — BP 96/60 | HR 76 | Ht 69.0 in | Wt 129.6 lb

## 2023-03-18 DIAGNOSIS — R110 Nausea: Secondary | ICD-10-CM | POA: Diagnosis not present

## 2023-03-18 DIAGNOSIS — R1084 Generalized abdominal pain: Secondary | ICD-10-CM | POA: Diagnosis not present

## 2023-03-18 DIAGNOSIS — I774 Celiac artery compression syndrome: Secondary | ICD-10-CM | POA: Diagnosis not present

## 2023-03-18 DIAGNOSIS — D509 Iron deficiency anemia, unspecified: Secondary | ICD-10-CM

## 2023-03-18 DIAGNOSIS — K5900 Constipation, unspecified: Secondary | ICD-10-CM

## 2023-03-18 MED ORDER — PROCHLORPERAZINE MALEATE 10 MG PO TABS: 10.0000 mg | ORAL_TABLET | Freq: Four times a day (QID) | ORAL | 3 refills | Status: DC | PRN

## 2023-03-18 NOTE — Progress Notes (Signed)
GASTROENTEROLOGY OUTPATIENT CLINIC VISIT   Primary Care Provider Jaime Butter, NP [address] [place] [address] [phone]  Patient Profile: Jaime Bauer is a 22 y.o. 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  The patient presents to the [facility] for an evaluation and management of problem(s) noted below:  Problem List 1. Celiac artery compression syndrome (HCC)   2. Median arcuate ligament syndrome (HCC)   3. Generalized abdominal pain   4. Chronic nausea   5. Constipation, unspecified constipation type     History of Present Illness Please see prior notes for full details of HPI.  Interval History The patient is seen in follow-up today with her partner and with her mother.  The patient did undergo median arcuate ligament release for celiac artery compression syndrome at [facility].  At the time she was tolerating a blenderized diet but still required TPN.  She remains on TPN.  She had a follow-up in clinic a few days after her discharge and at that time she was felt to be recovering appropriately.  She returned to [employer] to begin her classes next week.  She is having progressive abdominal pain since her surgery generalized throughout her abdomen.  Nausea continues to be an issue.  She is still using tincture of THC since she has not had significant improvement with other antiemetics previously.  Her weight has remained stable thankfully, though this is as a result of TPN most likely.  She is more constipated at this time and has not been taking her laxative therapy other than  stool softeners.  GI Review of Systems Positive as above Negative for odynophagia, dysphagia, melena, hematochezia  Review of Systems General: Denies fevers/chills Cardiovascular: Denies chest pain Pulmonary: Denies shortness of breath Gastroenterological: See HPI Genitourinary: Denies darkened urine Hematological: Denies easy bruising/bleeding Dermatological: Denies jaundice Psychological: Mood is concerned  Medications Current Outpatient Medications  Medication Sig Dispense Refill   AMBULATORY NON FORMULARY MEDICATION TPN per [facility] protocol for labs and dosing 1 each 0   FLUoxetine (PROZAC) 20 MG capsule Take 80 mg by mouth every morning.     Fremanezumab-vfrm (AJOVY) 225 MG/1.5ML SOSY Inject into the skin every 30 (thirty) days.     hyoscyamine (LEVSIN) 0.125 MG/5ML ELIX Take 0.125 mg by mouth.     levonorgestrel (KYLEENA) 19.5 MG IUD 1 each by Intrauterine route once.     methocarbamol (ROBAXIN) 750 MG tablet Take 750 mg by mouth 4 (four) times daily.     prochlorperazine (COMPAZINE) 10 MG tablet Take 1 tablet (10 mg total) by mouth every 6 (six) hours as needed for nausea or vomiting. 30 tablet 3   zonisamide (ZONEGRAN) 100 MG capsule Take 150 mg by mouth daily.     No current facility-administered medications for this visit.    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   Chronic headaches    Concussion    w/o LOC   Depression    History of multiple concussions    from soccer   Syncope    Past Surgical History:  Procedure Laterality Date   COLONOSCOPY  01/09/2021  Laparoscopic Median Arcuate Ligament Release     NO PAST SURGERIES     UPPER GASTROINTESTINAL ENDOSCOPY  01/09/2021   2020- Fl keys   Social History   Socioeconomic History   Marital status: Single    Spouse name: Not on file   Number of children: 0   Years of education: 14   Highest  education level: Not on file  Occupational History   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  Tobacco Use   Smoking status: Never   Smokeless tobacco: Never  Vaping Use   Vaping status: Never Used  Substance and Sexual Activity   Alcohol use: Never   Drug use: Never   Sexual activity: Yes    Birth control/protection: None  Other Topics Concern   Not on file  Social History Narrative   Right handed   Drinks caffeine   Two story home   Social Determinants of Health   Financial Resource Strain: Not on file  Food Insecurity: No Food Insecurity (03/12/2023)   Received from [facility]   Hunger Vital Sign    Worried About Running Out of Food in the Last Year: Never true    Ran Out of Food in the Last Year: Never true  Transportation Needs: No Transportation Needs (03/12/2023)   Received from [facility]    Lack of Transportation (Medical): No    Lack of Transportation (Non-Medical): No  Physical Activity: Inactive (11/08/2022)   Received [facility]   Exercise Vital Sign    Days of Exercise per Week: 0 days    Minutes of Exercise per Session: 0 min  Stress: Not on file  Social Connections: Unknown (05/13/2022)   [facility]   Social Network    Social Network: Not on file  Intimate Partner Violence: Patient Unable To Answer (03/12/2023)   Received from [facility]   Humiliation, Afraid, Rape, and Kick questionnaire    Fear of Current or Ex-Partner: Patient unable to answer    Emotionally Abused: Patient unable to answer    Physically Abused: Patient unable to answer    Sexually Abused: Patient unable to answer   Family History  Problem Relation Age of Onset   Skin cancer Mother    Colon polyps Mother    Cancer Father    Colon cancer Father 63   Colon polyps Father    Rectal cancer Father    Hypertension Paternal Uncle    Cancer Maternal Grandmother    Stroke Maternal Grandfather    GER disease Other    Esophageal cancer  Neg Hx    Inflammatory bowel disease Neg Hx    Liver disease Neg Hx    Pancreatic cancer Neg Hx    Stomach cancer Neg Hx    I have reviewed her medical, social, and family history in detail and updated the electronic medical record as necessary.    PHYSICAL EXAMINATION  BP 96/60   Pulse 76   Ht 5\' 9"  (1.753 m)   Wt 129 lb 9.6 oz (58.8 kg)   BMI 19.14 kg/m  GEN: NAD, appears stated age, doesn't appear chronically ill, partner at her side and mother at her side PSYCH: Cooperative, without pressured speech EYE: Conjunctivae pink, sclerae anicteric ENT: MMM CV: Nontachycardic RESP: No audible wheezing GI: NABS, soft, TTP in MEG, surgical scars present and well-healing with Surgicel still in place, nondistended, volitional guarding noted throughout abdomen  MSK/EXT: No significant lower extremity edema SKIN: No  jaundice NEURO:  Alert & Oriented x 3, no focal deficits   REVIEW OF DATA  I reviewed the following data at the time of this encounter:  GI Procedures and Studies  Previously reviewed  Laboratory Studies  Patient's labs reviewed  Imaging Studies  No new imaging studies to review   ASSESSMENT  Ms. Len is a 22 y.o. 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  The patient is seen today for evaluation and management of:  1. Celiac artery compression syndrome (HCC)   2. Median arcuate ligament syndrome (HCC)   3. Generalized abdominal pain   4. Chronic nausea   5. Constipation, unspecified constipation type    The patient is hemodynamically stable at this time.  Clinically she has not had significant improvement after  her median arcuate ligament release, if anything she has had progressive pain.  She needs updated cross-sectional imaging to rule out that there are any other issues or etiologies at this time.  Her chronic nausea is functional in origin.  I have done extensive workup as previously outlined in sure what else we can do at this time other than try to reinitiate some antiemetics slowly.  We will do that.  Hopefully all looks well on her imaging and then we will plan to pursue updated mesenteric Dopplers in a few weeks to follow-up her MALS.  She will continue TPN for now.  All patient questions were answered to the best of my ability, and the patient agrees to the aforementioned plan of action with follow-up as indicated.   PLAN  Continue TPN Appreciate psychiatry and psychology recommendations moving forward Compazine 10 mg every 6 hours as needed Patient may continue to use tincture of THC OTC that she has been using as needed - Cannot use Marinol at same time if she is using this Compazine PR can be sent if needed Abstaining from Zofran due to her migraines getting worse with the use of that Proceed with ordering CT abdomen pelvis with contrast Obtain laboratories that were recently drawn through home health - We will get laboratories drawn through home health as needed depend on how the patient is doing in the next couple of weeks Vascular mesenteric duplex to be done in second week of September so that can be reviewed by her surgeon at [facility]   Orders Placed This Encounter  Procedures   CT ABDOMEN PELVIS W CONTRAST   Ambulatory referral to Gastroenterology   VAS [place] MESENTERIC DUPLEX     New Prescriptions   PROCHLORPERAZINE (COMPAZINE) 10 MG TABLET    Take 1 tablet (10 mg total) by mouth every 6 (six) hours as needed for nausea or vomiting.   Modified Medications   No medications on file    Planned Follow Up No follow-ups on file.   Total Time in Face-to-Face  and in Coordination of Care for patient including independent/personal interpretation/review of prior testing, medical history, examination, medication adjustment, communicating results with the patient directly, and documentation with the EHR is 35 minutes.   Corliss Parish, MD [facility] Advanced [facility]oscopy Office # [phone]

## 2023-03-18 NOTE — Patient Instructions (Signed)
We have sent the following medications to your pharmacy for you to pick up at your convenience: Compazine   Start Miralax 1 capful in at least 8 ounces of water today and tomorrow. If no large bowel movement then add dulcolax 10 mg.   You will be contacted by [facility] Scheduling in the next 2 days to arrange a CT scan AP and Mesenteric Doppler (for 2nd week in Sept)  The number on your caller ID will be [phone], please answer when they call.  If you have not heard from them in 2 days please call [phone] to schedule.   _______________________________________________________  If your blood pressure at your visit was 140/90 or greater, please contact your primary care physician to follow up on this.  _______________________________________________________  If you are age 23 or older, your body mass index should be between 23-30. Your Body mass index is 19.14 kg/m. If this is out of the aforementioned range listed, please consider follow up with your Primary Care Provider.  If you are age 93 or younger, your body mass index should be between 19-25. Your Body mass index is 19.14 kg/m. If this is out of the aformentioned range listed, please consider follow up with your Primary Care Provider.   ________________________________________________________  The [facility] providers would like to encourage you to use [facility] to communicate with providers for non-urgent requests or questions.  Due to long hold times on the telephone, sending your provider a message by [facility] may be a faster and more efficient way to get a response.  Please allow 48 business hours for a response.  Please remember that this is for non-urgent requests.  _______________________________________________________  Thank you for choosing me and [facility].  Dr. Meridee Score

## 2023-03-19 ENCOUNTER — Other Ambulatory Visit: Payer: Self-pay

## 2023-03-19 ENCOUNTER — Encounter: Payer: Self-pay | Admitting: Gastroenterology

## 2023-03-19 NOTE — Telephone Encounter (Signed)
Thanks Patty.  Rovonda, This is what we were thinking yesterday.  Forwarding to you so that you can make sure that it is appropriately ordered for the 2nd-3rd week of September. Thanks. GM

## 2023-03-22 ENCOUNTER — Encounter: Payer: Self-pay | Admitting: Gastroenterology

## 2023-03-22 DIAGNOSIS — I774 Celiac artery compression syndrome: Secondary | ICD-10-CM | POA: Insufficient documentation

## 2023-03-22 NOTE — Progress Notes (Signed)
03/17/2023 labs from [facility] WBC 9.6 Hemoglobin/hematocrit 11.1/34 Platelets 338 MCV 87 Sodium 138 Potassium 4.6 BUN/creatinine 13/0.45 Calcium 8.8 AST/ALT 16/66 Alkaline phosphatase 57 Total bili 0.2 Phosphorus 3.6 Prealbumin 19 Magnesium 1.8   These results will be scanned into the chart

## 2023-03-26 ENCOUNTER — Encounter (HOSPITAL_COMMUNITY): Payer: Self-pay

## 2023-03-26 ENCOUNTER — Encounter: Payer: Self-pay | Admitting: Gastroenterology

## 2023-03-26 ENCOUNTER — Other Ambulatory Visit: Payer: Self-pay | Admitting: *Deleted

## 2023-03-26 ENCOUNTER — Ambulatory Visit (HOSPITAL_COMMUNITY)
Admission: RE | Admit: 2023-03-26 | Discharge: 2023-03-26 | Disposition: A | Discharge status: Home / Self Care | Payer: No Typology Code available for payment source | Source: Ambulatory Visit | Attending: Gastroenterology | Admitting: Gastroenterology

## 2023-03-26 DIAGNOSIS — R1084 Generalized abdominal pain: Secondary | ICD-10-CM | POA: Diagnosis present

## 2023-03-26 LAB — LAB REPORT - SCANNED: EGFR: 132

## 2023-03-26 MED ORDER — IOHEXOL 300 MG/ML  SOLN
100.0000 mL | Freq: Once | INTRAMUSCULAR | Status: AC | PRN
  Administered 2023-03-26: 100 mL via INTRAVENOUS

## 2023-03-26 MED ORDER — VITAMIN D (ERGOCALCIFEROL) 1.25 MG (50000 UNIT) PO CAPS: 50000.0000 [IU] | ORAL_CAPSULE | ORAL | 0 refills | Status: DC

## 2023-03-26 MED ORDER — IOHEXOL 9 MG/ML PO SOLN: 1000.0000 mL | ORAL | Status: AC

## 2023-03-26 MED ORDER — IOHEXOL 9 MG/ML PO SOLN
ORAL | Status: AC
  Filled 2023-03-26: qty 1000

## 2023-03-26 NOTE — Progress Notes (Signed)
Contacted patient to advise that vitamin D levels are low so we would like to start her on a prescription dose of vitamin D once weekly x 12 weeks. Advised we will recheck levels after the 12 week period to ensure they have normalized. Patient verbalizes understanding. Rx sent to pharmacy.

## 2023-03-26 NOTE — Progress Notes (Signed)
Review of the laboratories from [facility] WBC 6.7 Hemoglobin/hematocrit 20.3/37.4 Platelets 239 with MCV 89 BUN/creatinine 16/12) Sodium 126 Potassium 4.3 Calcium 9.3 Albumin 4.4 Prealbumin 23 AST/ALT 16/11 Alk phos 59 Total bili 0.3 Iron/TIBC 85/338 Iron saturation 25% Ferritin 21 UIBC 253 Folate 14.4 B12 636 Creatinine 0.5 Vitamin D 22.3 (low) Manganese level did not return Selenium 143 Phosphorus 3.9 Triglyceride 61 Magnesium 2.0 Copper 91 Zinc 79   Based on his laboratories, the only additional change would be to initiate the patient on ergocalciferol 50,000 units once weekly for 3 months to try to increase her vitamin D levels. Vitamin D can then be reassessed in 3 months. We will reach out to the patient and let her know.

## 2023-03-31 ENCOUNTER — Encounter: Payer: Self-pay | Admitting: Gastroenterology

## 2023-03-31 DIAGNOSIS — N200 Calculus of kidney: Secondary | ICD-10-CM

## 2023-04-01 ENCOUNTER — Encounter: Payer: Self-pay | Admitting: Gastroenterology

## 2023-04-01 ENCOUNTER — Other Ambulatory Visit (HOSPITAL_COMMUNITY): Payer: No Typology Code available for payment source

## 2023-04-01 LAB — LAB REPORT - SCANNED: EGFR: 132

## 2023-04-01 NOTE — Progress Notes (Signed)
Laboratories were reviewed WBC 6.0 Hemoglobin/hematocrit 11.3/34.6 Platelets 334 MCV 88 Sodium 139 Potassium 4.1 BUN/creatinine 20/0.56 AST/ALT 18/20 Alk phos 54 Total bili 0.2 Albumin 4.2 Calcium 9.3 Phosphorus 3.8 Total cholesterol 184 Triglycerides 67 Prealbumin 27 Magnesium 1.9  These results are available for review on [facility] tab.   Corliss Parish, MD [facility] Advanced [facility]oscopy Office # [phone]

## 2023-04-09 LAB — LAB REPORT - SCANNED: EGFR: 133

## 2023-04-10 ENCOUNTER — Telehealth: Payer: Self-pay | Admitting: Medical-Surgical

## 2023-04-10 NOTE — Telephone Encounter (Signed)
Patient called the office, requesting referral to Cardiology. Patient states she has been having a lot of tachycardia and feels dizzy when standing. Patient also says her heart rate is above 100 when laying down. Please advise.  CB: [phone]

## 2023-04-14 ENCOUNTER — Telehealth: Payer: Self-pay | Admitting: Gastroenterology

## 2023-04-14 ENCOUNTER — Ambulatory Visit (HOSPITAL_COMMUNITY): Admission: RE | Admit: 2023-04-14 | Payer: No Typology Code available for payment source | Source: Ambulatory Visit

## 2023-04-14 ENCOUNTER — Ambulatory Visit (HOSPITAL_COMMUNITY): Payer: No Typology Code available for payment source

## 2023-04-14 NOTE — Telephone Encounter (Signed)
Patient called requesting to speak with a nurse, stated her diagnosis for Picc line is not covered due to the diagnosis. Please advise.

## 2023-04-15 ENCOUNTER — Ambulatory Visit (HOSPITAL_COMMUNITY)
Admission: RE | Admit: 2023-04-15 | Discharge: 2023-04-15 | Disposition: A | Discharge status: Home / Self Care | Payer: No Typology Code available for payment source | Source: Ambulatory Visit | Attending: Gastroenterology | Admitting: Gastroenterology

## 2023-04-15 DIAGNOSIS — I774 Celiac artery compression syndrome: Secondary | ICD-10-CM | POA: Diagnosis present

## 2023-04-15 NOTE — Telephone Encounter (Signed)
See My Chart message dated 9/10

## 2023-04-15 NOTE — Telephone Encounter (Signed)
Rovonda do you get any information or have any information on this pt PICC and labs?  I have not been involved and am not sure how to help her.  I don't mind helping but I do not see where any labs have been ordered weekly.

## 2023-04-16 ENCOUNTER — Ambulatory Visit (INDEPENDENT_AMBULATORY_CARE_PROVIDER_SITE_OTHER): Payer: No Typology Code available for payment source | Admitting: Medical-Surgical

## 2023-04-16 ENCOUNTER — Encounter: Payer: Self-pay | Admitting: Medical-Surgical

## 2023-04-16 VITALS — BP 97/65 | HR 79 | Resp 20 | Ht 69.0 in | Wt 136.0 lb

## 2023-04-16 DIAGNOSIS — R000 Tachycardia, unspecified: Secondary | ICD-10-CM

## 2023-04-16 DIAGNOSIS — Z118 Encounter for screening for other infectious and parasitic diseases: Secondary | ICD-10-CM

## 2023-04-16 DIAGNOSIS — R42 Dizziness and giddiness: Secondary | ICD-10-CM

## 2023-04-16 DIAGNOSIS — F331 Major depressive disorder, recurrent, moderate: Secondary | ICD-10-CM | POA: Diagnosis not present

## 2023-04-16 MED ORDER — SIMETHICONE 125 MG PO CAPS: 125.0000 mg | ORAL_CAPSULE | Freq: Four times a day (QID) | ORAL | 0 refills | Status: DC

## 2023-04-16 NOTE — Progress Notes (Signed)
        Established patient visit  History, exam, impression, and plan:  1. Tachycardia 2. Orthostatic dizziness Jaime Bauer 22 year old female presenting today with reports of intermittent episodes of tachycardia that occur randomly but happen on a daily basis.  She is positive for mid and left chest pain and tightness during these episodes.  She also has shortness of breath however this is random and does not seem to be directly correlated to the tachycardia.  Does have a history of anxiety however she reports that her symptoms began prior to any anxiousness.  Reports this has been going on for quite a while and had a previous referral to cardiology but never got connected with them.  In office EKG today showing normal sinus rhythm with a rate of 82, normal axis, no red flags.  Checking orthostatic vital signs today as she reports that she does have some orthostasis.  Recent labs checked so no need to update that today.  Long-term monitor completed in 2023 showed rare PVCs and mild episodes of tachycardia.  There were no concerning heart rhythms at that time.  Given the persistent nature of this and the accompanying chest pain, referring to cardiology for further evaluation. - Ambulatory referral to Cardiology - EKG 12-Lead - Orthostatic vital signs  3. Moderate episode of recurrent major depressive disorder [facility]) Managed by psychology and psychiatry.   Procedures performed this visit: None.  Return in about 1 year (around 04/15/2024) for annual physical exam.  __________________________________ Thayer Ohm, DNP, APRN, FNP-BC Primary Care and Sports Medicine [facility]

## 2023-04-21 ENCOUNTER — Encounter: Payer: Self-pay | Admitting: Gastroenterology

## 2023-04-21 NOTE — Telephone Encounter (Signed)
Called patient and unable to contact. I left a message for the patient to return my call.

## 2023-04-21 NOTE — Telephone Encounter (Signed)
See if patient could trial Benefiber wafers daily or Fibercon daily. If not helping then try 2 mg Imodium. Her insides are quite tenuous for her to go Constipation/Diarrhea and vice versa. She could trial Investment banker, operational as well (though probiotics have only anecdotal evidence of helping bowel habits). GM

## 2023-04-22 NOTE — Telephone Encounter (Signed)
Patient returned your call. Please advise.

## 2023-04-23 ENCOUNTER — Ambulatory Visit: Payer: No Typology Code available for payment source | Admitting: Gastroenterology

## 2023-04-23 LAB — LAB REPORT - SCANNED: EGFR: 132

## 2023-04-23 NOTE — Telephone Encounter (Signed)
Patient called and notified of Dr. Elesa Hacker recommendations for complaints of diarrhea. Patient is agreeable.

## 2023-05-23 ENCOUNTER — Other Ambulatory Visit (HOSPITAL_COMMUNITY): Payer: Self-pay | Admitting: Radiology

## 2023-05-23 ENCOUNTER — Encounter (HOSPITAL_COMMUNITY): Payer: Self-pay | Admitting: Radiology

## 2023-05-23 ENCOUNTER — Ambulatory Visit (HOSPITAL_COMMUNITY)
Admission: RE | Admit: 2023-05-23 | Discharge: 2023-05-23 | Disposition: A | Discharge status: Home / Self Care | Payer: No Typology Code available for payment source | Source: Ambulatory Visit | Attending: Radiology | Admitting: Radiology

## 2023-05-23 DIAGNOSIS — E43 Unspecified severe protein-calorie malnutrition: Secondary | ICD-10-CM

## 2023-05-23 HISTORY — PX: IR PATIENT EVAL TECH 0-60 MINS: IMG5564

## 2023-05-23 NOTE — Procedures (Signed)
Patient came in today unable to exchange her extension tubing of her PICC line previously  placed by IR. The extension tubing was removed by gentle rotation with hemostats.  A new extension line (provided by the patient) was placed on the PICC.  The PICC gave blood turn and it was flushed with NSS.

## 2023-06-02 ENCOUNTER — Encounter: Payer: Self-pay | Admitting: Gastroenterology

## 2023-06-02 DIAGNOSIS — R1084 Generalized abdominal pain: Secondary | ICD-10-CM

## 2023-06-02 DIAGNOSIS — F509 Eating disorder, unspecified: Secondary | ICD-10-CM

## 2023-06-02 DIAGNOSIS — R110 Nausea: Secondary | ICD-10-CM

## 2023-06-02 DIAGNOSIS — R634 Abnormal weight loss: Secondary | ICD-10-CM

## 2023-06-02 DIAGNOSIS — R630 Anorexia: Secondary | ICD-10-CM

## 2023-06-02 DIAGNOSIS — Z862 Personal history of diseases of the blood and blood-forming organs and certain disorders involving the immune mechanism: Secondary | ICD-10-CM

## 2023-06-03 MED ORDER — FERROUS GLUCONATE 324 (38 FE) MG PO TABS: 324.0000 mg | ORAL_TABLET | Freq: Every day | ORAL | 0 refills | Status: DC

## 2023-06-03 MED ORDER — OMEPRAZOLE 40 MG PO CPDR: 40.0000 mg | DELAYED_RELEASE_CAPSULE | Freq: Every day | ORAL | 3 refills | Status: DC

## 2023-06-03 NOTE — Addendum Note (Signed)
Addended by: Loretha Stapler on: 06/03/2023 01:20 PM   Modules accepted: Orders

## 2023-06-03 NOTE — Addendum Note (Signed)
Addended by: Loretha Stapler on: 06/03/2023 04:32 PM   Modules accepted: Orders

## 2023-06-03 NOTE — Telephone Encounter (Signed)
Review of laboratories from 10/25 reported on 10/28  WBC 4.7 Hemoglobin/hematocrit 10.1/31.6 Platelets 270 BUN/creatinine 22/0.57 Sodium 138 Potassium 4.3 AST/ALT 20/33 Iron/TIBC 31/397 Iron saturation 8% Ferritin 5 Transferrin 329 UIBC 366 Folate 1101 B12 736 Folate 16.9 Vitamin D 23.8 Phosphorus 3.9 Triglycerides 26 Total cholesterol 169  In the setting of the patient's reported dark in stool, I recommend initiation of a PPI 40 mg once daily. She needs an upper endoscopy next available with me. Patient needs repeat CBC and BUN/creatinine this week (preferably by Thursday with a stat follow-up). She needs a repeat vitamin D level to be checked in 2 months and if she still has evidence of iron deficiency, she will require referral back to her PCP to discuss further treatment options. Will need to find out what her iron supplementation is otherwise we need to initiate her on intravenous iron infusions to try to bump her stores with her persisting significant iron deficiency. Ask her if she is still having menstrual periods. She needs to be set up for a follow-up in clinic with me or one of the APP's. Ask her how her weight is doing and what her oral intake is at this point. GM

## 2023-06-03 NOTE — Telephone Encounter (Signed)
Start her on Ferrous Gluconate 324 mg daily. Repeat Iron/TIBC/Ferritin in 14-month. If still low, then will need IV Iron infusions.

## 2023-06-04 ENCOUNTER — Ambulatory Visit (AMBULATORY_SURGERY_CENTER): Payer: No Typology Code available for payment source

## 2023-06-04 ENCOUNTER — Other Ambulatory Visit: Payer: Self-pay

## 2023-06-04 ENCOUNTER — Other Ambulatory Visit: Payer: No Typology Code available for payment source

## 2023-06-04 VITALS — Ht 69.0 in | Wt 140.0 lb

## 2023-06-04 DIAGNOSIS — R110 Nausea: Secondary | ICD-10-CM

## 2023-06-04 DIAGNOSIS — R1084 Generalized abdominal pain: Secondary | ICD-10-CM

## 2023-06-04 DIAGNOSIS — R112 Nausea with vomiting, unspecified: Secondary | ICD-10-CM

## 2023-06-04 DIAGNOSIS — R634 Abnormal weight loss: Secondary | ICD-10-CM | POA: Diagnosis not present

## 2023-06-04 DIAGNOSIS — G8929 Other chronic pain: Secondary | ICD-10-CM

## 2023-06-04 DIAGNOSIS — Z862 Personal history of diseases of the blood and blood-forming organs and certain disorders involving the immune mechanism: Secondary | ICD-10-CM

## 2023-06-04 DIAGNOSIS — F509 Eating disorder, unspecified: Secondary | ICD-10-CM

## 2023-06-04 DIAGNOSIS — R630 Anorexia: Secondary | ICD-10-CM | POA: Diagnosis not present

## 2023-06-04 DIAGNOSIS — R1013 Epigastric pain: Secondary | ICD-10-CM

## 2023-06-04 LAB — IBC + FERRITIN
Ferritin: 1.9 ng/mL — ABNORMAL LOW (ref 10.0–291.0)
Iron: 27 ug/dL — ABNORMAL LOW (ref 42–145)
Saturation Ratios: 5.5 % — ABNORMAL LOW (ref 20.0–50.0)
TIBC: 487.2 ug/dL — ABNORMAL HIGH (ref 250.0–450.0)
Transferrin: 348 mg/dL (ref 212.0–360.0)

## 2023-06-04 LAB — CBC WITH DIFFERENTIAL/PLATELET
Basophils Absolute: 0 10*3/uL (ref 0.0–0.1)
Basophils Relative: 0.7 % (ref 0.0–3.0)
Eosinophils Absolute: 0.2 10*3/uL (ref 0.0–0.7)
Eosinophils Relative: 2.8 % (ref 0.0–5.0)
HCT: 32.6 % — ABNORMAL LOW (ref 36.0–46.0)
Hemoglobin: 10.5 g/dL — ABNORMAL LOW (ref 12.0–15.0)
Lymphocytes Relative: 31.3 % (ref 12.0–46.0)
Lymphs Abs: 1.7 10*3/uL (ref 0.7–4.0)
MCHC: 32.3 g/dL (ref 30.0–36.0)
MCV: 84.7 fL (ref 78.0–100.0)
Monocytes Absolute: 0.6 10*3/uL (ref 0.1–1.0)
Monocytes Relative: 9.9 % (ref 3.0–12.0)
Neutro Abs: 3.1 10*3/uL (ref 1.4–7.7)
Neutrophils Relative %: 55.3 % (ref 43.0–77.0)
Platelets: 282 10*3/uL (ref 150.0–400.0)
RBC: 3.85 Mil/uL — ABNORMAL LOW (ref 3.87–5.11)
RDW: 13.1 % (ref 11.5–15.5)
WBC: 5.6 10*3/uL (ref 4.0–10.5)

## 2023-06-04 LAB — CREATININE, SERUM: Creatinine, Ser: 0.59 mg/dL (ref 0.40–1.20)

## 2023-06-04 LAB — BUN: BUN: 24 mg/dL — ABNORMAL HIGH (ref 6–23)

## 2023-06-04 LAB — VITAMIN D 25 HYDROXY (VIT D DEFICIENCY, FRACTURES): VITD: 25.62 ng/mL — ABNORMAL LOW (ref 30.00–100.00)

## 2023-06-04 NOTE — Progress Notes (Signed)
No egg or soy allergy known to patient  No issues known to pt with past sedation with any surgeries or procedures Patient denies ever being told they had issues or difficulty with intubation  No FH of Malignant Hyperthermia Pt is not on diet pills Pt is not on  home 02  Pt is not on blood thinners  Pt states she fluctuates b/w diarrhea and constipation  No A fib or A flutter Have any cardiac testing pending--no Pt instructed to use Singlecare.com or GoodRx for a price reduction on prep  Ambulates independently

## 2023-06-10 ENCOUNTER — Encounter: Payer: Self-pay | Admitting: Pulmonary Disease

## 2023-06-10 ENCOUNTER — Other Ambulatory Visit: Payer: Self-pay

## 2023-06-10 ENCOUNTER — Telehealth: Payer: Self-pay | Admitting: Pharmacy Technician

## 2023-06-10 DIAGNOSIS — D509 Iron deficiency anemia, unspecified: Secondary | ICD-10-CM | POA: Insufficient documentation

## 2023-06-10 DIAGNOSIS — Z862 Personal history of diseases of the blood and blood-forming organs and certain disorders involving the immune mechanism: Secondary | ICD-10-CM

## 2023-06-10 NOTE — Telephone Encounter (Signed)
Thank you so much

## 2023-06-10 NOTE — Addendum Note (Signed)
Addended by: Loretha Stapler on: 06/10/2023 09:29 AM   Modules accepted: Orders

## 2023-06-10 NOTE — Telephone Encounter (Signed)
Jaime Bauer, Fyi note:  Patient will be scheduled as soon as possible.  Auth Submission: NO AUTH NEEDED Site of care: Site of care: [facility] Payer: [employer] [place] Medication & CPT/J Code(s) submitted: Feraheme (ferumoxytol) [id number] Route of submission (phone, fax, portal):  Phone # Fax # Auth type: Buy/Bill PB Units/visits requested: 2 Reference number:  Approval from: 05/10/23 to 08/05/23

## 2023-06-11 ENCOUNTER — Encounter: Payer: Self-pay | Admitting: Gastroenterology

## 2023-06-15 ENCOUNTER — Other Ambulatory Visit: Payer: Self-pay | Admitting: Gastroenterology

## 2023-06-16 ENCOUNTER — Other Ambulatory Visit (INDEPENDENT_AMBULATORY_CARE_PROVIDER_SITE_OTHER): Payer: No Typology Code available for payment source

## 2023-06-16 DIAGNOSIS — R634 Abnormal weight loss: Secondary | ICD-10-CM | POA: Diagnosis not present

## 2023-06-16 DIAGNOSIS — R112 Nausea with vomiting, unspecified: Secondary | ICD-10-CM

## 2023-06-16 DIAGNOSIS — R1013 Epigastric pain: Secondary | ICD-10-CM | POA: Diagnosis not present

## 2023-06-16 DIAGNOSIS — Z862 Personal history of diseases of the blood and blood-forming organs and certain disorders involving the immune mechanism: Secondary | ICD-10-CM

## 2023-06-16 DIAGNOSIS — G8929 Other chronic pain: Secondary | ICD-10-CM

## 2023-06-16 DIAGNOSIS — R110 Nausea: Secondary | ICD-10-CM | POA: Diagnosis not present

## 2023-06-16 LAB — CBC WITH DIFFERENTIAL/PLATELET
Basophils Absolute: 0 10*3/uL (ref 0.0–0.1)
Basophils Relative: 0.6 % (ref 0.0–3.0)
Eosinophils Absolute: 0.1 10*3/uL (ref 0.0–0.7)
Eosinophils Relative: 2.2 % (ref 0.0–5.0)
HCT: 32.1 % — ABNORMAL LOW (ref 36.0–46.0)
Hemoglobin: 10.7 g/dL — ABNORMAL LOW (ref 12.0–15.0)
Lymphocytes Relative: 29.1 % (ref 12.0–46.0)
Lymphs Abs: 1.7 10*3/uL (ref 0.7–4.0)
MCHC: 33.3 g/dL (ref 30.0–36.0)
MCV: 83.6 fL (ref 78.0–100.0)
Monocytes Absolute: 0.5 10*3/uL (ref 0.1–1.0)
Monocytes Relative: 8.2 % (ref 3.0–12.0)
Neutro Abs: 3.6 10*3/uL (ref 1.4–7.7)
Neutrophils Relative %: 59.9 % (ref 43.0–77.0)
Platelets: 275 10*3/uL (ref 150.0–400.0)
RBC: 3.84 Mil/uL — ABNORMAL LOW (ref 3.87–5.11)
RDW: 13.1 % (ref 11.5–15.5)
WBC: 6 10*3/uL (ref 4.0–10.5)

## 2023-06-16 LAB — BUN: BUN: 22 mg/dL (ref 6–23)

## 2023-06-16 LAB — CREATININE, SERUM: Creatinine, Ser: 0.67 mg/dL (ref 0.40–1.20)

## 2023-06-18 ENCOUNTER — Encounter: Payer: Self-pay | Admitting: Gastroenterology

## 2023-06-18 ENCOUNTER — Ambulatory Visit: Payer: No Typology Code available for payment source | Admitting: Gastroenterology

## 2023-06-18 ENCOUNTER — Other Ambulatory Visit: Payer: Self-pay | Admitting: *Deleted

## 2023-06-18 VITALS — BP 96/59 | HR 80 | Temp 99.1°F | Resp 11 | Ht 69.0 in | Wt 140.0 lb

## 2023-06-18 DIAGNOSIS — K529 Noninfective gastroenteritis and colitis, unspecified: Secondary | ICD-10-CM

## 2023-06-18 DIAGNOSIS — K229 Disease of esophagus, unspecified: Secondary | ICD-10-CM

## 2023-06-18 DIAGNOSIS — K2950 Unspecified chronic gastritis without bleeding: Secondary | ICD-10-CM

## 2023-06-18 DIAGNOSIS — K2970 Gastritis, unspecified, without bleeding: Secondary | ICD-10-CM

## 2023-06-18 DIAGNOSIS — I774 Celiac artery compression syndrome: Secondary | ICD-10-CM | POA: Diagnosis not present

## 2023-06-18 MED ORDER — SODIUM CHLORIDE 0.9 % IV SOLN: 500.0000 mL | Freq: Once | INTRAVENOUS | Status: DC

## 2023-06-18 NOTE — Progress Notes (Signed)
Pt's states no medical or surgical changes since previsit or office visit. 

## 2023-06-18 NOTE — Op Note (Signed)
[facility] Patient Name: Jaime Bauer Procedure Date: 06/18/2023 9:35 AM MRN: [id number] Endoscopist: Corliss Parish , MD, [id number] Age: 22 Referring MD:  Date of Birth: 02-Dec-2000 Gender: Female Account #: [id number] Procedure:                Upper GI endoscopy Indications:              Generalized abdominal pain, Iron deficiency anemia,                            Melena, Suspected upper gastrointestinal bleeding Medicines:                Monitored Anesthesia Care Procedure:                Pre-Anesthesia Assessment:                           - Prior to the procedure, a History and Physical                            was performed, and patient medications and                            allergies were reviewed. The patient's tolerance of                            previous anesthesia was also reviewed. The risks                            and benefits of the procedure and the sedation                            options and risks were discussed with the patient.                            All questions were answered, and informed consent                            was obtained. Prior Anticoagulants: The patient has                            taken no anticoagulant or antiplatelet agents. ASA                            Grade Assessment: II - A patient with mild systemic                            disease. After reviewing the risks and benefits,                            the patient was deemed in satisfactory condition to                            undergo the procedure.  After obtaining informed consent, the endoscope was                            passed under direct vision. Throughout the                            procedure, the patient's blood pressure, pulse, and                            oxygen saturations were monitored continuously. The                            GIF [id number] #[id number] was introduced through the                             mouth, and advanced to the second part of duodenum.                            The upper GI endoscopy was accomplished without                            difficulty. The patient tolerated the procedure. Scope In: Scope Out: Findings:                 No gross lesions were noted in the entire esophagus.                           The Z-line was irregular and was found 35 cm from                            the incisors.                           A 1 cm hiatal hernia was present.                           Striped mildly erythematous mucosa without bleeding                            was found in the entire examined stomach. Biopsies                            were taken with a cold forceps for histology and                            Helicobacter pylori testing.                           No gross lesions were noted in the duodenal bulb,                            in the first portion of the duodenum and in the  second portion of the duodenum. Biopsies were taken                            with a cold forceps for histology. Complications:            No immediate complications. Estimated Blood Loss:     Estimated blood loss was minimal. Impression:               - No gross lesions in the entire esophagus.                           - Z-line irregular, 35 cm from the incisors.                           - 1 cm hiatal hernia.                           - Erythematous mucosa in the stomach. Biopsied.                           - No gross lesions in the duodenal bulb, in the                            first portion of the duodenum and in the second                            portion of the duodenum. Biopsied. Recommendation:           - The patient will be observed post-procedure,                            until all discharge criteria are met.                           - Discharge patient to home.                           - Patient has a contact number available for                             emergencies. The signs and symptoms of potential                            delayed complications were discussed with the                            patient. Return to normal activities tomorrow.                            Written discharge instructions were provided to the                            patient.                           - Resume  previous diet.                           - Continue present medications.                           - Await pathology results.                           - Consider PPI if significant gastritis is noted on                            final pathology.                           - Observe patient's clinical course.                           - Will plan some stool culture data, since patient                            is still having diarrheal movements (much less                            darker stool however 1x per week). These orders                            placed.                           - Patient may trial Imodium 4 mg in the AM and can                            use up to 10 mg daily if needed. Her body can go                            from one extreme to the other as we have seen in                            past, so moderate use. If stool culture data                            negative, Imodium not helpful, we can get Lomotil                            to be used.                           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                           - Can consider  VCE if IDA persists after Iron                            supplementation and if she is not having periods                            moving forward.                           - The findings and recommendations were discussed                            with the patient.                           - The findings and recommendations were discussed                             with the designated responsible adult. Corliss Parish, MD 06/18/2023 10:07:14 AM

## 2023-06-18 NOTE — Progress Notes (Signed)
Vss nad trans to pacu 

## 2023-06-18 NOTE — Progress Notes (Signed)
GASTROENTEROLOGY PROCEDURE H&P NOTE   Primary Care Physician: Christen Butter, NP  HPI: Jaime Bauer is a 22 y.o. female who presents for EGD for evaluation of dark stools and elevated Bun concern for possible UGI bleeding, chronic abdominal pain.  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     levonorgestrel (KYLEENA) 19.5 MG IUD 1 each by Intrauterine route once.     prochlorperazine (COMPAZINE) 10 MG tablet Take 1 tablet (10 mg total) by mouth every 6 (six) hours as needed for nausea or vomiting. 30 tablet 3   Simethicone 125 MG CAPS Take 1 capsule (125 mg total) by mouth in the morning, at noon, in the evening, and at bedtime. 120 capsule 0   Vitamin D, Ergocalciferol, (DRISDOL) 1.25 MG (50000 UNIT) CAPS capsule TAKE 1 CAPSULE BY MOUTH EVERY 7 DAYS FOR 12 WEEKS 12 capsule 0   AMBULATORY NON FORMULARY MEDICATION TPN per [employer] protocol for labs and dosing 1 each 0   ferrous gluconate (FERGON) 324 MG tablet Take 1 tablet (324 mg total) by mouth daily with breakfast. 30 tablet 0   omeprazole (PRILOSEC) 40 MG capsule Take 1 capsule (40 mg total) by mouth daily. (Patient not taking: Reported on 06/04/2023) 30 capsule 3   Current Facility-Administered Medications  Medication Dose Route Frequency Provider Last Rate Last Admin   0.9 %  sodium chloride infusion  500 mL Intravenous Once Mansouraty, Netty Starring., MD         Current Outpatient Medications:    FLUoxetine (PROZAC) 20 MG capsule, Take 80 mg by mouth every morning., Disp: , Rfl:    levonorgestrel (KYLEENA) 19.5 MG IUD, 1 each by Intrauterine route once., Disp: , Rfl:    prochlorperazine (COMPAZINE) 10 MG tablet, Take 1 tablet (10 mg total) by mouth every 6 (six) hours as needed for nausea or vomiting., Disp: 30 tablet, Rfl: 3   Simethicone 125 MG CAPS, Take 1 capsule (125 mg total) by mouth in the morning, at noon, in the evening, and at bedtime., Disp: 120 capsule, Rfl: 0   Vitamin D, Ergocalciferol, (DRISDOL) 1.25 MG (50000 UNIT) CAPS capsule, TAKE 1 CAPSULE BY MOUTH EVERY 7 DAYS FOR 12 WEEKS, Disp: 12 capsule, Rfl: 0   AMBULATORY NON FORMULARY MEDICATION, TPN per [employer] protocol for labs and dosing, Disp: 1 each, Rfl: 0   ferrous gluconate (FERGON) 324 MG tablet, Take 1 tablet (324 mg total) by mouth daily with breakfast., Disp: 30 tablet, Rfl: 0   omeprazole (PRILOSEC) 40 MG capsule, Take 1 capsule (40 mg total) by mouth daily. (Patient not taking: Reported on 06/04/2023), Disp: 30 capsule, Rfl: 3  Current Facility-Administered Medications:    0.9 %  sodium chloride infusion, 500 mL, Intravenous, Once, Mansouraty, Netty Starring., MD No Known Allergies Family History  Problem Relation Age of  Onset   Skin cancer Mother    Colon polyps Mother    Cancer Father    Colon cancer Father 14   Colon polyps Father    Rectal cancer Father    Hypertension Paternal Uncle    Cancer Maternal Grandmother    Stroke Maternal Grandfather    GER disease Other    Esophageal cancer Neg Hx    Inflammatory bowel disease Neg Hx    Liver disease Neg Hx    Pancreatic cancer Neg Hx    Stomach cancer Neg Hx    Social History   Socioeconomic History   Marital status: Single    Spouse name: Not on file   Number of children: 0   Years of education: 14   Highest education level: Not on file  Occupational History   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  Other Topics Concern   Not on file  Social History Narrative   Right handed   Drinks caffeine   Two story home   Social Determinants of Health   Financial Resource Strain: Not on file  Food Insecurity: No Food Insecurity (03/12/2023)   Received from [facility]   Hunger Vital Sign    Worried About Running Out of Food in the Last Year: Never true    Ran Out of Food in the Last Year: Never true  Transportation Needs: No Transportation Needs (03/12/2023)   Received from [facility] - Transportation    Lack of Transportation (Medical): No    Lack of Transportation (Non-Medical): No  Physical Activity: Inactive (11/08/2022)   Received from [facility], [facility]   Exercise Vital Sign    Days of Exercise per Week: 0 days    Minutes of Exercise per Session: 0 min  Stress: Not on file  Social Connections: Unknown (05/13/2022)   Received from [facility], [facility]   Social Network    Social Network: Not on file  Intimate Partner Violence: Patient Unable To Answer (03/12/2023)   Received from [facility]   Humiliation, Afraid, Rape, and Kick questionnaire    Fear of Current or Ex-Partner: Patient unable to answer    Emotionally Abused: Patient unable to answer    Physically Abused: Patient unable to answer    Sexually Abused: Patient unable to answer    Physical Exam: Today's Vitals   06/18/23 0945 06/18/23 0950 06/18/23 0955 06/18/23 0958  BP: 112/80 122/80 117/76 (!) 97/53  Pulse: 97 (!) 109 (!) 105 100  Resp: 12 10 11 15   Temp:      TempSrc:      SpO2: 100% 95% 98% 96%  Weight:      Height:      PainSc:       Body mass index is 20.67 kg/m. GEN: NAD EYE: Sclerae anicteric ENT: MMM CV: Non-tachycardic GI: Soft, NT/ND NEURO:  Alert &  Oriented x 3  Lab Results: Recent Labs    06/16/23 1015  WBC 6.0  HGB 10.7*  HCT 32.1*  PLT 275.0   BMET Recent Labs    06/16/23 1015  BUN 22  CREATININE 0.67   LFT No results for input(s): "PROT", "ALBUMIN", "AST", "ALT", "ALKPHOS", "BILITOT", "BILIDIR", "IBILI" in  the last 72 hours. PT/INR No results for input(s): "LABPROT", "INR" in the last 72 hours.   Impression / Plan: This is a 22 y.o.female who presents for EGD for evaluation of dark stools and elevated Bun concern for possible UGI bleeding, chronic abdominal pain.  The risks and benefits of endoscopic evaluation/treatment were discussed with the patient and/or family; these include but are not limited to the risk of perforation, infection, bleeding, missed lesions, lack of diagnosis, severe illness requiring hospitalization, as well as anesthesia and sedation related illnesses.  The patient's history has been reviewed, patient examined, no change in status, and deemed stable for procedure.  The patient and/or family is agreeable to proceed.    Corliss Parish, MD [facility] Advanced Endoscopy Office # [phone]

## 2023-06-18 NOTE — Patient Instructions (Signed)
Discharge instructions given Biopsies taken. Will send to lab. Resume previous medications. See report fro recommendations. YOU HAD AN ENDOSCOPIC PROCEDURE TODAY AT THE [facility]:   Refer to the procedure report that was given to you for any specific questions about what was found during the examination.  If the procedure report does not answer your questions, please call your gastroenterologist to clarify.  If you requested that your care partner not be given the details of your procedure findings, then the procedure report has been included in a sealed envelope for you to review at your convenience later.  YOU SHOULD EXPECT: Some feelings of bloating in the abdomen. Passage of more gas than usual.  Walking can help get rid of the air that was put into your GI tract during the procedure and reduce the bloating. If you had a lower endoscopy (such as a colonoscopy or flexible sigmoidoscopy) you may notice spotting of blood in your stool or on the toilet paper. If you underwent a bowel prep for your procedure, you may not have a normal bowel movement for a few days.  Please Note:  You might notice some irritation and congestion in your nose or some drainage.  This is from the oxygen used during your procedure.  There is no need for concern and it should clear up in a day or so.  SYMPTOMS TO REPORT IMMEDIATELY:   Following upper endoscopy (EGD)  Vomiting of blood or coffee ground material  New chest pain or pain under the shoulder blades  Painful or persistently difficult swallowing  New shortness of breath  Fever of 100F or higher  Black, tarry-looking stools  For urgent or emergent issues, a gastroenterologist can be reached at any hour by calling [phone]. Do not use MyChart messaging for urgent concerns.    DIET:  We do recommend a small meal at first, but then you may proceed to your regular diet.  Drink plenty of fluids but you should avoid alcoholic beverages for 24  hours.  ACTIVITY:  You should plan to take it easy for the rest of today and you should NOT DRIVE or use heavy machinery until tomorrow (because of the sedation medicines used during the test).    FOLLOW UP: Our staff will call the number listed on your records the next business day following your procedure.  We will call around 7:15- 8:00 am to check on you and address any questions or concerns that you may have regarding the information given to you following your procedure. If we do not reach you, we will leave a message.     If any biopsies were taken you will be contacted by phone or by letter within the next 1-3 weeks.  Please call us at [phone] if you have not heard about the biopsies in 3 weeks.    SIGNATURES/CONFIDENTIALITY: You and/or your care partner have signed paperwork which will be entered into your electronic medical record.  These signatures attest to the fact that that the information above on your After Visit Summary has been reviewed and is understood.  Full responsibility of the confidentiality of this discharge information lies with you and/or your care-partner.

## 2023-06-18 NOTE — Progress Notes (Signed)
Called to room to assist during endoscopic procedure.  Patient ID and intended procedure confirmed with present staff. Received instructions for my participation in the procedure from the performing physician.  

## 2023-06-19 ENCOUNTER — Telehealth: Payer: Self-pay

## 2023-06-19 ENCOUNTER — Ambulatory Visit: Payer: No Typology Code available for payment source

## 2023-06-19 NOTE — Telephone Encounter (Signed)
Attempted f/u call. No answer, left VM. 

## 2023-06-20 ENCOUNTER — Ambulatory Visit (INDEPENDENT_AMBULATORY_CARE_PROVIDER_SITE_OTHER): Payer: No Typology Code available for payment source

## 2023-06-20 VITALS — BP 112/75 | HR 74 | Temp 98.4°F | Resp 14

## 2023-06-20 DIAGNOSIS — D509 Iron deficiency anemia, unspecified: Secondary | ICD-10-CM | POA: Diagnosis not present

## 2023-06-20 LAB — SURGICAL PATHOLOGY

## 2023-06-20 MED ORDER — SODIUM CHLORIDE 0.9 % IV SOLN
510.0000 mg | Freq: Once | INTRAVENOUS | Status: AC
  Administered 2023-06-20: 510 mg via INTRAVENOUS
  Filled 2023-06-20: qty 17

## 2023-06-20 MED ORDER — ACETAMINOPHEN 325 MG PO TABS: 650.0000 mg | ORAL_TABLET | Freq: Once | ORAL | Status: DC

## 2023-06-20 MED ORDER — DIPHENHYDRAMINE HCL 25 MG PO CAPS
25.0000 mg | ORAL_CAPSULE | Freq: Once | ORAL | Status: AC
  Administered 2023-06-20: 25 mg via ORAL
  Filled 2023-06-20: qty 1

## 2023-06-20 NOTE — Progress Notes (Signed)
Diagnosis: Iron Deficiency Anemia  Provider:  Chilton Greathouse MD  Procedure: IV Infusion  IV Type: PICC, IV Location: R Upper Arm  Feraheme (Ferumoxytol), Dose: 510 mg  Infusion Start Time: 1344  Infusion Stop Time: 1408  Post Infusion IV Care: Observation period completed and PICC Line Flushed/Capped  Discharge: Condition: Good, Destination: Home . AVS Declined  Performed by:  Loney Hering, LPN

## 2023-06-23 ENCOUNTER — Encounter: Payer: Self-pay | Admitting: Gastroenterology

## 2023-06-23 ENCOUNTER — Telehealth: Payer: Self-pay | Admitting: Gastroenterology

## 2023-06-23 NOTE — Telephone Encounter (Signed)
Inbound call from patient, states she met with her gastroenterologist at [facility] and they are wanting to place a "G Tube". Patient wanted to advise Dr. Meridee Score.

## 2023-06-23 NOTE — Telephone Encounter (Addendum)
Once we can see documentation of the notation in either care everywhere or separate faxed note, we can decide how we approach potential placement of a G-tube. Versus when she heads back home in December for winter break, she could have a G-tube placed while she is in town there, since they are the ones recommending. Lets see the notes. Thanks. GM

## 2023-06-24 NOTE — Telephone Encounter (Signed)
Note has been sent to the pt via My Chart with instructions from Dr Meridee Score  She does view messages

## 2023-06-24 NOTE — Telephone Encounter (Signed)
Patty, Please let patient know, we should just make sure that the patient's surgeons at [facility], can document that they are okay if a PEG tube is placed, since they were planning potential other interventions and were still having discussions.  I do not want to have a PEG tube placed and then they have issues from a surgical standpoint, in case something else is being offered. Thus, we need to see documentation in the patient's chart that says okay for PEG tube placement from [facility] surgeons.  She can reach out to them to confirm and send this question as well. Thanks. GM

## 2023-06-25 ENCOUNTER — Encounter: Payer: Self-pay | Admitting: Gastroenterology

## 2023-06-26 ENCOUNTER — Encounter: Payer: Self-pay | Admitting: Internal Medicine

## 2023-06-26 ENCOUNTER — Ambulatory Visit (INDEPENDENT_AMBULATORY_CARE_PROVIDER_SITE_OTHER): Payer: No Typology Code available for payment source

## 2023-06-26 ENCOUNTER — Ambulatory Visit: Payer: No Typology Code available for payment source | Attending: Internal Medicine | Admitting: Internal Medicine

## 2023-06-26 ENCOUNTER — Ambulatory Visit: Payer: No Typology Code available for payment source

## 2023-06-26 VITALS — BP 112/70 | HR 75 | Ht 69.0 in | Wt 149.4 lb

## 2023-06-26 DIAGNOSIS — D509 Iron deficiency anemia, unspecified: Secondary | ICD-10-CM

## 2023-06-26 DIAGNOSIS — R42 Dizziness and giddiness: Secondary | ICD-10-CM | POA: Diagnosis not present

## 2023-06-26 DIAGNOSIS — R000 Tachycardia, unspecified: Secondary | ICD-10-CM

## 2023-06-26 DIAGNOSIS — R002 Palpitations: Secondary | ICD-10-CM | POA: Diagnosis not present

## 2023-06-26 NOTE — Patient Instructions (Signed)
Medication Instructions:  Your physician recommends that you continue on your current medications as directed. Please refer to the Current Medication list given to you today.  *If you need a refill on your cardiac medications before your next appointment, please call your pharmacy*  Lab Work: If you have labs (blood work) drawn today and your tests are completely normal, you will receive your results only by: MyChart Message (if you have MyChart) OR A paper copy in the mail If you have any lab test that is abnormal or we need to change your treatment, we will call you to review the results.  Testing/Procedures: Your physician has recommended that you wear a 2 week monitor. Event monitors are medical devices that record the heart's electrical activity. Doctors most often [place] these monitors to diagnose arrhythmias. Arrhythmias are problems with the speed or rhythm of the heartbeat. The monitor is a small, portable device. You can wear one while you do your normal daily activities. This is usually used to diagnose what is causing palpitations/syncope (passing out).  Follow-Up: At [facility], you and your health needs are our priority.  As part of our continuing mission to provide you with exceptional heart care, we have created designated Provider Care Teams.  These Care Teams include your primary Cardiologist (physician) and Advanced Practice Providers (APPs -  Physician Assistants and Nurse Practitioners) who all work together to provide you with the care you need, when you need it.  We recommend signing up for the patient portal called "MyChart".  Sign up information is provided on this After Visit Summary.  MyChart is used to connect with patients for Virtual Visits (Telemedicine).  Patients are able to view lab/test results, encounter notes, upcoming appointments, etc.  Non-urgent messages can be sent to your provider as well.   To learn more about what you can do with MyChart, go to  [email or web address].    Your next appointment:   3 month(s)  Provider:   Jari Favre, PA-C, Ronie Spies, PA-C, Robin Searing, NP, Jacolyn Reedy, PA-C, Eligha Bridegroom, NP, Tereso Newcomer, PA-C, Perlie Gold, PA-C, or Cyndi Bender, NP     Then, Dr. Izora Ribas will plan to see you again in as   needed if test look fine and no new symptoms .

## 2023-06-26 NOTE — Progress Notes (Unsigned)
Enrolled patient for a 14 day Zio XT  monitor to be mailed to patients home  °

## 2023-06-26 NOTE — Progress Notes (Signed)
Cardiology Office Note:  .    Date:  06/26/2023  ID:  Jaime Bauer, DOB 10-21-2000, MRN [id number] PCP: Christen Butter, NP  [facility] Providers Cardiologist:  Christell Constant, MD     CC: tachycardia Consulted for the evaluation of tachycardia at the behest of Ms. Larinda Buttery  History of Present Illness: Jaime Bauer is a 22 y.o. female with new tachycardia in the setting of GI illness and IDA.  Jaime Bauer, a 22 year old with a complex medical history, presents with a chief complaint of intermittent episodes of tachycardia, palpitations, and associated symptoms. These episodes occur randomly, irrespective of the patient's position, and are characterized by a racing heart, a peculiar sensation in the chest, difficulty breathing, lightheadedness, and a feeling of being overwhelmed. The patient reports that these symptoms have been ongoing for a significant period, with a Holter monitor test conducted in 2022 indicating random bouts of tachycardia.  In addition to these cardiac symptoms, the patient has been dealing with multiple other medical issues. She underwent surgery in July.. For approximately five years, she has been experiencing unresolved digestive and intestinal issues, which have significantly impacted her nutritional intake. Due to her inability to eat adequately, she currently receives nutrition via a PICC line and is considering transitioning to a feeding tube. She is also currently receiving iron infusions for anemia.  The patient's medical history also includes chronic migraines, which she attributes to multiple concussions. She recently underwent her third endoscopy.  ROS: As per HPI.   Studies Reviewed: .   Cardiac Studies & Procedures         MONITORS  LONG TERM MONITOR (3-14 DAYS) 08/10/2021  Narrative Patch Wear Time:  14 days and 0 hours (2022-12-15T20:22:34-0500 to 2022-12-29T20:22:38-0500)  Rhythm   Sinus rhythm   Rates 50 to 137 bpm    Average HR 78 Triggered events correlated with SR and Sinus tachycardia (110s/) Diary events correlated with SR/Sinus tachycardia Rare PVC No arrhythmias noted.            Physical Exam:    VS:  BP 112/70   Pulse 75   Ht 5\' 9"  (1.753 m)   Wt 149 lb 6.4 oz (67.8 kg)   SpO2 99%   BMI 22.06 kg/m    Wt Readings from Last 3 Encounters:  06/26/23 149 lb 6.4 oz (67.8 kg)  06/18/23 140 lb (63.5 kg)  06/04/23 140 lb (63.5 kg)    Gen: no distress  Neck: No JVD Cardiac: No Rubs or Gallops, no murmur, RRR +2 radial pulses Respiratory: Clear to auscultation bilaterally, normal effort, normal respiratory rate GI: Soft, nontender, non-distended  MS: No  edema;  moves all extremities Integument: Skin feels warm Neuro:  At time of evaluation, alert and oriented to person/place/time/situation  Psych: Normal affect, patient feels fair   ASSESSMENT AND PLAN: .    Palpitations and Tachycardia - Intermittent episodes of palpitations and tachycardia with associated chest discomfort, dyspnea, lightheadedness, and feeling hot. Previous Holter monitor in 2022 indicated need for cardiology evaluation. Differential diagnosis includes SVT, inappropriate sinus tachycardia, and appropriate sinus tachycardia secondary to nutritional deficiency. Orthostatic hypotension and POTS ruled out. SVT may require medication. Inappropriate sinus tachycardia is less likely. Appropriate sinus tachycardia could be due to nutritional deficiency, requiring GI issue management. - Order two-week non-live Zio patch heart monitor - Schedule follow-up in three months with PA or NP, but follow PRN if results are normal  Iron Deficiency Anemia - Currently anemic, receiving  iron infusions likely secondary to chronic gastrointestinal issues and malnutrition. 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 - Continue current management, could contribute  to tachycardia   Riley Lam, MD FASE [facility] Cardiologist [facility]  [address], [address] [place], [place] [address] [phone]  3:27 PM

## 2023-06-27 ENCOUNTER — Ambulatory Visit: Payer: No Typology Code available for payment source

## 2023-06-27 VITALS — BP 105/70 | HR 69 | Temp 98.5°F | Resp 18 | Ht 69.0 in | Wt 148.2 lb

## 2023-06-27 DIAGNOSIS — D509 Iron deficiency anemia, unspecified: Secondary | ICD-10-CM

## 2023-06-27 MED ORDER — DIPHENHYDRAMINE HCL 25 MG PO CAPS
25.0000 mg | ORAL_CAPSULE | Freq: Once | ORAL | Status: AC
Start: 2023-06-27 — End: 2023-06-27
  Administered 2023-06-27: 25 mg via ORAL
  Filled 2023-06-27: qty 1

## 2023-06-27 MED ORDER — HEPARIN SOD (PORK) LOCK FLUSH 100 UNIT/ML IV SOLN
500.0000 [IU] | Freq: Once | INTRAVENOUS | Status: DC | PRN
Start: 2023-06-27 — End: 2023-06-27

## 2023-06-27 MED ORDER — ACETAMINOPHEN 325 MG PO TABS
650.0000 mg | ORAL_TABLET | Freq: Once | ORAL | Status: AC
Start: 2023-06-27 — End: 2023-06-27
  Administered 2023-06-27: 650 mg via ORAL
  Filled 2023-06-27: qty 2

## 2023-06-27 MED ORDER — HEPARIN SOD (PORK) LOCK FLUSH 100 UNIT/ML IV SOLN: 250.0000 [IU] | Freq: Once | INTRAVENOUS | Status: DC | PRN

## 2023-06-27 MED ORDER — SODIUM CHLORIDE 0.9 % IV SOLN
510.0000 mg | Freq: Once | INTRAVENOUS | Status: AC
  Administered 2023-06-27: 510 mg via INTRAVENOUS
  Filled 2023-06-27: qty 17

## 2023-06-27 NOTE — Progress Notes (Signed)
Diagnosis: Iron Deficiency Anemia  Provider:  Chilton Greathouse MD  Procedure: IV Infusion  IV Type: PICC, IV Location: R Upper Arm  Feraheme (Ferumoxytol), Dose: 510 mg  Infusion Start Time: 1332  Infusion Stop Time: 1348  Post Infusion IV Care:  Observation declined and PICC line flushed.  Discharge: Condition: Good, Destination: Home . AVS Declined  Performed by:  Rico Ala, LPN

## 2023-06-28 LAB — LAB REPORT - SCANNED: EGFR: 130

## 2023-07-01 ENCOUNTER — Encounter: Payer: Self-pay | Admitting: Gastroenterology

## 2023-07-01 NOTE — Progress Notes (Signed)
Review of outside [facility] records  WBC 8.1 Hemoglobin/hematocrit 10.1/31.6 Platelets 275 MCV 87 Sodium 140 Potassium 4.0 BUN/creatinine 24/0.58 AST/ALT 32/75 Alkaline phosphatase 55 Total bili 0.3 Magnesium 1.9 Phosphorus 3.9   Her ALT is risen for unclear etiology.  The TPN may be a potential etiology for this.  We need to monitor this but if this continues to increase to greater than 3 times the upper limit of normal, we will need to consider transitioning her TPN type.  Please reach out to her TPN service and we need to have the potential discussions or alert them that it is possible we may need to adjust her TPN pending her follow-up labs in 1 month.  There had also been some thought that the patient may be moving forward with a potential PEG tube pending review by the surgeons at [facility].  You may let the patient know that we are going to monitor the ALT closely.  I think the other thing to consider in this patient is where things stand in regards to the iron infusions.  Have we been able to have that set up as of yet with hematology to try to bump her stores?   Thanks. GM

## 2023-07-02 DIAGNOSIS — D509 Iron deficiency anemia, unspecified: Secondary | ICD-10-CM | POA: Diagnosis not present

## 2023-07-02 DIAGNOSIS — R42 Dizziness and giddiness: Secondary | ICD-10-CM | POA: Diagnosis not present

## 2023-07-02 DIAGNOSIS — R002 Palpitations: Secondary | ICD-10-CM | POA: Diagnosis not present

## 2023-07-02 DIAGNOSIS — R000 Tachycardia, unspecified: Secondary | ICD-10-CM

## 2023-07-07 NOTE — Progress Notes (Signed)
[employer] is the company pt gets TPN  She has gotten her Iron infusions.

## 2023-07-07 NOTE — Progress Notes (Signed)
Message sent to the pt via My Chart to get the TPN information as well as checking the status of the iron infusions.

## 2023-07-08 NOTE — Progress Notes (Signed)
[employer] [address] [place], [place] [address] [phone] Toll-[phone] [phone]   Information has been faxed to [facility] with the possibility of TPN adjustment

## 2023-07-11 ENCOUNTER — Other Ambulatory Visit: Payer: Self-pay

## 2023-07-11 ENCOUNTER — Telehealth: Payer: Self-pay

## 2023-07-11 DIAGNOSIS — R112 Nausea with vomiting, unspecified: Secondary | ICD-10-CM

## 2023-07-11 DIAGNOSIS — R1084 Generalized abdominal pain: Secondary | ICD-10-CM

## 2023-07-11 DIAGNOSIS — F509 Eating disorder, unspecified: Secondary | ICD-10-CM

## 2023-07-11 DIAGNOSIS — R634 Abnormal weight loss: Secondary | ICD-10-CM

## 2023-07-11 NOTE — Telephone Encounter (Signed)
Patty, I see the reply from the vascular surgery team. I cannot actually see the telemedicine visit report itself, I would ask that we try to get that actual visit report so I can read their note completely. But as the MyChart message that she has forwarded is from the [facility] vascular surgery team and they have indicated okay for G-tube being placed, we can go ahead and move forward with trying to get that scheduled. We will need to get insurance approval for this. We will also need to make sure that we have a nutrition team that we will be willing to work with the patient and [place]. The goal will be for TPN to be transition to G-tube feeding completely if possible.  Not to have both. If she were to fail G-tube feeding, she would need TPN to be continued versus transitioning G-tube to a GJ with jejunal feeding.   Otherwise our [facility] colleagues may have to help [place] with getting support for G-tube feeding titration. Thanks. GM

## 2023-07-11 NOTE — Telephone Encounter (Signed)
Note from Dr Meridee Score (Newest Message First) View All Conversations on this Encounter Jaime Bauer, Jaime Starring., Jaime Bauer  You5 hours ago (4:58 AM)    Jaime Bauer, I see the reply from the vascular surgery team. I cannot actually see the telemedicine visit report itself, I would ask that we try to get that actual visit report so I can read their note completely. But as the MyChart message that she has forwarded is from the [facility] vascular surgery team and they have indicated okay for G-tube being placed, we can go ahead and move forward with trying to get that scheduled. We will need to get insurance approval for this. We will also need to make sure that we have a nutrition team that we will be willing to work with the patient and [place]. The goal will be for TPN to be transition to G-tube feeding completely if possible.  Not to have both. If she were to fail G-tube feeding, she would need TPN to be continued versus transitioning G-tube to a GJ with jejunal feeding.   Otherwise our [facility] colleagues may have to help [place] with getting support for G-tube feeding titration. Thanks. GM      Note   You routed conversation to Jaime Bauer, Jaime Starring., MD3 days ago   Jaime Bauer  You3 days ago    Hey Dr. Meridee Score,   Attached is approval from the vascular surgeon at [facility] to move for with the g tube! Please let me know what the next steps are  Attachments  [id number]    Jaime Bauer  You2 weeks ago    I meet with a vascular surgeon from [facility] on Monday, I'll get documentation then!    You  Jaime Catarineau2 weeks ago    We should just make sure that the surgeons at [facility], can document that they are okay if a PEG tube is placed, since they were planning potential other interventions and were still having discussions.  I do not want to have a PEG tube placed and then they have issues from a surgical standpoint, in case something else is  being offered. Thus, we need to see documentation in your chart that says okay for PEG tube placement from [facility] surgeons.  You can reach out to them to confirm and send this question as well. Thanks. GM      Jaime Bauer, Jaime Starring., Jaime Bauer  You2 weeks ago    Jaime Bauer, Please let patient know, we should just make sure that the patient's surgeons at [facility], can document that they are okay if a PEG tube is placed, since they were planning potential other interventions and were still having discussions.  I do not want to have a PEG tube placed and then they have issues from a surgical standpoint, in case something else is being offered. Thus, we need to see documentation in the patient's chart that says okay for PEG tube placement from [facility] surgeons.  She can reach out to them to confirm and send this question as well. Thanks. GM      Note   You routed conversation to Jaime Bauer, Jaime Starring., MD2 weeks ago   [employer] weeks ago    I'm not sure if I'm going to be able to make it home in December so if I'm able to get it placed with you that would be ideal!     Jaime Bauer  You2 weeks ago    Here are the  orders!   Attachments  [email or web address]    You  Jaime Catarineau2 weeks ago    Once we can see documentation of the notation in either care everywhere or separate faxed note, we can decide how we approach potential placement of a G-tube. Versus when she heads back home in December for winter break, she could have a G-tube placed while she is in town there, since they are the ones recommending. Lets see the notes. Thanks. GM

## 2023-07-15 ENCOUNTER — Telehealth: Payer: Self-pay

## 2023-07-15 NOTE — Telephone Encounter (Signed)
PLE, Thank you for letting us know. GM  Townsend Cudworth, Looks like dietary and nutrition will not follow her in their clinic.  We will need to find a home health provider to see if we can get tube feed orders and follow-up for this individual if/when she gets her G-tube placed. You may discontinue this referral and let me know what you find out may be possible. Thanks. GM       Previous Messages    ----- Message ----- From: Renee Ramus Sent: 07/15/2023  10:11 AM EST To: Lemar Lofty., MD  Good morning,  We cannot see this patient in our office, you may need to reach out to a home health care office to see if they can provide services.  Thank you

## 2023-07-16 ENCOUNTER — Encounter: Payer: Self-pay | Admitting: Gastroenterology

## 2023-07-16 NOTE — Telephone Encounter (Signed)
The pt responded to the My Chart message and states that she uses [facility]. I will call and see if they can handle.

## 2023-07-16 NOTE — Telephone Encounter (Signed)
Message sent to the pt to see if she has ever seen a home health agency that I can send orders to for G tube follow up.

## 2023-07-16 NOTE — Telephone Encounter (Signed)
Referral from has been faxed to [facility] for nutrition appt. Will await response

## 2023-08-01 ENCOUNTER — Telehealth: Payer: Self-pay | Admitting: Gastroenterology

## 2023-08-01 NOTE — Telephone Encounter (Signed)
Inbound call from patient stating she has been very nauseous and medication has not been helping. Patient is requesting a call back to be advised further. Please advise, thank you.

## 2023-08-07 ENCOUNTER — Encounter: Payer: Self-pay | Admitting: Internal Medicine

## 2023-08-07 MED ORDER — DILTIAZEM HCL ER COATED BEADS 120 MG PO CP24: 120.0000 mg | ORAL_CAPSULE | Freq: Every day | ORAL | 11 refills | Status: DC

## 2023-08-07 NOTE — Telephone Encounter (Signed)
 See MyChart message

## 2023-08-08 NOTE — Telephone Encounter (Signed)
 Jaime Bauer, May send Phenergan  12.5 mg every 8 hours as needed (20/0) We need to find out from the different home health agencies that were contacted as to what they would allow us  to be able to do. If you are still waiting to hear about that, we can still move forward with a PEG being placed, we just need the patient to be aware that we cannot initiate any nutrition until we have an actual plan for management of that (since we do not normally manage that here in [place]).  Thus I think that is the reason for why the procedure has not been scheduled, because I am not sure that we have the request in as of yet. Please help sort this out next week. Thanks. GM

## 2023-08-08 NOTE — Telephone Encounter (Signed)
 Patty, I reviewed the patient's telemedicine visit from 12/19.  It looks like they were wanting the patient to have a celiac artery block.  Was she going to have that done in [place]  for what was the plan of action? Where do we stand in regards to other home health agencies that have the ability to manage PEG feeding tube and nutrition management? Patient has not had significant improvement with multiple antiemetics, though at times we may switch them out. Would she want to try Phenergan  or Zofran  for a short period of time (I know she has been on these in the past and not had effectiveness but if she is not tolerating anything)? Or does she want to have Compazine  suppositories? Thanks. GM

## 2023-08-11 MED ORDER — PROMETHAZINE HCL 12.5 MG PO TABS: 12.5000 mg | ORAL_TABLET | Freq: Three times a day (TID) | ORAL | 0 refills | Status: AC | PRN

## 2023-08-11 NOTE — Addendum Note (Signed)
 Addended by: Loretha Stapler on: 08/11/2023 08:56 AM   Modules accepted: Orders

## 2023-08-22 ENCOUNTER — Telehealth: Payer: Self-pay | Admitting: Gastroenterology

## 2023-08-22 ENCOUNTER — Encounter: Payer: Self-pay | Admitting: Gastroenterology

## 2023-08-22 MED ORDER — CEPHALEXIN 500 MG PO CAPS: 500.0000 mg | ORAL_CAPSULE | Freq: Two times a day (BID) | ORAL | 0 refills | Status: DC

## 2023-08-22 NOTE — Addendum Note (Signed)
Addended by: Loretha Stapler on: 08/22/2023 04:42 PM   Modules accepted: Orders

## 2023-08-22 NOTE — Telephone Encounter (Signed)
Pt responded to via pt advice request.

## 2023-08-22 NOTE — Telephone Encounter (Signed)
Patient called and requested that Patty return her call in regards to a G-tube. Please advise.

## 2023-08-25 ENCOUNTER — Telehealth: Payer: Self-pay

## 2023-08-25 ENCOUNTER — Other Ambulatory Visit: Payer: Self-pay

## 2023-08-25 ENCOUNTER — Encounter: Payer: Self-pay | Admitting: Gastroenterology

## 2023-08-25 DIAGNOSIS — R1084 Generalized abdominal pain: Secondary | ICD-10-CM

## 2023-08-25 DIAGNOSIS — R634 Abnormal weight loss: Secondary | ICD-10-CM

## 2023-08-25 DIAGNOSIS — R112 Nausea with vomiting, unspecified: Secondary | ICD-10-CM

## 2023-08-25 DIAGNOSIS — F509 Eating disorder, unspecified: Secondary | ICD-10-CM

## 2023-08-25 NOTE — Telephone Encounter (Signed)
Mansouraty, Netty Starring., MD to Me     08/22/23  4:08 PM Jaime Bauer, OK to place order for new pick line. Send her in Keflex 500 mg BID to decrease risk of infection (14/0), pending. She doesn't have a penicillin allergy. Thanks. GM

## 2023-08-25 NOTE — Telephone Encounter (Signed)
[facility]  [place] [place] [facility]   [address] [place], [place] [address] Phone [phone] Fax [phone]  The pt would like referral to [facility] for G tube follow up.

## 2023-08-25 NOTE — Telephone Encounter (Signed)
PICC line order placed and appt made for 1/21 at 8 am. Pt aware

## 2023-08-26 ENCOUNTER — Ambulatory Visit (HOSPITAL_COMMUNITY)
Admission: RE | Admit: 2023-08-26 | Discharge: 2023-08-26 | Discharge status: Home / Self Care | Payer: No Typology Code available for payment source | Source: Ambulatory Visit | Attending: Gastroenterology | Admitting: Gastroenterology

## 2023-08-26 ENCOUNTER — Encounter: Payer: Self-pay | Admitting: Medical-Surgical

## 2023-08-26 ENCOUNTER — Other Ambulatory Visit: Payer: Self-pay | Admitting: Gastroenterology

## 2023-08-26 DIAGNOSIS — R112 Nausea with vomiting, unspecified: Secondary | ICD-10-CM

## 2023-08-26 DIAGNOSIS — R1084 Generalized abdominal pain: Secondary | ICD-10-CM | POA: Insufficient documentation

## 2023-08-26 DIAGNOSIS — F509 Eating disorder, unspecified: Secondary | ICD-10-CM | POA: Insufficient documentation

## 2023-08-26 DIAGNOSIS — R634 Abnormal weight loss: Secondary | ICD-10-CM | POA: Diagnosis not present

## 2023-08-26 MED ORDER — LIDOCAINE HCL 1 % IJ SOLN
20.0000 mL | Freq: Once | INTRAMUSCULAR | Status: AC
  Administered 2023-08-26: 3 mL via INTRADERMAL

## 2023-08-26 MED ORDER — LIDOCAINE HCL 1 % IJ SOLN
INTRAMUSCULAR | Status: AC
  Filled 2023-08-26: qty 20

## 2023-08-26 MED ORDER — HEPARIN SOD (PORK) LOCK FLUSH 100 UNIT/ML IV SOLN
INTRAVENOUS | Status: AC
  Filled 2023-08-26: qty 5

## 2023-08-26 NOTE — Telephone Encounter (Signed)
Dr Meridee Score the pt is also asking if she can go ahead and schedule the G tube placement, please advise.  (She is aware you are out of the office and will await a response)

## 2023-08-26 NOTE — Progress Notes (Signed)
Patient presented to IR for PICC eval and possible exchange/new placement.   Patient states that the skin around the PICC has been rad and it has been causing pain lately.   The skin around the insertion site with NO erythema, tenderness, or increased warmth. Due to the pain that the PICC is causing, will proceed with new PICC placement on the left arm.    Lynann Bologna Makina Skow PA-C 08/26/2023 8:51 AM

## 2023-08-26 NOTE — Procedures (Addendum)
PROCEDURE SUMMARY:  Successful placement of sinlge lumen PICC line to left basilic vein. Length 39 cm Tip at lower SVC/RA PICC capped No complications Ready for use  EBL < 5 mL  Right arm PICC removed w/o difficulty   Latonya Knight H Envy Meno PA-C 08/26/2023, 9:08 AM

## 2023-08-26 NOTE — Telephone Encounter (Signed)
Need tube feed orders and follow-up after G-tube placed.   Dr Meridee Score I have spoken to several home health agencies. [facility] will assist the pt but want the referral to come from the PCP. I will let the pt know and send a copy to the PCP as well.

## 2023-08-28 ENCOUNTER — Encounter (HOSPITAL_COMMUNITY): Payer: Self-pay

## 2023-08-28 ENCOUNTER — Other Ambulatory Visit: Payer: Self-pay

## 2023-08-28 DIAGNOSIS — R1084 Generalized abdominal pain: Secondary | ICD-10-CM

## 2023-08-28 DIAGNOSIS — F509 Eating disorder, unspecified: Secondary | ICD-10-CM

## 2023-08-28 DIAGNOSIS — R112 Nausea with vomiting, unspecified: Secondary | ICD-10-CM

## 2023-08-28 DIAGNOSIS — R634 Abnormal weight loss: Secondary | ICD-10-CM

## 2023-08-28 NOTE — Progress Notes (Signed)
RE: Fr[place] Chase Request Received: Today Oley Balm, MD  [facility] Ok to sched CT shows window for G tube DDH

## 2023-08-30 ENCOUNTER — Other Ambulatory Visit: Payer: Self-pay | Admitting: Gastroenterology

## 2023-09-03 ENCOUNTER — Ambulatory Visit: Payer: No Typology Code available for payment source | Admitting: Gastroenterology

## 2023-09-11 NOTE — Telephone Encounter (Signed)
 I spoke with Jaime Bauer at  [facility] and confirmed that the pt has orders from the [facility] for G tube feedings.  He will reach out to that office for further information.

## 2023-09-11 NOTE — Telephone Encounter (Signed)
 Inbound call from [place] from [facility] [phone], would like to speak to nurse in regards to G tube feedings for patient.

## 2023-09-13 ENCOUNTER — Other Ambulatory Visit: Payer: Self-pay | Admitting: Gastroenterology

## 2023-09-14 ENCOUNTER — Other Ambulatory Visit: Payer: Self-pay | Admitting: Radiology

## 2023-09-14 DIAGNOSIS — R112 Nausea with vomiting, unspecified: Secondary | ICD-10-CM

## 2023-09-15 NOTE — H&P (Signed)
Chief Complaint: Chronic nausea/vomiting, unintentional weight loss, eating disorder; referred for gastrostomy tube placement  Referring Provider(s): Mansouraty,G  Supervising Physician: Roanna Banning  Patient Status: [facility] - Out-pt  History of Present Illness: Jaime Bauer is a 23 y.o. 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  Patient has been on TPN.  She has not had significant improvement with multiple antiemetics.  CT abdomen pelvis in August of last year revealed a left kidney cyst, left kidney nonobstructing stones, no acute abdominal pelvic pathology.  Patient known to IR team from prior PICC placements, latest on 08/26/23.  Request now received from GI team for placement of percutaneous gastrostomy tube to assist with nutritional intake.   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  Medications: Prior to Admission medications   Medication Sig Start Date End Date Taking? Authorizing Provider  AMBULATORY NON FORMULARY MEDICATION TPN per [facility] protocol for labs and dosing 02/25/23   Mansouraty, Netty Starring., MD  cephALEXin  (KEFLEX) 500 MG capsule Take 1 capsule (500 mg total) by mouth 2 (two) times daily. 08/22/23   Mansouraty, Netty Starring., MD  diltiazem (CARDIZEM CD) 120 MG 24 hr capsule Take 1 capsule (120 mg total) by mouth daily. 08/07/23   Christell Constant, MD  ferrous gluconate (FERGON) 324 MG tablet Take 1 tablet (324 mg total) by mouth daily with breakfast. 06/03/23   Mansouraty, Netty Starring., MD  FLUoxetine (PROZAC) 20 MG capsule Take 80 mg by mouth every morning. 02/20/21   [provider]  levonorgestrel (KYLEENA) 19.5 MG IUD 1 each by Intrauterine route once.    [provider]  omeprazole (PRILOSEC) 40 MG capsule TAKE 1 CAPSULE BY MOUTH DAILY 09/01/23   Mansouraty, Netty Starring., MD  prochlorperazine (COMPAZINE) 10 MG tablet Take 1 tablet (10 mg total) by mouth every 6 (six) hours as needed for nausea or vomiting. 03/18/23   Mansouraty, Netty Starring., MD  promethazine (PHENERGAN) 12.5 MG tablet Take 1 tablet (12.5 mg total) by mouth every 8 (eight) hours as needed for nausea or vomiting. 08/11/23   Mansouraty, Netty Starring., MD  Simethicone 125 MG CAPS Take 1 capsule (125 mg total) by mouth in the morning, at noon, in the evening, and at bedtime. 04/16/23   Christen Butter, NP  Vitamin D, Ergocalciferol, (DRISDOL) 1.25 MG (50000 UNIT) CAPS capsule TAKE 1 CAPSULE BY MOUTH ONCE WEEKLY 09/15/23   Mansouraty, Netty Starring., MD     Family History  Problem Relation Age of Onset   Skin cancer Mother    Colon polyps Mother    Cancer Father    Colon cancer Father 51  Colon polyps Father    Rectal cancer Father    Hypertension Paternal Uncle    Cancer Maternal Grandmother    Stroke Maternal Grandfather    GER disease Other    Esophageal cancer Neg Hx    Inflammatory bowel disease Neg Hx    Liver disease Neg Hx    Pancreatic cancer Neg Hx    Stomach cancer Neg Hx     Social History   Socioeconomic History   Marital status: Single    Spouse name: Not on file   Number of children: 0   Years of  education: 14   Highest education level: Not on file  Occupational History   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  Other Topics Concern   Not on file  Social History Narrative   Right handed   Drinks caffeine   Two story home   Social Drivers of Health   Financial Resource Strain: Not on file  Food Insecurity: No Food Insecurity (09/02/2023)   Received from [facility]   Hunger Vital Sign    Worried About Running Out of Food in the Last Year: Never true    Ran Out of Food in the Last Year: Never true  Transportation Needs: No Transportation Needs (03/12/2023)   [facility] - Transportation    Lack of Transportation (Medical): No    Lack of Transportation (Non-Medical): No  Physical Activity: Inactive (11/08/2022)   [facility]   Exercise Vital Sign    Days of Exercise per Week: 0 days    Minutes of Exercise per Session: 0 min  Stress: Not on file  Social Connections: Unknown (05/13/2022)   Received from [facility], [facility]   Social Network    Social Network: Not on file       Review of Systems: denies fever, CP,dyspnea, cough, back pain,vomiting or bleeding; she does have chronic HA's, abd pain, nausea  Vital Signs: Vitals:   09/16/23 0811  BP: 105/67  Pulse: 73  Resp: 16  Temp: 98.6 F (37 C)  SpO2: 100%     Advance Care Plan: no documents on file  Physical Exam: awake/alert; chest- CTA bilat; heart- RRR; abd-soft,+BS, some mild gen tenderness to palpation; no LE edema  Imaging: IR PICC PLACEMENT LEFT >5 YRS INC IMG GUIDE Result Date: 08/26/2023 INDICATION: 23 year old female with severe protein calorie malnutrition status post right arm PICC placement in July 2024. The  RIGHT arm PICC has been causing pain. Request for new PICC line placement. EXAM: ULTRASOUND AND FLUOROSCOPIC GUIDED PICC LINE INSERTION MEDICATIONS: 1% lidocaine CONTRAST:  None FLUOROSCOPY TIME:  30 seconds (1 mGy) COMPLICATIONS: None immediate. TECHNIQUE: The procedure, risks, benefits, and alternatives were explained to the patient and informed written consent was obtained. The left upper extremity was prepped with chlorhexidine in a sterile fashion, and a sterile drape was applied covering the operative field. Maximum barrier sterile technique with sterile gowns and gloves were used for the procedure. A timeout was performed prior to the initiation of the procedure. Local anesthesia was provided with 1% lidocaine. After the overlying soft tissues were anesthetized with 1% lidocaine, a micropuncture kit was utilized to access the left basilic  vein. Real-time ultrasound guidance was utilized for vascular access including the acquisition of a permanent ultrasound image documenting patency of the accessed vessel. A guidewire was advanced to the level of the superior caval-atrial junction for measurement purposes and the PICC line was cut to length. A peel-away sheath was placed and a 39 cm, 5 [place], single lumen was inserted to level of the superior caval-atrial junction. A post procedure spot fluoroscopic was obtained. The catheter easily aspirated and flushed and was secured in place. A dressing was placed. The patient tolerated the procedure well without immediate post procedural complication. FINDINGS: After catheter placement, the tip lies within the superior apect of the right atrium. The catheter aspirates and flushes normally and is ready for immediate use. IMPRESSION: Successful ultrasound and fluoroscopic guided placement of a LEFT basilic vein approach, 39 cm, 5 Fr single lumen PICC The tip of the catheter is positioned within the proximal RIGHT atrium. The PICC line is ready for immediate use. The  contralateral (RIGHT) arm PICC was removed. Performed by: Lawernce Ion, PA-C Electronically Signed   By: Roanna Banning M.D.   On: 08/26/2023 09:49    Labs:  CBC: Recent Labs    02/19/23 1702 06/04/23 1140 06/16/23 1015  WBC 6.1 5.6 6.0  HGB 12.6 10.5* 10.7*  HCT 37.8 32.6* 32.1*  PLT 334.0 282.0 275.0    COAGS: Recent Labs    02/19/23 1702  INR 1.2*    BMP: Recent Labs    02/19/23 1702 02/25/23 1155 03/19/23 1233 06/04/23 1140 06/16/23 1015  NA 136 136  --   --   --   K 3.8 3.9  --   --   --   CL 101 106  --   --   --   CO2 28 23  --   --   --   GLUCOSE 86 112*  --   --   --   BUN 17 20  --  24* 22  CALCIUM 10.0 9.0  --   --   --   CREATININE 0.66 0.65  --  0.59 0.67  GFRNONAA  --  >60 139  --   --     LIVER FUNCTION TESTS: Recent Labs    02/19/23 1702 02/25/23 1155  BILITOT 0.5 0.3  AST 11 13*  ALT 8 12  ALKPHOS 48 45  PROT 7.1 6.9  ALBUMIN 4.6 4.0    TUMOR MARKERS: No results for input(s): "AFPTM", "CEA", "CA199", "CHROMGRNA" in the last 8760 hours.  Assessment and Plan: 23 y.o. 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  Patient has been on TPN.  She has not had significant improvement with multiple antiemetics.  CT abdomen pelvis in August of last year revealed a left kidney cyst, left kidney nonobstructing stones, no acute abdominal pelvic pathology.  Patient known to IR team from prior PICC placements, latest on 08/26/23.  Request now received from GI team for placement of percutaneous gastrostomy tube to assist with nutritional intake. Risks and benefits image guided gastrostomy tube placement was discussed with  the patient including, but not limited to the need for a barium enema during the procedure, bleeding, infection, peritonitis and/or damage to adjacent structures.  All of the patient's questions were answered,  patient is agreeable to proceed.  Consent signed and in chart.    Thank you for allowing our service to participate in Jaime Bauer 's care.  Electronically Signed: D. Jeananne Rama, PA-C   09/15/2023, 3:09 PM      I spent a total of    25 Minutes in face to face in clinical consultation, greater than 50% of which was counseling/coordinating care for percutaneous gastrostomy tube placement

## 2023-09-16 ENCOUNTER — Encounter (HOSPITAL_COMMUNITY): Payer: Self-pay

## 2023-09-16 ENCOUNTER — Ambulatory Visit (HOSPITAL_COMMUNITY)
Admission: RE | Admit: 2023-09-16 | Discharge: 2023-09-16 | Disposition: A | Discharge status: Home / Self Care | Payer: No Typology Code available for payment source | Source: Ambulatory Visit | Attending: Gastroenterology | Admitting: Gastroenterology

## 2023-09-16 ENCOUNTER — Other Ambulatory Visit: Payer: Self-pay

## 2023-09-16 ENCOUNTER — Other Ambulatory Visit (HOSPITAL_COMMUNITY): Payer: Self-pay

## 2023-09-16 ENCOUNTER — Other Ambulatory Visit (HOSPITAL_COMMUNITY): Payer: Self-pay | Admitting: Interventional Radiology

## 2023-09-16 VITALS — BP 89/51 | HR 93 | Temp 99.0°F | Resp 12 | Ht 69.0 in

## 2023-09-16 DIAGNOSIS — F419 Anxiety disorder, unspecified: Secondary | ICD-10-CM | POA: Diagnosis not present

## 2023-09-16 DIAGNOSIS — R634 Abnormal weight loss: Secondary | ICD-10-CM

## 2023-09-16 DIAGNOSIS — F509 Eating disorder, unspecified: Secondary | ICD-10-CM

## 2023-09-16 DIAGNOSIS — E46 Unspecified protein-calorie malnutrition: Secondary | ICD-10-CM

## 2023-09-16 DIAGNOSIS — R112 Nausea with vomiting, unspecified: Secondary | ICD-10-CM | POA: Insufficient documentation

## 2023-09-16 DIAGNOSIS — Z6821 Body mass index (BMI) 21.0-21.9, adult: Secondary | ICD-10-CM | POA: Insufficient documentation

## 2023-09-16 DIAGNOSIS — F32A Depression, unspecified: Secondary | ICD-10-CM | POA: Diagnosis not present

## 2023-09-16 DIAGNOSIS — R1084 Generalized abdominal pain: Secondary | ICD-10-CM | POA: Insufficient documentation

## 2023-09-16 HISTORY — PX: IR GASTROSTOMY TUBE MOD SED: IMG625

## 2023-09-16 LAB — BASIC METABOLIC PANEL
Anion gap: 9 (ref 5–15)
BUN: 25 mg/dL — ABNORMAL HIGH (ref 6–20)
CO2: 23 mmol/L (ref 22–32)
Calcium: 8.9 mg/dL (ref 8.9–10.3)
Chloride: 104 mmol/L (ref 98–111)
Creatinine, Ser: 0.61 mg/dL (ref 0.44–1.00)
GFR, Estimated: >60 mL/min (ref >60)
Glucose, Bld: 100 mg/dL — ABNORMAL HIGH (ref 70–99)
Potassium: 4 mmol/L (ref 3.5–5.1)
Sodium: 136 mmol/L (ref 135–145)

## 2023-09-16 LAB — CBC WITH DIFFERENTIAL/PLATELET
Abs Immature Granulocytes: 0.04 10*3/uL (ref 0.00–0.07)
Basophils Absolute: 0 10*3/uL (ref 0.0–0.1)
Basophils Relative: 1 %
Eosinophils Absolute: 0.2 10*3/uL (ref 0.0–0.5)
Eosinophils Relative: 3 %
HCT: 37.1 % (ref 36.0–46.0)
Hemoglobin: 12.1 g/dL (ref 12.0–15.0)
Immature Granulocytes: 1 %
Lymphocytes Relative: 30 %
Lymphs Abs: 1.9 10*3/uL (ref 0.7–4.0)
MCH: 29.1 pg (ref 26.0–34.0)
MCHC: 32.6 g/dL (ref 30.0–36.0)
MCV: 89.2 fL (ref 80.0–100.0)
Monocytes Absolute: 0.6 10*3/uL (ref 0.1–1.0)
Monocytes Relative: 10 %
Neutro Abs: 3.6 10*3/uL (ref 1.7–7.7)
Neutrophils Relative %: 55 %
Platelets: 264 10*3/uL (ref 150–400)
RBC: 4.16 MIL/uL (ref 3.87–5.11)
RDW: 12.9 % (ref 11.5–15.5)
WBC: 6.4 10*3/uL (ref 4.0–10.5)
nRBC: 0 % (ref 0.0–0.2)

## 2023-09-16 LAB — POCT PREGNANCY, URINE: Preg Test, Ur: NEGATIVE

## 2023-09-16 LAB — PROTIME-INR
INR: 1 (ref 0.8–1.2)
Prothrombin Time: 13.9 s (ref 11.4–15.2)

## 2023-09-16 MED ORDER — LIDOCAINE-EPINEPHRINE 1 %-1:100000 IJ SOLN
INTRAMUSCULAR | Status: AC
  Filled 2023-09-16: qty 1

## 2023-09-16 MED ORDER — LIDOCAINE VISCOUS HCL 2 % MT SOLN
OROMUCOSAL | Status: AC
  Filled 2023-09-16: qty 15

## 2023-09-16 MED ORDER — DIPHENHYDRAMINE HCL 50 MG/ML IJ SOLN
INTRAMUSCULAR | Status: AC | PRN
  Administered 2023-09-16: 25 mg via INTRAVENOUS

## 2023-09-16 MED ORDER — GLUCAGON HCL RDNA (DIAGNOSTIC) 1 MG IJ SOLR
INTRAMUSCULAR | Status: AC
  Filled 2023-09-16: qty 1

## 2023-09-16 MED ORDER — CEFAZOLIN SODIUM-DEXTROSE 2-4 GM/100ML-% IV SOLN
INTRAVENOUS | Status: AC
  Filled 2023-09-16: qty 100

## 2023-09-16 MED ORDER — DIPHENHYDRAMINE HCL 50 MG/ML IJ SOLN
INTRAMUSCULAR | Status: AC
  Filled 2023-09-16: qty 1

## 2023-09-16 MED ORDER — SODIUM CHLORIDE 0.9 % IV SOLN: INTRAVENOUS | Status: DC

## 2023-09-16 MED ORDER — CEFAZOLIN SODIUM-DEXTROSE 2-4 GM/100ML-% IV SOLN
2.0000 g | INTRAVENOUS | Status: AC
  Administered 2023-09-16: 2 g via INTRAVENOUS

## 2023-09-16 MED ORDER — MIDAZOLAM HCL 2 MG/2ML IJ SOLN
INTRAMUSCULAR | Status: AC | PRN
  Administered 2023-09-16 (×4): 1 mg via INTRAVENOUS

## 2023-09-16 MED ORDER — GLUCAGON HCL RDNA (DIAGNOSTIC) 1 MG IJ SOLR
INTRAMUSCULAR | Status: AC | PRN
  Administered 2023-09-16: .5 mg via INTRAVENOUS

## 2023-09-16 MED ORDER — LIDOCAINE-EPINEPHRINE 1 %-1:100000 IJ SOLN
20.0000 mL | Freq: Once | INTRAMUSCULAR | Status: AC
Start: 2023-09-16 — End: 2023-09-16
  Administered 2023-09-16: 20 mL via INTRADERMAL

## 2023-09-16 MED ORDER — IOHEXOL 300 MG/ML  SOLN
50.0000 mL | Freq: Once | INTRAMUSCULAR | Status: AC | PRN
  Administered 2023-09-16: 20 mL

## 2023-09-16 MED ORDER — FENTANYL CITRATE (PF) 100 MCG/2ML IJ SOLN
INTRAMUSCULAR | Status: AC
  Filled 2023-09-16: qty 4

## 2023-09-16 MED ORDER — ONDANSETRON HCL 4 MG/2ML IJ SOLN: 4.0000 mg | INTRAMUSCULAR | Status: DC | PRN

## 2023-09-16 MED ORDER — HYDROCODONE-ACETAMINOPHEN 5-325 MG PO TABS: 1.0000 | ORAL_TABLET | ORAL | Status: DC | PRN

## 2023-09-16 MED ORDER — HYDROCODONE-ACETAMINOPHEN 5-325 MG PO TABS
1.0000 | ORAL_TABLET | Freq: Four times a day (QID) | ORAL | 0 refills | Status: DC | PRN
  Filled 2023-09-16: qty 10, 3d supply, fill #0

## 2023-09-16 MED ORDER — LIDOCAINE VISCOUS HCL 2 % MT SOLN
15.0000 mL | Freq: Once | OROMUCOSAL | Status: AC
  Administered 2023-09-16: 5 mL via OROMUCOSAL

## 2023-09-16 MED ORDER — MIDAZOLAM HCL 2 MG/2ML IJ SOLN
INTRAMUSCULAR | Status: AC
  Filled 2023-09-16: qty 4

## 2023-09-16 MED ORDER — FENTANYL CITRATE (PF) 100 MCG/2ML IJ SOLN
INTRAMUSCULAR | Status: AC | PRN
  Administered 2023-09-16 (×2): 50 ug via INTRAVENOUS

## 2023-09-16 NOTE — Procedures (Signed)
Vascular and Interventional Radiology Procedure Note  Patient: Jaime Bauer DOB: [birth year] Medical Record Number: [id number] Note Date/Time: 09/16/23 9:38 AM   Performing Physician: Roanna Banning, MD Assistant(s): None  Diagnosis: PO intolerance. Hx MALS post release.  Procedure: PERCUTANEOUS GASTROSTOMY TUBE PLACEMENT  Anesthesia: Conscious Sedation Complications: None Estimated Blood Loss: Minimal  Findings:  Successful placement of a 5F gastrostomy tube under fluoroscopy.   Plan: G-tube to gravity drainage bag x 2 hrs Liquid diet x 24 hrs OK to cap G-tube for 2 hours post liquid meal. OK for meds per tube post procedure. OK to begin TFs and G-tube use in 4 hrs, taper from trickle to goal as tolerated.   Pt is to return to [facility] for routine G-tube exchange in 6 months.  See detailed procedure note with images in PACS. The patient tolerated the procedure well without incident or complication and was returned to Recovery in stable condition.    Roanna Banning, MD [facility] Radiology   Pager. [phone] Clinic. [phone]

## 2023-09-16 NOTE — Progress Notes (Signed)
Patient ID: Jaime Bauer, female   DOB: 03/14/2001, 23 y.o.   MRN: [id number] Per order of Dr. Milford Cage electronic prescription for pt sent to [facility] for Norco 5/325, #10, no refills, 1 tablet every 6 hours as needed for moderate pain

## 2023-09-16 NOTE — Discharge Instructions (Signed)
Please call Interventional Radiology clinic [phone] with any questions or concerns.  You may remove your dressing and shower tomorrow.  After the procedure, it is common to have: Mild pain in your abdomen A small amount of blood-colored fluid leaking from the site of your gastrostomy tube (G-tube)   DO NOT Use your G-tube for at least 24 hours from its insertion.  Follow these instructions at home:  Medication: Do not use Aspirin or ibuprofen products, such as Advil or Motrin, as it may increase bleeding.  You may resume your usual medications as ordered by your doctor. If your doctor prescribed antibiotics, take them as directed. Do not stop taking them just because you feel better. You need to take the full course of antibiotics.  Care of the procedure site  Wash your hands with soap and water for at least 20 seconds Remove the dressing (if there is one) that is between the skin and the tube Check the area where the tube enters the skin. Check daily for problems such as: Redness, rash, or irritation Swelling Pus-like drainage Extra skin growth Moisten the cotton swab or gauze with the saline solution or with a soap-and-water mixture. Gently clean around the insertion site. Remove any drainage or crusted material. When the G-tube is first put in, a normal saline solution or water can be used to clean the skin After the skin around the tube has healed, mild soap and water may be used Apply a dressing (if there should be one) between the skin and the tube If the tube comes out: Cover the opening with a clean dressing and tape Get help right away Do not pull or put tension on the tube  Activity Do not take baths, swim, or use a hot tub until your health care provider approves Keep all follow-up visits as told by your doctor  Contact a health care provider if: You develop constipation or a fever A large amount of fluid or mucus-like liquid is leaking from the tube Skin or  scar tissue appears to be growing where the tube enters the skin The length of tube from the insertion site to the G-tube gets longer  Get help right away if: You have severe pain, tenderness, or bloating in the abdomen Nausea or vomiting Trouble breathing or shortness of breath Any of these problems happen in the area where the tube enters the skin: Redness, irritation, swelling, or soreness Pus-like discharge A bad smell The tube is clogged and cannot be flushed The tube comes out. The tube will need to be put back in within 4 hours

## 2023-09-17 ENCOUNTER — Telehealth: Payer: Self-pay | Admitting: Gastroenterology

## 2023-09-17 NOTE — Telephone Encounter (Signed)
[facility]im notified that they are not able to assist the pt at this time.  He states that the pt and the mother only want education services since they will be handling the G tube feeds themselves.    He does mention that the pt most likely will qualify for up to 40 hours per week of home health care.  They have notified the pt.  I will also send a My Chart message to the pt.

## 2023-09-17 NOTE — Telephone Encounter (Signed)
Greig Castilla with [employer] is calling about a fax they received from [place]. The PT and mother want to education on the g tube. Greig Castilla is requesting a call back to further discuss. [phone]

## 2023-09-22 NOTE — Telephone Encounter (Signed)
 Inbound call from [facility] requesting a call back regarding patients G Tube prescriptions. Call back number is [phone] and ask for [facility]. Please advise, thank you.

## 2023-09-22 NOTE — Telephone Encounter (Signed)
 Spoke with Jill Alexanders at [facility] and gave him the verbal order to remove PICC as pt transitions.  He will fax the order when she is able to move forward with that transition.

## 2023-09-22 NOTE — Telephone Encounter (Signed)
 [facility] would like a verbal order to pull PICC line after transition to enteral formula. They are not sure the timing of the transition due to insurance but they would like to have this in place. Please advise   I can call Jill Alexanders with [facility] and give verbal order when able.

## 2023-09-22 NOTE — Telephone Encounter (Signed)
 This will act as a verbal order: As patient transitions to tube feeds for her nutrition, patient can have PICC line removed via home health services  If a signature order is required, I will do that when I return to the office on Friday.

## 2023-09-25 NOTE — Addendum Note (Signed)
 Encounter addended by: Edward Qualia on: 09/25/2023 10:44 AM  Actions taken: Imaging Exam ended

## 2023-09-30 ENCOUNTER — Encounter: Payer: Self-pay | Admitting: Gastroenterology

## 2023-09-30 ENCOUNTER — Ambulatory Visit (HOSPITAL_COMMUNITY)
Admission: RE | Admit: 2023-09-30 | Discharge: 2023-09-30 | Disposition: A | Discharge status: Home / Self Care | Payer: No Typology Code available for payment source | Source: Ambulatory Visit | Attending: Interventional Radiology | Admitting: Interventional Radiology

## 2023-09-30 ENCOUNTER — Ambulatory Visit: Payer: No Typology Code available for payment source | Admitting: Physician Assistant

## 2023-09-30 ENCOUNTER — Ambulatory Visit (INDEPENDENT_AMBULATORY_CARE_PROVIDER_SITE_OTHER): Payer: No Typology Code available for payment source | Admitting: Gastroenterology

## 2023-09-30 VITALS — BP 90/52 | HR 76 | Ht 68.0 in | Wt 159.4 lb

## 2023-09-30 DIAGNOSIS — G8929 Other chronic pain: Secondary | ICD-10-CM | POA: Insufficient documentation

## 2023-09-30 DIAGNOSIS — K551 Chronic vascular disorders of intestine: Secondary | ICD-10-CM | POA: Diagnosis not present

## 2023-09-30 DIAGNOSIS — R109 Unspecified abdominal pain: Secondary | ICD-10-CM | POA: Diagnosis not present

## 2023-09-30 DIAGNOSIS — I774 Celiac artery compression syndrome: Secondary | ICD-10-CM

## 2023-09-30 DIAGNOSIS — R112 Nausea with vomiting, unspecified: Secondary | ICD-10-CM

## 2023-09-30 DIAGNOSIS — F509 Eating disorder, unspecified: Secondary | ICD-10-CM

## 2023-09-30 DIAGNOSIS — R634 Abnormal weight loss: Secondary | ICD-10-CM

## 2023-09-30 DIAGNOSIS — K5909 Other constipation: Secondary | ICD-10-CM | POA: Diagnosis not present

## 2023-09-30 DIAGNOSIS — R1084 Generalized abdominal pain: Secondary | ICD-10-CM

## 2023-09-30 HISTORY — PX: IR PATIENT EVAL TECH 0-60 MINS: IMG5564

## 2023-09-30 MED ORDER — PROCHLORPERAZINE MALEATE 10 MG PO TABS: 10.0000 mg | ORAL_TABLET | Freq: Four times a day (QID) | ORAL | 3 refills | Status: DC | PRN

## 2023-09-30 NOTE — Procedures (Signed)
 Allred, Rosalita Levan, PA-C  Physician Assistant Radiology   Progress Notes    Signed   Date of Service: 09/30/2023 10:30 AM   Signed      Patient ID: Jaime Bauer, female   DOB: [birth year], 23 y.o.   MRN: [id number] Pt presented today for T tack removals at G tube site, s/p placement 09/16/23. On exam T tacks have spontaneously fallen off. G tube intact, insertion site without erythema; small amount cream colored drainage noted at insertion site; advised pt to keep outer disc taut to skin surface to prevent leakage. May keep 1 layer of drain sponge gauze under disc to keep from irritating skin.

## 2023-09-30 NOTE — Progress Notes (Signed)
 GASTROENTEROLOGY OUTPATIENT CLINIC VISIT   Primary Care Provider Christen Butter, NP [address] [place] [address] [phone]  Patient Profile: Jaime Bauer is a 23 y.o. 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  The patient presents to the [facility] for an evaluation and management of problem(s) noted below:  Problem List 1. Chronic constipation   2. Chronic abdominal pain   3. SMAS (superior mesenteric artery syndrome) (HCC)     History of Present Illness Please see prior notes for full details of HPI.  Interval History The patient is seen in follow-up with her partner.  Since our last evaluation and upper endoscopy being completed, the patient has continued to experience issues of abdominal pain and discomfort.  She is followed up virtually with [facility] as well as [facility].  She has been maintained on TPN.  She now has a G-tube for initiation of tube feeds.  There are issues in regards to getting the tube feeds per pump and so a separate home health agency is currently being worked with by the patient and [facility] for this before she can initiate her tube feeds.  She continues to deal with constipation currently and is using the restroom once per week if that.  She has previously had issues with Linzess as well as Trulance as well as no effectiveness from MiraLAX or Dulcolax in the past.  She is still in school but should be finishing her  bachelor's by May of this year with plan for a gap year before trying to apply for med school.  She has been gaining weight unintentionally which has led to changes in her TPN but that has not made a significant impact in her weight.  She is still eating small amounts overall and without TPN would be certainly losing weight.  She continues to have nausea and is currently using Compazine as needed which causes her less sleepiness than Phenergan.  She is having issues with getting her migraines under control still.  GI Review of Systems Positive as above Negative for odynophagia, dysphagia, melena, hematochezia  Review of Systems General: Denies fevers/chills/unintentional weight loss Cardiovascular: Denies chest pain Pulmonary: Denies shortness of breath Gastroenterological: See HPI Genitourinary: Denies darkened urine Hematological: Denies easy bruising/bleeding Dermatological: Denies jaundice Psychological: Mood remains concerned about her health overall  Medications Current Outpatient Medications  Medication Sig Dispense Refill   AMBULATORY NON FORMULARY MEDICATION TPN per [facility] protocol for labs and dosing 1 each 0   FLUoxetine (PROZAC) 20 MG capsule Take 80 mg by mouth every morning.     levonorgestrel (KYLEENA) 19.5 MG IUD 1 each by Intrauterine route once.     omeprazole (PRILOSEC) 40 MG capsule TAKE 1 CAPSULE BY MOUTH DAILY 90 capsule 2   promethazine (PHENERGAN) 12.5 MG tablet Take 1 tablet (12.5 mg total) by mouth every 8 (eight) hours as needed for nausea or vomiting. 20 tablet 0   Simethicone 125 MG CAPS Take 1 capsule (125 mg total) by mouth in the morning, at noon, in the evening, and at bedtime. 120 capsule 0   prochlorperazine (COMPAZINE)  10 MG tablet Take 1 tablet (10 mg total) by mouth every 6 (six) hours as needed for nausea or vomiting. 30 tablet 3   No current facility-administered medications for this visit.    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   Chronic headaches    Concussion    w/o LOC   Depression    History of multiple concussions    from soccer   Syncope    Past Surgical History:  Procedure Laterality Date   COLONOSCOPY  01/09/2021   IR GASTROSTOMY TUBE MOD SED  09/16/2023   IR PATIENT EVAL TECH 0-60 MINS  05/23/2023   IR PATIENT EVAL TECH 0-60 MINS  09/30/2023   Laparoscopic Median Arcuate Ligament Release     NO PAST SURGERIES     UPPER GASTROINTESTINAL ENDOSCOPY  01/09/2021   2020- [place] keys   Social History   Socioeconomic History   Marital status: Single    Spouse name: Not on file   Number of children: 0   Years of education: 14   Highest education level: Not on file  Occupational History   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  Other Topics Concern   Not on file  Social History Narrative   Right handed   Drinks caffeine   Two story home   Social Drivers of Health   Financial Resource Strain: Not on file  Food Insecurity: No Food Insecurity (09/02/2023)   Received from [facility]   Hunger Vital Sign    Worried About Running Out of Food in the Last Year: Never true    Ran Out of Food in the Last Year: Never true  Transportation Needs: No Transportation Needs (03/12/2023)   Received from [facility] - Transportation    Lack of Transportation (Medical): No    Lack of Transportation (Non-Medical): No  Physical Activity: Inactive (11/08/2022)   Received from [facility]   Exercise Vital Sign    Days of Exercise per Week: 0 days    Minutes of Exercise per Session: 0 min  Stress: Not on file  Social Connections: Unknown (05/13/2022)   Received from  [facility], [facility]   Social Network    Social Network: Not on file  Intimate Partner Violence: Patient Unable To Answer (03/12/2023)   Received from [facility]   Humiliation, Afraid, Rape, and Kick questionnaire    Fear of Current or Ex-Partner: Patient unable to answer    Emotionally Abused: Patient unable to answer    Physically Abused: Patient unable to answer    Sexually Abused: Patient unable to answer   Family History  Problem Relation Age of Onset   Skin cancer Mother    Colon polyps Mother    Cancer Father    Colon cancer Father 64   Colon polyps Father    Rectal cancer Father    Hypertension Paternal Uncle    Cancer Maternal Grandmother    Stroke Maternal Grandfather    GER disease Other    Esophageal cancer Neg Hx    Inflammatory bowel disease Neg Hx    Liver disease  Neg Hx    Pancreatic cancer Neg Hx    Stomach cancer Neg Hx    I have reviewed her medical, social, and family history in detail and updated the electronic medical record as necessary.    PHYSICAL EXAMINATION  BP (!) 90/52 (BP Location: Right Arm, Patient Position: Sitting, Cuff Size: Normal)   Pulse 76   Ht 5\' 8"  (1.727 m) Comment: height measured without shoes  Wt 159 lb 6 oz (72.3 kg)   BMI 24.23 kg/m  GEN: NAD, appears stated age, doesn't appear chronically ill, partner at her side PSYCH: Cooperative, without pressured speech EYE: Conjunctivae pink, sclerae anicteric ENT: MMM CV: Nontachycardic RESP: No audible wheezing GI: NABS, soft, nondistended, G-tube in place without significant erythema under bumper MSK/EXT: No significant lower extremity edema SKIN: No jaundice NEURO:  Alert & Oriented x 3, no focal deficits   REVIEW OF DATA  I reviewed the following data at the time of this encounter:  GI Procedures and Studies  Previously reviewed  Laboratory Studies  Patient's labs reviewed  Imaging Studies  No new imaging studies to review   ASSESSMENT  Ms. Wasko is  a 23 y.o. 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  The patient is seen today for evaluation and management of:  1. Chronic constipation   2. Chronic abdominal pain   3. SMAS (superior mesenteric artery syndrome) (HCC)    The patient is hemodynamically stable.  Clinically, she continues to experience chronic functional abdominal pain which after extensive evaluation was felt to potentially be due to median arcuate ligament syndrome, but unfortunately this did not lead to significant improvement.  She relays that [facility] is considering a repeat celiac axis block, but she is not clear if that really going to be what she follows through with because she did not feel that it made a difference in her symptomatology before.  She has now had an extensive workup with [place] and with 2 other quaternary centers and still continues to have issues overall.  I wonder if there may be some benefit from [place] having her see Dr. Almyra Deforest, who does work significantly with in the functional GI disorder world, though insurance is not accepted.  We will discuss that with her in the future if she is amenable or willing to consider that.  For now we will continue to follow-up virtually with [facility] and with [facility].  Will try to optimize her bowel habits with use of IBS Rella versus potential Amitiza use in the future.  I do think that once she gets her tube feeds initiated, as long as she can tolerate them, she likely will have improvement in her  bowel habits.  Time will tell.  All patient questions were answered to the best of my ability, and the patient agrees to the aforementioned plan of action with follow-up as indicated.   PLAN  Continue TPN for now as per [facility] clinic Initiate tube feeds, once RD from [facility] clinic has worked through the issues of obtaining a pump with the home health service that she is using currently/going to use currently Appreciate psychiatry and  psychology recommendations  Compazine 10 mg every 6 hours as needed with refills to be given Phenergan as needed Laboratories as outlined below to be drawn with her next set of labs per home health Initiate IBS Rella if helpful can send in prescription - If not helpful then Amitiza will be prescribed - Expect once tube feeds can be initiated, hopefully bowel habits will improve Consider referral and evaluation by Dr. Harlon Flor in future   Orders Placed This Encounter  Procedures   TSH   Cortisol     New Prescriptions   No medications on file   Modified Medications   Modified Medication Previous Medication   PROCHLORPERAZINE (COMPAZINE) 10 MG TABLET prochlorperazine (COMPAZINE) 10 MG tablet      Take 1 tablet (10 mg total) by mouth every 6 (six) hours as needed for nausea or vomiting.    Take 1 tablet (10 mg total) by mouth every 6 (six) hours as needed for nausea or vomiting.    Planned Follow Up No follow-ups on file.   Total Time in Face-to-Face and in Coordination of Care for patient including independent/personal interpretation/review of prior testing, medical history, examination, medication adjustment, communicating results with the patient directly, and documentation with the EHR is 30 minutes.   Corliss Parish, MD [facility] Office # [phone]

## 2023-09-30 NOTE — Progress Notes (Signed)
 Patient ID: Jaime Bauer, female   DOB: April 14, 2001, 23 y.o.   MRN: [id number] Pt presented today for T tack removals at G tube site, s/p placement 09/16/23. On exam T tacks have spontaneously fallen off. G tube intact, insertion site without erythema; small amount cream colored drainage noted at insertion site; advised pt to keep outer disc taut to skin surface to prevent leakage. May keep 1 layer of drain sponge gauze under disc to keep from irritating skin.

## 2023-09-30 NOTE — Patient Instructions (Addendum)
 We have given you samples of the following medication to take: Ibsrela- Take 1 tablet once daily.   We have sent the following medications to your pharmacy for you to pick up at your convenience: Compazine   Lab order has been given. Please have Home Health draw labs.  Follow-up on : 12/10/23 at 2:10 pm   _______________________________________________________  If your blood pressure at your visit was 140/90 or greater, please contact your primary care physician to follow up on this.  _______________________________________________________  If you are age 24 or older, your body mass index should be between 23-30. Your Body mass index is 24.23 kg/m. If this is out of the aforementioned range listed, please consider follow up with your Primary Care Provider.  If you are age 24 or younger, your body mass index should be between 19-25. Your Body mass index is 24.23 kg/m. If this is out of the aformentioned range listed, please consider follow up with your Primary Care Provider.   ________________________________________________________  The [facility] providers would like to encourage you to use [facility] to communicate with providers for non-urgent requests or questions.  Due to long hold times on the telephone, sending your provider a message by [facility] may be a faster and more efficient way to get a response.  Please allow 48 business hours for a response.  Please remember that this is for non-urgent requests.  _______________________________________________________  Due to recent changes in healthcare laws, you may see the results of your imaging and laboratory studies on MyChart before your provider has had a chance to review them.  We understand that in some cases there may be results that are confusing or concerning to you. Not all laboratory results come back in the same time frame and the provider may be waiting for multiple results in order to interpret others.  Please give [place] 48 hours  in order for your provider to thoroughly review all the results before contacting the office for clarification of your results.   Thank you for choosing me and [facility].  Dr. Meridee Score

## 2023-10-08 ENCOUNTER — Encounter: Payer: Self-pay | Admitting: Gastroenterology

## 2023-10-10 ENCOUNTER — Encounter: Payer: Self-pay | Admitting: Gastroenterology

## 2023-10-10 MED ORDER — ESOMEPRAZOLE MAGNESIUM 40 MG PO CPDR: 40.0000 mg | DELAYED_RELEASE_CAPSULE | Freq: Every day | ORAL | 3 refills | Status: DC

## 2023-10-10 NOTE — Addendum Note (Signed)
 Addended by: Loretha Stapler on: 10/10/2023 04:34 PM   Modules accepted: Orders

## 2023-10-10 NOTE — Telephone Encounter (Signed)
 Please see the message from the pt and advise

## 2023-11-07 ENCOUNTER — Encounter (HOSPITAL_COMMUNITY): Payer: Self-pay | Admitting: Radiology

## 2023-11-07 ENCOUNTER — Ambulatory Visit (HOSPITAL_COMMUNITY)
Admission: RE | Admit: 2023-11-07 | Discharge: 2023-11-07 | Disposition: A | Discharge status: Home / Self Care | Source: Ambulatory Visit | Attending: Interventional Radiology | Admitting: Interventional Radiology

## 2023-11-07 ENCOUNTER — Other Ambulatory Visit (HOSPITAL_COMMUNITY): Payer: Self-pay | Admitting: Interventional Radiology

## 2023-11-07 DIAGNOSIS — R633 Feeding difficulties, unspecified: Secondary | ICD-10-CM

## 2023-11-07 HISTORY — PX: IR PATIENT EVAL TECH 0-60 MINS: IMG5564

## 2023-11-07 MED ORDER — LIDOCAINE VISCOUS HCL 2 % MT SOLN
OROMUCOSAL | Status: AC
  Filled 2023-11-07: qty 15

## 2023-11-07 NOTE — Progress Notes (Signed)
 Patient ID: Jaime Bauer, female   DOB: 01/29/2001, 23 y.o.   MRN: [id number] Pt presented to IR dept today to evaluate G tube; states that her dog's leash got caught on gastrostomy tubing and partially pulled tube out; prior to arrival in IR pt said she felt tube "pop back in"; on exam tube intact, insertion site clean and dry, no erythema or drainage at site; balloon deflated/about 5 cc saline was removed; balloon was then insufflated with 7 cc saline and pulled taut against ant wall of stomach; external disc was cinched; tube was flushed without difficulty and gastric contents noted on aspiration. Split gauze applied under disc. No immediate complications.

## 2023-11-07 NOTE — Procedures (Signed)
  Patient ID: Jaime Bauer, female   DOB: Feb 23, 2001, 23 y.o.   MRN: [id number] Pt presented to IR dept today to evaluate G tube; states that her dog's leash got caught on gastrostomy tubing and partially pulled tube out; prior to arrival in IR pt said she felt tube "pop back in"; on exam tube intact, insertion site clean and dry, no erythema or drainage at site; balloon deflated/about 5 cc saline was removed; balloon was then insufflated with 7 cc saline and pulled taut against ant wall of stomach; external disc was cinched; tube was flushed without difficulty and gastric contents noted on aspiration. Split gauze applied under disc. No immediate complications.         Cosigned by: Irish Lack, MD at 11/07/2023  4:57 PM

## 2023-11-09 ENCOUNTER — Emergency Department (HOSPITAL_COMMUNITY)
Admission: EM | Admit: 2023-11-09 | Discharge: 2023-11-09 | Disposition: A | Discharge status: Home / Self Care | Attending: Emergency Medicine | Admitting: Emergency Medicine

## 2023-11-09 ENCOUNTER — Other Ambulatory Visit: Payer: Self-pay

## 2023-11-09 ENCOUNTER — Emergency Department (HOSPITAL_COMMUNITY)

## 2023-11-09 ENCOUNTER — Encounter (HOSPITAL_COMMUNITY): Payer: Self-pay

## 2023-11-09 DIAGNOSIS — K9423 Gastrostomy malfunction: Secondary | ICD-10-CM | POA: Diagnosis present

## 2023-11-09 DIAGNOSIS — T85528A Displacement of other gastrointestinal prosthetic devices, implants and grafts, initial encounter: Secondary | ICD-10-CM

## 2023-11-09 HISTORY — DX: Irritable bowel syndrome, unspecified: K58.9

## 2023-11-09 MED ORDER — IOHEXOL 300 MG/ML  SOLN
30.0000 mL | Freq: Once | INTRAMUSCULAR | Status: AC | PRN
  Administered 2023-11-09: 30 mL

## 2023-11-09 NOTE — ED Triage Notes (Addendum)
 Patient stated her G tube balloon deflated and her tube came out at 9:30am today when she woke up. Denies pain. Tube has been in since February. Size 14 french.

## 2023-11-09 NOTE — Discharge Instructions (Signed)
 Call the interventional radiology team tomorrow.  For now you can use the Foley catheter as you would your feeding tube.  If you develop abdominal pain, vomiting, fever, or any other new/concerning symptoms then return to the ER.

## 2023-11-09 NOTE — ED Provider Notes (Signed)
 [place] EMERGENCY DEPARTMENT AT [facility] Provider Note   CSN: [id number] Arrival date & time: 11/09/23  8469     History  Chief Complaint  Patient presents with   G tube Popped Out    Jaime Bauer is a 23 y.o. female.  HPI 23 year old female presents with a displaced PEG tube.  Tube has been in place for almost 2 months.  She saw IR 2 days ago due to concern for partial displacement and her tube was readjusted.  This morning around 15 minutes prior to arrival she noticed the tube had come out.  Seem like there was a pop and the balloon deflated.  Otherwise has not had any issues with the tube or feeding.  Home Medications Prior to Admission medications   Medication Sig Start Date End Date Taking? Authorizing Provider  AMBULATORY NON FORMULARY MEDICATION TPN per [facility] protocol for labs and dosing 02/25/23   Mansouraty, Netty Starring., MD  esomeprazole (NEXIUM) 40 MG capsule Take 1 capsule (40 mg total) by mouth daily at 12 noon. 10/10/23   Mansouraty, Netty Starring., MD  FLUoxetine (PROZAC) 20 MG capsule Take 80 mg by mouth every morning. 02/20/21   [provider]  levonorgestrel (KYLEENA) 19.5 MG IUD 1 each by Intrauterine route once.    [provider]  omeprazole (PRILOSEC) 40 MG capsule TAKE 1 CAPSULE BY MOUTH DAILY 09/01/23   Mansouraty, Netty Starring., MD  prochlorperazine (COMPAZINE) 10 MG tablet Take 1 tablet (10 mg total) by mouth every 6 (six) hours as needed for nausea or vomiting. 09/30/23   Mansouraty, Netty Starring., MD  promethazine (PHENERGAN) 12.5 MG tablet Take 1 tablet (12.5 mg total) by mouth every 8 (eight) hours as needed for nausea or vomiting. 08/11/23   Mansouraty, Netty Starring., MD  Simethicone 125 MG CAPS Take 1 capsule (125 mg total) by mouth in the morning, at noon, in the evening, and at bedtime. 04/16/23   Christen Butter, NP      Allergies    Patient has no known allergies.    Review of Systems   Review of Systems  Physical  Exam Updated Vital Signs BP 113/75   Pulse 81   Temp 98.3 F (36.8 C) (Oral)   Resp 16   Ht 5\' 8"  (1.727 m)   Wt 73 kg   SpO2 100%   BMI 24.47 kg/m  Physical Exam Vitals and nursing note reviewed.  Constitutional:      Appearance: She is well-developed.  HENT:     Head: Normocephalic and atraumatic.  Pulmonary:     Effort: Pulmonary effort is normal.  Abdominal:     Palpations: Abdomen is soft.     Tenderness: There is no abdominal tenderness.     Comments: PEG tube site is without signs of infection  Skin:    General: Skin is warm and dry.  Neurological:     Mental Status: She is alert.     ED Results / Procedures / Treatments   Labs (all labs ordered are listed, but only abnormal results are displayed) Labs Reviewed - No data to display  EKG None  Radiology DG ABDOMEN PEG TUBE LOCATION Result Date: 11/09/2023 CLINICAL DATA:  Dislodged gastrostomy tube. Peg tube placement verification. EXAM: ABDOMEN - 1 VIEW COMPARISON:  09/07/2021, 09/16/2023 FINDINGS: The bowel gas pattern is normal. A gastrostomy tube is noted in the region of the gastric antrum/duodenal bulb. There is a opacification of the proximal duodenum with no evidence of  contrast extravasation. Surgical clips are noted in the epigastric region. IMPRESSION: Enteric tube terminates in the region of the gastric antrum/duodenal bulb with no evidence contrast extravasation. Electronically Signed   By: Thornell Sartorius M.D.   On: 11/09/2023 10:57   IR PATIENT EVAL TECH 0-60 MINS Result Date: 11/07/2023 Arby Barrette     11/07/2023  5:00 PM Patient ID: Yolonda Kida, female   DOB: 2001/01/25, 23 y.o.   MRN: [id number] Pt presented to IR dept today to evaluate G tube; states that her dog's leash got caught on gastrostomy tubing and partially pulled tube out; prior to arrival in IR pt said she felt tube "pop back in"; on exam tube intact, insertion site clean and dry, no erythema or drainage at site; balloon  deflated/about 5 cc saline was removed; balloon was then insufflated with 7 cc saline and pulled taut against ant wall of stomach; external disc was cinched; tube was flushed without difficulty and gastric contents noted on aspiration. Split gauze applied under disc. No immediate complications.     Cosigned by: Irish Lack, MD at 11/07/2023  4:57 PM    Procedures FEEDING TUBE REPLACEMENT  Date/Time: 11/09/2023 10:26 AM  Performed by: Pricilla Loveless, MD Authorized by: Pricilla Loveless, MD  Consent: Verbal consent obtained. Risks and benefits: risks, benefits and alternatives were discussed Preparation: Patient was prepped and draped in the usual sterile fashion. Indications: tube dislodged Local anesthesia used: no  Anesthesia: Local anesthesia used: no  Sedation: Patient sedated: no  Tube type: gastrostomy Patient position: recumbent Procedure type: replacement Tube size: 14 Fr Bulb inflation volume: 5 (ml) Tube placement difficulty: minimal Patient tolerance: patient tolerated the procedure well with no immediate complications Comments: 14 Fr Foley Catheter placed, no G-tube available in her size       Medications Ordered in ED Medications  iohexol (OMNIPAQUE) 300 MG/ML solution 30 mL (30 mLs Per Tube Contrast Given 11/09/23 1047)    ED Course/ Medical Decision Making/ A&P                                 Medical Decision Making Amount and/or Complexity of Data Reviewed External Data Reviewed: notes.    Details: IR notes Radiology: ordered and independent interpretation performed.    Details: No contrast extravasation  Risk Prescription drug management.   Patient's tube was replaced with a Foley catheter of the same size (14 [place]).  Unfortunately we do not have a G-tube that small in the emergency department or in the OR here.  Thus we will leave the Foley catheter in and have her follow-up with IR.  X-ray confirms placement.  Will discharge home with return  precautions.        Final Clinical Impression(s) / ED Diagnoses Final diagnoses:  Dislodged gastrostomy tube    Rx / DC Orders ED Discharge Orders     None         Pricilla Loveless, MD 11/09/23 1437

## 2023-11-10 ENCOUNTER — Other Ambulatory Visit (HOSPITAL_COMMUNITY): Payer: Self-pay | Admitting: Interventional Radiology

## 2023-11-10 ENCOUNTER — Ambulatory Visit (HOSPITAL_COMMUNITY)
Admission: RE | Admit: 2023-11-10 | Discharge: 2023-11-10 | Disposition: A | Discharge status: Home / Self Care | Source: Ambulatory Visit | Attending: Interventional Radiology | Admitting: Interventional Radiology

## 2023-11-10 DIAGNOSIS — F509 Eating disorder, unspecified: Secondary | ICD-10-CM

## 2023-11-10 DIAGNOSIS — R1084 Generalized abdominal pain: Secondary | ICD-10-CM

## 2023-11-10 DIAGNOSIS — R634 Abnormal weight loss: Secondary | ICD-10-CM

## 2023-11-10 DIAGNOSIS — R112 Nausea with vomiting, unspecified: Secondary | ICD-10-CM

## 2023-11-10 HISTORY — PX: IR REPLC GASTRO/COLONIC TUBE PERCUT W/FLUORO: IMG2333

## 2023-11-10 MED ORDER — IOHEXOL 300 MG/ML  SOLN
50.0000 mL | Freq: Once | INTRAMUSCULAR | Status: AC | PRN
  Administered 2023-11-10: 15 mL

## 2023-11-10 MED ORDER — LIDOCAINE VISCOUS HCL 2 % MT SOLN
15.0000 mL | Freq: Once | OROMUCOSAL | Status: AC
  Administered 2023-11-10: 5 mL via OROMUCOSAL

## 2023-11-10 MED ORDER — LIDOCAINE-EPINEPHRINE 1 %-1:100000 IJ SOLN
INTRAMUSCULAR | Status: AC
  Filled 2023-11-10: qty 1

## 2023-12-10 ENCOUNTER — Encounter: Payer: Self-pay | Admitting: Gastroenterology

## 2023-12-10 ENCOUNTER — Ambulatory Visit (INDEPENDENT_AMBULATORY_CARE_PROVIDER_SITE_OTHER): Payer: No Typology Code available for payment source | Admitting: Gastroenterology

## 2023-12-10 VITALS — BP 100/70 | HR 87 | Ht 69.0 in | Wt 158.0 lb

## 2023-12-10 DIAGNOSIS — R112 Nausea with vomiting, unspecified: Secondary | ICD-10-CM

## 2023-12-10 DIAGNOSIS — R109 Unspecified abdominal pain: Secondary | ICD-10-CM

## 2023-12-10 DIAGNOSIS — R634 Abnormal weight loss: Secondary | ICD-10-CM

## 2023-12-10 DIAGNOSIS — R110 Nausea: Secondary | ICD-10-CM | POA: Diagnosis not present

## 2023-12-10 DIAGNOSIS — K581 Irritable bowel syndrome with constipation: Secondary | ICD-10-CM | POA: Diagnosis not present

## 2023-12-10 DIAGNOSIS — G8929 Other chronic pain: Secondary | ICD-10-CM

## 2023-12-10 DIAGNOSIS — R1114 Bilious vomiting: Secondary | ICD-10-CM | POA: Diagnosis not present

## 2023-12-10 DIAGNOSIS — Z931 Gastrostomy status: Secondary | ICD-10-CM

## 2023-12-10 NOTE — Patient Instructions (Addendum)
 Please purchase the following medications over the counter and take as directed: Dulcolax 15 mg three times a week, if not effective then let us  know and Dr Brice Campi will consider Amitiza.      Decrease tube feeding by 1/2 can per day.   Call IR and see if Mickey- low profile tube is available.   Let us  know if you would like to proceed with another celiac block attempt.   Follow up in 3-4 months. Office will contact you when schedule is available.   _______________________________________________________  If your blood pressure at your visit was 140/90 or greater, please contact your primary care physician to follow up on this.  _______________________________________________________  If you are age 65 or older, your body mass index should be between 23-30. Your Body mass index is 23.33 kg/m. If this is out of the aforementioned range listed, please consider follow up with your Primary Care Provider.  If you are age 103 or younger, your body mass index should be between 19-25. Your Body mass index is 23.33 kg/m. If this is out of the aformentioned range listed, please consider follow up with your Primary Care Provider.   ________________________________________________________  The [facility] providers would like to encourage you to use MYCHART to communicate with providers for non-urgent requests or questions.  Due to long hold times on the telephone, sending your provider a message by [facility] may be a faster and more efficient way to get a response.  Please allow 48 business hours for a response.  Please remember that this is for non-urgent requests.  _______________________________________________________  Thank you for choosing me and [facility].  Dr. Brice Campi

## 2023-12-10 NOTE — Progress Notes (Signed)
 GASTROENTEROLOGY OUTPATIENT CLINIC VISIT   Primary Care Provider Cherre Cornish, NP [address] [place] [address] [phone]  Patient Profile: Jaime Bauer is a 23 y.o. 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  The patient presents to the [facility] for an evaluation and management of problem(s) noted below:  Problem List 1. Chronic abdominal pain   2. Irritable bowel syndrome with constipation   3. Bilious vomiting with nausea   4. Unintentional weight loss   5. G tube feedings (HCC)    Discussed the use of AI scribe software for clinical note transcription with the patient, who gave verbal consent to proceed.  History of Present Illness Please see prior notes for full details of HPI.  Interval History The patient is seen in follow-up (today she is accompanied by her partner, and both of her parents).  She continues to struggle with nausea as well as constipation and abdominal pain.  She has transitioned from TPN to G-tube feeding.  She recently had her G tube adjusted by IR and states that a low-profile MICKEE is in process of being obtained.  Her weigh from last clinic visit to today is relatively stable.  Since her very low weights a few years ago, she has gotten up to 158 pounds.  When she was playing soccer actively, she weights 138-145 pounds, so feels heavier than she would like to be.  She has difficulty maintaining an adequate oral intake.  For constipation at this time, she did find IBSrela slightly effective, but it did not get her completely  cleaned out.  Now she is using Dulcolax 15 mg once a week and will have a bowel movement with this.  But will not have another BM until 1 week later.  She has not noted any blood in her stools.  She cannot tolerate PO MiraLAX.  She experiences persistent nausea. She vents her G-tube regularly, especially when feeling nauseous, which she finds helpful at times.  She inquiries about the possibility of adding a clamp to help after feeds have been completed.  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  She is currently engaging in daily walks but finds it difficult to exercise more due to not feeling well most of the time.  There was consideration of whether a repeat Celiac block at [facility] could be helpful, but as she did not find the first one helpful, she is hesitant to have another.   GI Review of Systems Positive as above Negative for odynophagia, dysphagia, melena, hematochezia  Review of Systems General: Denies fevers/chills/unintentional weight loss Cardiovascular: Denies chest pain Pulmonary: Denies shortness of breath Gastroenterological: See HPI Genitourinary: Denies darkened urine Hematological: Denies easy bruising/bleeding Dermatological: Denies jaundice Psychological: Mood remains concerned about her health overall  Medications Current Outpatient Medications  Medication Sig Dispense Refill   AMBULATORY NON FORMULARY MEDICATION TPN per [employer] protocol for labs and dosing 1 each 0   FLUoxetine  (PROZAC ) 20 MG capsule Take 80 mg by mouth every morning.     levonorgestrel (KYLEENA) 19.5 MG IUD 1 each by Intrauterine route once.  prochlorperazine  (COMPAZINE ) 10 MG tablet Take 1 tablet (10 mg total) by mouth every 6 (six) hours as needed for nausea or vomiting. 30 tablet 3   promethazine  (PHENERGAN ) 12.5 MG tablet Take 1 tablet (12.5 mg total) by mouth every 8 (eight) hours as needed  for nausea or vomiting. 20 tablet 0   Simethicone  125 MG CAPS Take 1 capsule (125 mg total) by mouth in the morning, at noon, in the evening, and at bedtime. 120 capsule 0   No current facility-administered medications for this visit.    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   Chronic headaches    Concussion    w/o LOC   Depression    History of multiple concussions    from soccer   IBS (irritable bowel syndrome)    Syncope    Past Surgical History:  Procedure Laterality Date   COLONOSCOPY  01/09/2021   IR GASTROSTOMY TUBE MOD SED  09/16/2023   IR PATIENT EVAL TECH 0-60 MINS  05/23/2023   IR PATIENT EVAL TECH 0-60 MINS  09/30/2023   IR PATIENT EVAL TECH 0-60 MINS  11/07/2023   IR REPLC GASTRO/COLONIC TUBE PERCUT W/FLUORO  11/10/2023   Laparoscopic Median Arcuate Ligament Release     NO PAST SURGERIES     UPPER GASTROINTESTINAL ENDOSCOPY  01/09/2021   2020- Fl keys   Social History   Socioeconomic History   Marital status: Single    Spouse name: Not on file   Number of children: 0   Years of education: 14   Highest education level: Not on file  Occupational History   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  Other Topics Concern   Not on file  Social History Narrative   Right handed   Drinks caffeine   Two story home   Social Drivers of Health   Financial Resource Strain: Not on file  Food Insecurity: No Food Insecurity (12/05/2023)   Received from [facility]   Hunger Vital Sign    Worried About Running Out of Food in the Last Year: Never true    Ran Out of Food in the Last Year: Never true  Transportation Needs: No Transportation  Needs (03/12/2023)   Received from [facility] - Transportation    Lack of Transportation (Medical): No    Lack of Transportation (Non-Medical): No  Physical Activity: Inactive (11/08/2022)   [facility]   Exercise Vital Sign    Days of Exercise per Week: 0 days    Minutes of Exercise per Session: 0 min  Stress: Not on file  Social Connections: Unknown (05/13/2022)   Received from [facility]   Social Network    Social Network: Not on file  Intimate Partner Violence: Patient Unable To Answer (03/12/2023)   Received from [facility]   Humiliation, Afraid, Rape, and Kick questionnaire    Fear of Current or Ex-Partner: Patient unable to answer    Emotionally Abused: Patient unable to answer    Physically Abused: Patient unable to answer    Sexually Abused: Patient unable to answer   Family  History  Problem Relation Age of Onset   Skin cancer Mother    Colon polyps Mother    Cancer Father    Colon cancer Father 82   Colon polyps Father    Rectal cancer Father    Hypertension Paternal Uncle    Cancer Maternal Grandmother    Stroke Maternal Grandfather    GER disease Other    Esophageal cancer Neg Hx    Inflammatory bowel disease Neg Hx    Liver disease Neg Hx    Pancreatic cancer Neg Hx    Stomach cancer Neg Hx    I have reviewed her medical, social, and family history in detail and updated the electronic medical record as necessary.    PHYSICAL EXAMINATION  BP 100/70   Pulse 87   Ht 5\' 9"  (1.753 m)   Wt 158 lb (71.7 kg)   BMI 23.33 kg/m  GEN: NAD, appears stated age, doesn't appear chronically ill, partner and parents at her side PSYCH: Cooperative, without pressured speech EYE: Conjunctivae pink, sclerae anicteric ENT: MMM CV: Nontachycardic RESP: No audible wheezing GI: NABS, soft, nondistended, G-tube in place without significant erythema under bumper MSK/EXT: No significant lower extremity edema SKIN: No  jaundice NEURO:  Alert & Oriented x 3, no focal deficits   REVIEW OF DATA  I reviewed the following data at the time of this encounter:  GI Procedures and Studies  Previously reviewed  Laboratory Studies  Patient's labs reviewed  Imaging Studies  No new imaging studies to review   ASSESSMENT  Ms. Quinteros is a 23 y.o. 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  The patient is seen today for evaluation and management of:  1. Chronic abdominal pain   2. Irritable bowel syndrome with constipation   3. Bilious vomiting with nausea   4. Unintentional weight loss   5. G tube feedings (HCC)    The patient remains hemodynamically stable.  Clinically, she is unchanged.  Chronic functional abdominal pain still remains even after MALS release.  Will try to work on constipation to see if we can try to optimize her bowel habits.  Will use Dulcolax the next couple weeks every 2 to 3 days and if not effective then we will transition to Amitiza with Dulcolax.  Unfortunately Dr. Chere Cordon clinic will no longer be available.  She is unsure about repeat celiac block, though we could consider having that completed here via IR or via EUS.  She will let us  know about this.  Will ask patient to see how she does by decreasing her tube feeds by 1/2 cans per night in an effort of seeing if this will be helpful.  She will reach out to IR to see about low-profile G-tube replacement.  She can use a small clip (potato chip clip) to try to clamp her feeds to minimize backflow.  She may continue to vent her G-tube as needed.  I  congratulated her on her completion of school, graduation this weekend.  All patient questions were answered to the best of my ability, and the patient agrees to the aforementioned plan of action with follow-up as indicated.   PLAN  Continue G-tube feedings per [facility] clinic - See how  she does with regards to weight with goal 145 to 150 pounds - Will try to decrease her intake by one half can per night and monitor weight closely Compazine  10 mg every 6 hours as needed Phenergan  as needed Dulcolax 15 mg 3 times a week - If not improving, then will proceed with Amitiza prescription Follow-up with [facility] and [facility] [place]  - Celiac block could be arranged here in town if needed in future   No orders of the defined types were placed in this encounter.    New Prescriptions   No medications on file   Modified Medications   No medications on file    Planned Follow Up Return in about 4 months (around 04/11/2024).   Total Time in Face-to-Face and in Coordination of Care for patient including independent/personal interpretation/review of prior testing, medical history, examination, medication adjustment, communicating results with the patient directly, and documentation with the EHR is 25 minutes.   Yong Henle, MD [facility] Office # [phone]

## 2023-12-13 ENCOUNTER — Encounter: Payer: Self-pay | Admitting: Gastroenterology

## 2023-12-13 DIAGNOSIS — Z931 Gastrostomy status: Secondary | ICD-10-CM | POA: Insufficient documentation

## 2023-12-13 DIAGNOSIS — K581 Irritable bowel syndrome with constipation: Secondary | ICD-10-CM | POA: Insufficient documentation

## 2023-12-25 ENCOUNTER — Other Ambulatory Visit (HOSPITAL_COMMUNITY): Payer: Self-pay | Admitting: Interventional Radiology

## 2023-12-25 DIAGNOSIS — R633 Feeding difficulties, unspecified: Secondary | ICD-10-CM

## 2023-12-31 ENCOUNTER — Ambulatory Visit (HOSPITAL_COMMUNITY)
Admission: RE | Admit: 2023-12-31 | Discharge: 2023-12-31 | Disposition: A | Discharge status: Home / Self Care | Source: Ambulatory Visit | Attending: Interventional Radiology | Admitting: Interventional Radiology

## 2023-12-31 ENCOUNTER — Other Ambulatory Visit (HOSPITAL_COMMUNITY): Payer: Self-pay | Admitting: Interventional Radiology

## 2023-12-31 DIAGNOSIS — R633 Feeding difficulties, unspecified: Secondary | ICD-10-CM

## 2023-12-31 HISTORY — PX: IR REPLC GASTRO/COLONIC TUBE PERCUT W/FLUORO: IMG2333

## 2023-12-31 MED ORDER — IOHEXOL 300 MG/ML  SOLN
50.0000 mL | Freq: Once | INTRAMUSCULAR | Status: AC | PRN
  Administered 2023-12-31: 10 mL

## 2023-12-31 MED ORDER — LIDOCAINE VISCOUS HCL 2 % MT SOLN
OROMUCOSAL | Status: AC
  Filled 2023-12-31: qty 15

## 2023-12-31 MED ORDER — LIDOCAINE HCL URETHRAL/MUCOSAL 2 % EX GEL: 1.0000 | Freq: Once | CUTANEOUS | Status: DC

## 2024-01-09 ENCOUNTER — Encounter: Payer: Self-pay | Admitting: Gastroenterology

## 2024-01-29 ENCOUNTER — Encounter: Payer: Self-pay | Admitting: Gastroenterology

## 2024-01-30 ENCOUNTER — Other Ambulatory Visit: Payer: Self-pay

## 2024-01-30 MED ORDER — PROCHLORPERAZINE MALEATE 10 MG PO TABS: 10.0000 mg | ORAL_TABLET | Freq: Four times a day (QID) | ORAL | 12 refills | Status: DC | PRN

## 2024-01-30 NOTE — Telephone Encounter (Signed)
 Okay to refill Compazine  with 12 refills. Thanks. GM

## 2024-03-15 ENCOUNTER — Other Ambulatory Visit (HOSPITAL_COMMUNITY): Payer: No Typology Code available for payment source

## 2024-06-14 ENCOUNTER — Encounter: Payer: Self-pay | Admitting: Gastroenterology

## 2024-06-14 NOTE — Telephone Encounter (Signed)
 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 My EUS may take longer for it to be scheduled, but we can coordinate as necessary. Please let me know what she decides. Thanks. GM

## 2024-06-14 NOTE — Telephone Encounter (Signed)
 May Upper EUS Celiac Block. Indication Abdominal Pain, Median Arcuate Ligament Syndrome. Thanks. GM

## 2024-06-15 ENCOUNTER — Other Ambulatory Visit: Payer: Self-pay

## 2024-06-15 DIAGNOSIS — I774 Celiac artery compression syndrome: Secondary | ICD-10-CM

## 2024-06-15 DIAGNOSIS — G8929 Other chronic pain: Secondary | ICD-10-CM

## 2024-06-17 NOTE — Progress Notes (Unsigned)
 Chief Complaint: Abdominal Pain  HPI:    Jaime Bauer is a 23 year old female, known to Dr. 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      01/09/2021 EGD with Z-line irregular at 36 cm from incisors and otherwise normal.    01/09/2021 colonoscopy with hemorrhoids and otherwise normal.    06/18/2023 EGD with no gross lesions, Z-line irregular, 1 cm hiatal hernia and erythematous mucosa in the stomach.  Discussed that if iron deficiency persisted then would recommend VCE.  Stool culture ordered.  Recommended Imodium.  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   4 /7/25 patient had placement of gastrostomy tube.    12/10/2023 office visit with Dr. Wilhelmenia.  At that visit continued to struggle with nausea as well as constipation and abdominal pain.  She had transition from TPN to G-tube feeding.  Weight was stable.  At the time venting her G-tube when she felt nauseous which was helpful.  At that point continue G feeds.  Recommended decreasing her intake by 1/2 cans per night and monitor weight closely.  Compazine  10 mg every 6 hours as needed, Phenergan  as needed.  Dulcolax 50 mg 3 times daily.  If not improving then we will proceed with Amitiza prescription.  Discussed possible celiac block.    12/31/2023 G-tube replaced.  Received a low-profile G-tube.    06/14/2024 patient messaged us .  Plans for an upper EUS celiac block for abdominal pain related to median arcuate ligament syndrome.  This was scheduled 07/27/2024.  Past Medical History:  Diagnosis Date   Anxiety    Celiac  artery compression syndrome 09/2022   Chronic headaches    Concussion    w/o LOC   Depression    History of multiple concussions    from soccer   IBS (irritable bowel syndrome)    Syncope     Past Surgical History:  Procedure Laterality Date   COLONOSCOPY  01/09/2021   IR GASTROSTOMY TUBE MOD SED  09/16/2023   IR PATIENT EVAL TECH 0-60 MINS  05/23/2023   IR PATIENT EVAL TECH 0-60 MINS  09/30/2023   IR PATIENT EVAL TECH 0-60 MINS  11/07/2023   IR REPLC GASTRO/COLONIC TUBE PERCUT W/FLUORO  11/10/2023   IR REPLC GASTRO/COLONIC TUBE PERCUT W/FLUORO  12/31/2023   Laparoscopic Median Arcuate Ligament Release     NO PAST SURGERIES     UPPER GASTROINTESTINAL ENDOSCOPY  01/09/2021   2020- Fl keys    Current Outpatient Medications  Medication Sig Dispense Refill   AMBULATORY NON FORMULARY MEDICATION TPN per [employer] protocol for labs and dosing 1 each 0   FLUoxetine  (PROZAC ) 20 MG capsule Take 80 mg by mouth every morning.     levonorgestrel (KYLEENA) 19.5 MG IUD 1 each by Intrauterine route once.     prochlorperazine  (COMPAZINE ) 10 MG tablet Take 1 tablet (10 mg total) by mouth every 6 (six) hours as needed for nausea or vomiting. 30 tablet 12   promethazine  (PHENERGAN ) 12.5 MG tablet Take 1 tablet (12.5 mg total) by mouth every 8 (eight) hours as needed for nausea or vomiting. 20 tablet 0   Simethicone  125 MG CAPS Take 1  capsule (125 mg total) by mouth in the morning, at noon, in the evening, and at bedtime. 120 capsule 0   No current facility-administered medications for this visit.    Allergies as of 06/18/2024   (No Known Allergies)    Family History  Problem Relation Age of Onset   Skin cancer Mother    Colon polyps Mother    Cancer Father    Colon cancer Father 53   Colon polyps Father    Rectal cancer Father    Hypertension Paternal Uncle    Cancer Maternal Grandmother    Stroke Maternal Grandfather    GER disease Other    Esophageal cancer Neg Hx    Inflammatory bowel  disease Neg Hx    Liver disease Neg Hx    Pancreatic cancer Neg Hx    Stomach cancer Neg Hx     Social History   Socioeconomic History   Marital status: Single    Spouse name: Not on file   Number of children: 0   Years of education: 14   Highest education level: Not on file  Occupational History   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  Other Topics Concern   Not on file  Social History Narrative   Right handed   Drinks caffeine   Two story home   Social Drivers of Health   Financial Resource Strain: Not on file  Food Insecurity: No Food Insecurity (12/05/2023)   Received from [facility]   Hunger Vital Sign    Within the past 12 months, you worried that your food would run out before you got the money to buy more.: Never true    Within the past 12 months, the food you bought just didn't last and you didn't have money to get more.: Never true  Transportation Needs: No Transportation Needs (03/12/2023)   Received from [facility] - Transportation    Lack of Transportation (Medical): No    Lack of Transportation (Non-Medical): No  Physical Activity: Inactive (11/08/2022)   Received from [facility]   Exercise Vital Sign    Days of Exercise per Week: 0 days    Minutes of Exercise per Session: 0 min  Stress: Not on file  Social Connections: Unknown (05/13/2022)   Received from [facility]   Social Network    Social Network: Not on file  Intimate Partner Violence: Patient Unable To Answer (03/12/2023)   Received from [facility]   Humiliation, Afraid, Rape, and Kick questionnaire    Within the last year, have you been afraid of your partner or ex-partner?: Patient unable to answer    Within the last year, have you been humiliated or  emotionally abused in other ways by your partner or ex-partner?: Patient unable to answer    Within the last year, have you been kicked, hit, slapped, or otherwise physically hurt by your partner or ex-partner?: Patient unable to answer    Within the last year, have you been raped or forced to have any kind of sexual activity by your partner or ex-partner?: Patient unable to answer    Review of Systems:    Constitutional: No weight loss, fever, chills, weakness or fatigue HEENT: Eyes: No change  in vision               Ears, Nose, Throat:  No change in hearing or congestion Skin: No rash or itching Cardiovascular: No chest pain, chest pressure or palpitations   Respiratory: No SOB or cough Gastrointestinal: See HPI and otherwise negative Genitourinary: No dysuria or change in urinary frequency Neurological: No headache, dizziness or syncope Musculoskeletal: No new muscle or joint pain Hematologic: No bleeding or bruising Psychiatric: No history of depression or anxiety    Physical Exam:  Vital signs: There were no vitals taken for this visit.  Constitutional:   Pleasant Caucasian female appears to be in NAD, Well developed, Well nourished, alert and cooperative Head:  Normocephalic and atraumatic. Eyes:   PEERL, EOMI. No icterus. Conjunctiva pink. Ears:  Normal auditory acuity. Neck:  Supple Throat: Oral cavity and pharynx without inflammation, swelling or lesion.  Respiratory: Respirations even and unlabored. Lungs clear to auscultation bilaterally.   No wheezes, crackles, or rhonchi.  Cardiovascular: Normal S1, S2. No MRG. Regular rate and rhythm. No peripheral edema, cyanosis or pallor.  Gastrointestinal:  Soft, nondistended, nontender. No rebound or guarding. Normal bowel sounds. No appreciable masses or hepatomegaly. Rectal:  Not performed.  Msk:  Symmetrical without gross deformities. Without edema, no deformity or joint abnormality.  Neurologic:  Alert and  oriented x4;   grossly normal neurologically.  Skin:   Dry and intact without significant lesions or rashes. Psychiatric: Oriented to person, place and time. Demonstrates good judgement and reason without abnormal affect or behaviors.  RELEVANT LABS AND IMAGING: CBC    Component Value Date/Time   WBC 6.4 09/16/2023 0801   RBC 4.16 09/16/2023 0801   HGB 12.1 09/16/2023 0801   HCT 37.1 09/16/2023 0801   PLT 264 09/16/2023 0801   MCV 89.2 09/16/2023 0801   MCH 29.1 09/16/2023 0801   MCHC 32.6 09/16/2023 0801   RDW 12.9 09/16/2023 0801   LYMPHSABS 1.9 09/16/2023 0801   MONOABS 0.6 09/16/2023 0801   EOSABS 0.2 09/16/2023 0801   BASOSABS 0.0 09/16/2023 0801    CMP     Component Value Date/Time   NA 136 09/16/2023 0801   K 4.0 09/16/2023 0801   CL 104 09/16/2023 0801   CO2 23 09/16/2023 0801   GLUCOSE 100 (H) 09/16/2023 0801   BUN 25 (H) 09/16/2023 0801   CREATININE 0.61 09/16/2023 0801   CREATININE 0.80 08/27/2021 1629   CALCIUM 8.9 09/16/2023 0801   PROT 6.9 02/25/2023 1155   ALBUMIN 4.0 02/25/2023 1155   AST 13 (L) 02/25/2023 1155   ALT 12 02/25/2023 1155   ALKPHOS 45 02/25/2023 1155   BILITOT 0.3 02/25/2023 1155   GFRNONAA >60 09/16/2023 0801   GFRNONAA 139 03/19/2023 1233    Assessment: 1.  Chronic abdominal pain: 2.  IBS-C: 3.  Bilious vomiting with nausea: 4.  Unintentional weight loss 5.  G-tube feedings 6.  History of MALS  Plan: 1. ***     Jaime Failing, PA-C [facility] 06/17/2024, 8:17 AM  Cc: [name], NP

## 2024-06-18 ENCOUNTER — Other Ambulatory Visit (HOSPITAL_COMMUNITY): Payer: Self-pay

## 2024-06-18 ENCOUNTER — Encounter: Payer: Self-pay | Admitting: Physician Assistant

## 2024-06-18 ENCOUNTER — Telehealth: Payer: Self-pay

## 2024-06-18 ENCOUNTER — Ambulatory Visit: Admitting: Physician Assistant

## 2024-06-18 VITALS — BP 92/68 | HR 73 | Ht 69.0 in | Wt 146.8 lb

## 2024-06-18 DIAGNOSIS — R109 Unspecified abdominal pain: Secondary | ICD-10-CM | POA: Diagnosis not present

## 2024-06-18 DIAGNOSIS — R110 Nausea: Secondary | ICD-10-CM | POA: Diagnosis not present

## 2024-06-18 DIAGNOSIS — Z931 Gastrostomy status: Secondary | ICD-10-CM

## 2024-06-18 DIAGNOSIS — I774 Celiac artery compression syndrome: Secondary | ICD-10-CM

## 2024-06-18 DIAGNOSIS — K581 Irritable bowel syndrome with constipation: Secondary | ICD-10-CM | POA: Diagnosis not present

## 2024-06-18 DIAGNOSIS — G8929 Other chronic pain: Secondary | ICD-10-CM

## 2024-06-18 MED ORDER — LUBIPROSTONE 8 MCG PO CAPS: 8.0000 ug | ORAL_CAPSULE | Freq: Two times a day (BID) | ORAL | 3 refills | Status: AC

## 2024-06-18 MED ORDER — PROCHLORPERAZINE MALEATE 10 MG PO TABS: 10.0000 mg | ORAL_TABLET | Freq: Four times a day (QID) | ORAL | 12 refills | Status: AC | PRN

## 2024-06-18 NOTE — Patient Instructions (Addendum)
 We have sent the following medications to your pharmacy for you to pick up at your convenience: Amitiza 8 mcg twice daily  Compazine    Referral placed for dietician.

## 2024-06-18 NOTE — Telephone Encounter (Signed)
 Pt notified via mychart

## 2024-06-18 NOTE — Progress Notes (Signed)
 Attending Physician's Attestation   I have reviewed the chart.   I agree with the Advanced Practitioner's note, impression, and recommendations with any updates as below. It is worth attempting another celiac block, but as she did not have great effectiveness with the first 1 via IR, it is not clear if this will be effective but only time will tell.   Aloha Finner, MD [facility] Advanced [facility]oscopy Office # [phone]

## 2024-06-18 NOTE — Telephone Encounter (Signed)
 Pharmacy Patient Advocate Encounter   Received notification from CoverMyMeds that prior authorization for Lubiprostone 8MCG capsules is required/requested.   Insurance verification completed.   The patient is insured through [facility].   Per test claim: PA required; PA submitted to above mentioned insurance via Latent Key/confirmation #/EOC [id number] Status is pending

## 2024-06-18 NOTE — Telephone Encounter (Signed)
 Pharmacy Patient Advocate Encounter  Received notification from [facility] that Prior Authorization for Lubiprostone capsules has been APPROVED from 06-18-2024 to 06-18-2025   PA #/Case ID/Reference #: [id number]

## 2024-06-23 MED ORDER — OMEPRAZOLE 40 MG PO CPDR
40.0000 mg | DELAYED_RELEASE_CAPSULE | Freq: Every day | ORAL | 3 refills | Status: AC
Start: 2024-06-23 — End: ?

## 2024-07-20 ENCOUNTER — Encounter (HOSPITAL_COMMUNITY): Payer: Self-pay | Admitting: Gastroenterology

## 2024-07-20 ENCOUNTER — Telehealth: Payer: Self-pay

## 2024-07-20 NOTE — Telephone Encounter (Signed)
 Procedure:ULTRASOUND UPPER GI Procedure date: 07/27/24 Procedure location: [facility] Arrival Time: 7:30 Spoke with the patient Y/N: Y Any prep concerns? N   Has the patient obtained the prep from the pharmacy ? N Do you have a care partner and transportation: Y Any additional concerns? N

## 2024-07-20 NOTE — Progress Notes (Signed)
 Attempted to obtain medical history for pre op call via telephone, unable to reach at this time. HIPAA compliant voicemail message left requesting return call to pre surgical testing department.

## 2024-07-27 ENCOUNTER — Encounter (HOSPITAL_COMMUNITY): Payer: Self-pay | Admitting: Anesthesiology

## 2024-07-27 ENCOUNTER — Ambulatory Visit (HOSPITAL_COMMUNITY): Payer: Self-pay | Admitting: Anesthesiology

## 2024-07-27 ENCOUNTER — Ambulatory Visit (HOSPITAL_COMMUNITY)
Admission: RE | Admit: 2024-07-27 | Discharge: 2024-07-27 | Disposition: A | Discharge status: Home / Self Care | Attending: Gastroenterology | Admitting: Gastroenterology

## 2024-07-27 ENCOUNTER — Other Ambulatory Visit: Payer: Self-pay

## 2024-07-27 ENCOUNTER — Encounter (HOSPITAL_COMMUNITY): Admission: RE | Disposition: A | Payer: Self-pay | Source: Home / Self Care | Attending: Gastroenterology

## 2024-07-27 ENCOUNTER — Encounter (HOSPITAL_COMMUNITY): Payer: Self-pay | Admitting: Gastroenterology

## 2024-07-27 DIAGNOSIS — N2889 Other specified disorders of kidney and ureter: Secondary | ICD-10-CM

## 2024-07-27 DIAGNOSIS — I774 Celiac artery compression syndrome: Secondary | ICD-10-CM

## 2024-07-27 DIAGNOSIS — R1010 Upper abdominal pain, unspecified: Secondary | ICD-10-CM | POA: Diagnosis not present

## 2024-07-27 DIAGNOSIS — R109 Unspecified abdominal pain: Secondary | ICD-10-CM | POA: Diagnosis present

## 2024-07-27 DIAGNOSIS — F419 Anxiety disorder, unspecified: Secondary | ICD-10-CM | POA: Diagnosis not present

## 2024-07-27 DIAGNOSIS — F418 Other specified anxiety disorders: Secondary | ICD-10-CM | POA: Diagnosis not present

## 2024-07-27 DIAGNOSIS — F32A Depression, unspecified: Secondary | ICD-10-CM | POA: Insufficient documentation

## 2024-07-27 DIAGNOSIS — Z01812 Encounter for preprocedural laboratory examination: Secondary | ICD-10-CM

## 2024-07-27 DIAGNOSIS — I899 Noninfective disorder of lymphatic vessels and lymph nodes, unspecified: Secondary | ICD-10-CM | POA: Diagnosis not present

## 2024-07-27 DIAGNOSIS — Z8719 Personal history of other diseases of the digestive system: Secondary | ICD-10-CM | POA: Diagnosis not present

## 2024-07-27 DIAGNOSIS — K2289 Other specified disease of esophagus: Secondary | ICD-10-CM

## 2024-07-27 DIAGNOSIS — Z931 Gastrostomy status: Secondary | ICD-10-CM

## 2024-07-27 DIAGNOSIS — K449 Diaphragmatic hernia without obstruction or gangrene: Secondary | ICD-10-CM | POA: Diagnosis not present

## 2024-07-27 DIAGNOSIS — R519 Headache, unspecified: Secondary | ICD-10-CM | POA: Diagnosis not present

## 2024-07-27 DIAGNOSIS — G8929 Other chronic pain: Secondary | ICD-10-CM

## 2024-07-27 DIAGNOSIS — K3189 Other diseases of stomach and duodenum: Secondary | ICD-10-CM

## 2024-07-27 DIAGNOSIS — K2970 Gastritis, unspecified, without bleeding: Secondary | ICD-10-CM

## 2024-07-27 HISTORY — PX: ESOPHAGOGASTRODUODENOSCOPY: SHX5428

## 2024-07-27 HISTORY — PX: NEUROLYTIC CELIAC PLEXUS: SHX5435

## 2024-07-27 HISTORY — PX: EUS: SHX5427

## 2024-07-27 LAB — PREGNANCY, URINE: Preg Test, Ur: NEGATIVE

## 2024-07-27 SURGERY — ULTRASOUND, UPPER GI TRACT, ENDOSCOPIC
Anesthesia: Monitor Anesthesia Care

## 2024-07-27 MED ORDER — TRIAMCINOLONE ACETONIDE 40 MG/ML IJ SUSP
INTRAMUSCULAR | Status: AC
  Filled 2024-07-27: qty 2

## 2024-07-27 MED ORDER — PHENYLEPHRINE 80 MCG/ML (10ML) SYRINGE FOR IV PUSH (FOR BLOOD PRESSURE SUPPORT)
PREFILLED_SYRINGE | INTRAVENOUS | Status: DC | PRN
  Administered 2024-07-27: 80 ug via INTRAVENOUS

## 2024-07-27 MED ORDER — LACTATED RINGERS IV SOLN
INTRAVENOUS | Status: AC | PRN
  Administered 2024-07-27: 1000 mL via INTRAVENOUS

## 2024-07-27 MED ORDER — SODIUM CHLORIDE 0.9 % IV SOLN: INTRAVENOUS | Status: DC

## 2024-07-27 MED ORDER — LIDOCAINE 2% (20 MG/ML) 5 ML SYRINGE
INTRAMUSCULAR | Status: DC | PRN
  Administered 2024-07-27: 40 mg via INTRAVENOUS

## 2024-07-27 MED ORDER — PROPOFOL 10 MG/ML IV BOLUS
INTRAVENOUS | Status: AC
  Filled 2024-07-27: qty 20

## 2024-07-27 MED ORDER — PROPOFOL 500 MG/50ML IV EMUL
INTRAVENOUS | Status: DC | PRN
  Administered 2024-07-27: 60 mg via INTRAVENOUS
  Administered 2024-07-27: 150 ug/kg/min via INTRAVENOUS
  Administered 2024-07-27: 90 mg via INTRAVENOUS
  Administered 2024-07-27: 130 mg via INTRAVENOUS
  Administered 2024-07-27: 30 mg via INTRAVENOUS
  Administered 2024-07-27: 50 mg via INTRAVENOUS
  Administered 2024-07-27: 20 mg via INTRAVENOUS
  Administered 2024-07-27: 40 mg via INTRAVENOUS
  Administered 2024-07-27 (×2): 50 mg via INTRAVENOUS

## 2024-07-27 MED ORDER — PROPOFOL 500 MG/50ML IV EMUL
INTRAVENOUS | Status: AC
  Filled 2024-07-27: qty 50

## 2024-07-27 MED ORDER — TRIAMCINOLONE ACETONIDE 40 MG/ML IJ SUSP
INTRAMUSCULAR | Status: DC | PRN
  Administered 2024-07-27: 20 mL via PERINEURAL

## 2024-07-27 MED ORDER — BUPIVACAINE HCL (PF) 0.25 % IJ SOLN
20.0000 mL | Freq: Once | INTRAMUSCULAR | Status: DC
  Filled 2024-07-27: qty 20

## 2024-07-27 MED ORDER — DEXMEDETOMIDINE HCL IN NACL 80 MCG/20ML IV SOLN
INTRAVENOUS | Status: DC | PRN
  Administered 2024-07-27 (×3): 4 ug via INTRAVENOUS

## 2024-07-27 NOTE — Anesthesia Procedure Notes (Signed)
 Procedure Name: MAC Date/Time: 07/27/2024 9:14 AM  Performed by: Erick Fitz, CRNAPre-anesthesia Checklist: Patient identified, Emergency Drugs available, Suction available, Patient being monitored and Timeout performed Patient Re-evaluated:Patient Re-evaluated prior to induction Oxygen Delivery Method: Simple face mask Preoxygenation: Pre-oxygenation with 100% oxygen (POM mask) Induction Type: IV induction Placement Confirmation: positive ETCO2 and CO2 detector Dental Injury: Teeth and Oropharynx as per pre-operative assessment

## 2024-07-27 NOTE — Anesthesia Postprocedure Evaluation (Signed)
"   Anesthesia Post Note  Patient: Jaime Bauer  Procedure(s) Performed: ULTRASOUND, UPPER GI TRACT, ENDOSCOPIC DESTRUCTION, CELIAC PLEXUS, USING NEUROLYTIC AGENT EGD (ESOPHAGOGASTRODUODENOSCOPY)     Patient location during evaluation: Endoscopy Anesthesia Type: MAC Level of consciousness: awake and alert Pain management: pain level controlled Vital Signs Assessment: post-procedure vital signs reviewed and stable Respiratory status: spontaneous breathing, nonlabored ventilation, respiratory function stable and patient connected to nasal cannula oxygen Cardiovascular status: stable and blood pressure returned to baseline Postop Assessment: no apparent nausea or vomiting Anesthetic complications: no   There were no known notable events for this encounter.  Last Vitals:  Vitals:   07/27/24 1030 07/27/24 1040  BP: (!) 98/59 102/75  Pulse: (!) 58 73  Resp: 15 19  Temp:    SpO2: 98% 100%    Last Pain:  Vitals:   07/27/24 1040  TempSrc:   PainSc: 0-No pain                 Massai Hankerson L Elleni Mozingo      "

## 2024-07-27 NOTE — H&P (Addendum)
 "  GASTROENTEROLOGY PROCEDURE H&P NOTE   Primary Care Physician: Jaime Mini, NP  HPI: Jaime Bauer is a 23 y.o. 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   Chronic headaches    Concussion    w/o LOC   Depression    History of multiple concussions    from soccer   IBS (irritable bowel syndrome)    Syncope    Past Surgical History:  Procedure Laterality Date   COLONOSCOPY  01/09/2021   IR GASTROSTOMY TUBE MOD SED  09/16/2023   IR PATIENT EVAL TECH 0-60 MINS  05/23/2023   IR PATIENT EVAL TECH 0-60 MINS  09/30/2023   IR PATIENT EVAL TECH 0-60 MINS  11/07/2023   IR REPLC GASTRO/COLONIC TUBE PERCUT W/FLUORO  11/10/2023   IR REPLC GASTRO/COLONIC TUBE PERCUT W/FLUORO  12/31/2023   Laparoscopic Median Arcuate Ligament Release     NO PAST SURGERIES     UPPER GASTROINTESTINAL ENDOSCOPY  01/09/2021   2020- Fl keys   Current Facility-Administered Medications  Medication Dose Route Frequency Provider Last Rate Last Admin   0.9 %  sodium chloride  infusion   Intravenous Continuous Mansouraty, Aloha Raddle., MD       Current Medications[1] Allergies[2] Family History  Problem Relation Age of Onset   Skin cancer Mother    Colon polyps Mother    Cancer Father    Colon cancer Father 84   Colon polyps Father    Rectal cancer Father    Hypertension Paternal Uncle    Cancer Maternal Grandmother    Stroke Maternal Grandfather    GER disease Other    Esophageal cancer Neg Hx    Inflammatory bowel disease Neg Hx    Liver disease Neg Hx    Pancreatic cancer Neg Hx    Stomach cancer Neg Hx    Social History   Socioeconomic History   Marital status: Single    Spouse name: Not on file   Number of children: 0   Years of education: 14   Highest education level: Not on file  Occupational History   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  Other Topics Concern   Not on file  Social History Narrative   Right handed   Drinks caffeine   Two story home   Social Drivers of Health   Tobacco Use: Low Risk (07/27/2024)   Patient History    Smoking Tobacco Use: Never    Smokeless Tobacco Use: Never    Passive Exposure: Not on file  Financial Resource Strain: Not on file  Food Insecurity: No Food Insecurity (12/05/2023)   Received from [facility]    Within the past 12 months, you worried that your food would run out before you got the money to buy more.: Never true    Within the past 12 months, the food you bought just didn't last and you didn't have money to get more.: Never true  Transportation Needs: No Transportation Needs (03/12/2023)   Received from [facility] - Transportation    Lack of Transportation (Medical): No  Lack of Transportation (Non-Medical): No  Physical Activity: Inactive (11/08/2022)   Received [facility]   Exercise Vital Sign    Days of Exercise per Week: 0 days    Minutes of Exercise per Session: 0 min  Stress: Not on file  Social Connections: Unknown (05/13/2022)   Received from [facility]   Social Network    Social Network: Not on file  Intimate Partner Violence: Patient Unable To Answer (03/12/2023)   Received from [facility]   Epic    Within the last year, have you been afraid of your partner or ex-partner?: Patient unable to answer    Within the last year, have you been humiliated or emotionally abused in other ways by your partner or ex-partner?: Patient unable to answer    Within the last year, have you been kicked, hit, slapped, or otherwise physically hurt by your partner or ex-partner?: Patient unable to answer    Within the last year, have you been raped or  forced to have any kind of sexual activity by your partner or ex-partner?: Patient unable to answer  Depression (PHQ2-9): Low Risk (06/20/2023)   Depression (PHQ2-9)    PHQ-2 Score: 0  Recent Concern: Depression (PHQ2-9) - High Risk (04/16/2023)   Depression (PHQ2-9)    PHQ-2 Score: 13  Alcohol Screen: Not on file  Housing: Not on file  Utilities: Not At Risk (03/12/2023)   Received from [facility] Utilities    Threatened with loss of utilities: No  Health Literacy: Not on file    Physical Exam: Today's Vitals   07/27/24 0751  BP: (!) 120/90  Pulse: 75  Resp: 10  Temp: 97.9 F (36.6 C)  TempSrc: Temporal  SpO2: 98%  PainSc: 7    There is no height or weight on file to calculate BMI. GEN: NAD EYE: Sclerae anicteric ENT: MMM CV: Non-tachycardic GI: Soft, G tube in place, 7/10 pain preprocedure NEURO:  Alert & Oriented x 3  Lab Results: No results for input(s): WBC, HGB, HCT, PLT in the last 72 hours. BMET No results for input(s): NA, K, CL, CO2, GLUCOSE, BUN, CREATININE, CALCIUM in the last 72 hours. LFT No results for input(s): PROT, ALBUMIN, AST, ALT, ALKPHOS, BILITOT, BILIDIR, IBILI in the last 72 hours. PT/INR No results for input(s): LABPROT, INR in the last 72 hours.   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  The risks of an EUS including intestinal perforation, bleeding, infection, aspiration, and medication effects were discussed as was the possibility it may not give a definitive diagnosis if a biopsy is performed.  When a biopsy of the pancreas is done as part of the EUS, there is an additional risk of pancreatitis at the rate of about 1-2%.  It was explained that procedure related pancreatitis is typically mild, although it can be severe and even life threatening, which is why we do not perform random pancreatic biopsies and only biopsy a  lesion/area we feel is concerning enough to warrant the risk.   The risks and benefits of endoscopic evaluation/treatment were discussed with the patient and/or family; these include but are not limited to the risk of perforation, infection, bleeding, missed lesions, lack of diagnosis, severe illness requiring hospitalization, as well as anesthesia and sedation related illnesses.  The patient's history has been reviewed, patient examined, no change in status, and deemed stable for procedure.  The patient and/or family  was provided an opportunity to ask questions and all were answered.  The patient and/or family is agreeable to proceed.    Aloha Finner, MD [facility] Office # [phone]     [1]  Current Facility-Administered Medications:    0.9 %  sodium chloride  infusion, , Intravenous, Continuous, Mansouraty, Aloha Raddle., MD [2] No Known Allergies  "

## 2024-07-27 NOTE — Discharge Instructions (Signed)

## 2024-07-27 NOTE — Anesthesia Preprocedure Evaluation (Addendum)
"                                    Anesthesia Evaluation  Patient identified by MRN, date of birth, ID band Patient awake    Reviewed: Allergy & Precautions, NPO status , Patient's Chart, lab work & pertinent test results  Airway Mallampati: I  TM Distance: >3 FB Neck ROM: Full    Dental  (+) Dental Advisory Given, Chipped,    Pulmonary neg pulmonary ROS   Pulmonary exam normal breath sounds clear to auscultation       Cardiovascular negative cardio ROS Normal cardiovascular exam Rhythm:Regular Rate:Normal     Neuro/Psych  Headaches PSYCHIATRIC DISORDERS Anxiety Depression       GI/Hepatic negative GI ROS, Neg liver ROS,,,  Endo/Other  negative endocrine ROS    Renal/GU negative Renal ROS  negative genitourinary   Musculoskeletal negative musculoskeletal ROS (+)    Abdominal   Peds  Hematology negative hematology ROS (+)   Anesthesia Other Findings Celiac artery compression syndrome, G tube dependent, can take a small amount PO  Reproductive/Obstetrics                              Anesthesia Physical Anesthesia Plan  ASA: 2  Anesthesia Plan: MAC   Post-op Pain Management:    Induction: Intravenous  PONV Risk Score and Plan: Propofol  infusion and Treatment may vary due to age or medical condition  Airway Management Planned: Natural Airway  Additional Equipment:   Intra-op Plan:   Post-operative Plan:   Informed Consent: I have reviewed the patients History and Physical, chart, labs and discussed the procedure including the risks, benefits and alternatives for the proposed anesthesia with the patient or authorized representative who has indicated his/her understanding and acceptance.     Dental advisory given  Plan Discussed with: CRNA  Anesthesia Plan Comments:          Anesthesia Quick Evaluation  "

## 2024-07-27 NOTE — Transfer of Care (Signed)
 Immediate Anesthesia Transfer of Care Note  Patient: Jaime Bauer  Procedure(s) Performed: ULTRASOUND, UPPER GI TRACT, ENDOSCOPIC DESTRUCTION, CELIAC PLEXUS, USING NEUROLYTIC AGENT EGD (ESOPHAGOGASTRODUODENOSCOPY)  Patient Location: Endoscopy Unit  Anesthesia Type:MAC  Level of Consciousness: awake, alert , oriented, and patient cooperative  Airway & Oxygen Therapy: Patient Spontanous Breathing and Patient connected to face mask oxygen  Post-op Assessment: Report given to RN and Post -op Vital signs reviewed and stable  Post vital signs: Reviewed and stable  Last Vitals:  Vitals Value Taken Time  BP 110/68 07/27/24 09:54  Temp    Pulse 66 07/27/24 09:56  Resp 18 07/27/24 09:56  SpO2 99 % 07/27/24 09:56  Vitals shown include unfiled device data.  Last Pain:  Vitals:   07/27/24 0751  TempSrc: Temporal  PainSc: 7          Complications: There were no known notable events for this encounter.

## 2024-07-27 NOTE — Op Note (Signed)
 [facility] Patient Name: Jaime Bauer Procedure Date: 07/27/2024 MRN: [id number] Attending MD: Aloha Finner , MD, [id number] Date of Birth: 2001/03/12 CSN: [id number] Age: 23 Admit Type: Outpatient Procedure:                Upper EUS Indications:              Upper abdominal pain, Personal history of digestive                            disease (unspecified) Median Arcuate Ligament                            Syndrome for attempt Pain Control Providers:                Aloha Finner, MD, Olam Riedel, RN, Corene Southgate,                            Technician Referring MD:              Medicines:                Monitored Anesthesia Care Complications:            No immediate complications. Estimated Blood Loss:     Estimated blood loss was minimal. Procedure:                Pre-Anesthesia Assessment:                           - Prior to the procedure, a History and Physical                            was performed, and patient medications and                            allergies were reviewed. The patient's tolerance of                            previous anesthesia was also reviewed. The risks                            and benefits of the procedure and the sedation                            options and risks were discussed with the patient.                            All questions were answered, and informed consent                            was obtained. Prior Anticoagulants: The patient has                            taken no anticoagulant or antiplatelet agents. ASA  Grade Assessment: II - A patient with mild systemic                            disease. After reviewing the risks and benefits,                            the patient was deemed in satisfactory condition to                            undergo the procedure.                           After obtaining informed consent, the endoscope was                            passed  under direct vision. Throughout the                            procedure, the patient's blood pressure, pulse, and                            oxygen saturations were monitored continuously. The                            [id number]-[id number] ([id number]) Olympus endoscope was introduced                            through the mouth, and advanced to the second part                            of duodenum. The [id number] ([id number]) Olympus                            endosonoscope was introduced through the mouth, and                            advanced to the duodenum for ultrasound examination                            from the esophagus, stomach and duodenum. The upper                            EUS was accomplished without difficulty. The                            patient tolerated the procedure. Scope In: Scope Out: Findings:      ENDOSCOPIC FINDING: :      No gross lesions were noted in the entire esophagus.      The Z-line was irregular and was found 35 cm from the incisors.      A 1 cm hiatal hernia was present.      There was evidence of a patent gastrostomy with no G-tube present on the       anterior wall of the stomach. This was characterized by  healthy       appearing mucosa.      Patchy mildly erythematous mucosa without bleeding was found in the       entire examined stomach. Biopsies were taken with a cold forceps for       histology and Helicobacter pylori testing.      No gross lesions were noted in the duodenal bulb, in the first portion       of the duodenum and in the second portion of the duodenum.      The major papilla was normal.      ENDOSONOGRAPHIC FINDING: :      There was no sign of significant endosonographic abnormality in the       entire pancreas. No masses, no cysts, no calcifications, the pancreatic       duct was regular in contour without ductal dilation noted throughout.      There was no sign of significant endosonographic abnormality in the       common bile duct  and in the common hepatic duct. An unremarkable       gallbladder, no stones, no biliary sludge and ducts of normal caliber       were identified.      Endosonographic imaging of the ampulla showed no intramural       (subepithelial) lesion.      Endosonographic imaging in the visualized portion of the liver showed no       mass.      No malignant-appearing lymph nodes were visualized in the celiac region       (level 20), peripancreatic region and porta hepatis region.      A lesion suggestive of a cyst was identified in the visualized portion       of the left kidney. There was a single compartment.      Endosonographic imaging in the gastroesophageal junction showed no mass.      The region of the celiac plexus and celiac ganglia was visualized.       Celiac plexus block was performed. The region of the celiac plexus and       celiac ganglia was identified endosonographically with Color Doppler       imaging, using the take-off of the celiac trunk from the anterior aspect       of the aorta as the main anatomical landmark. Color Doppler guidance was       also used to confirm a lack of significant vascular structures within       the injection needle path. Using a transgastric approach, a 20 gauge       celiac plexus needle was advanced to an injection site in the area of       the celiac artery take-off. Needle aspiration was performed prior to       injection to exclude entry into a blood vessel. A total of 20 mL of       0.25% bupivacaine  and 2 mg of triamcinolone  (40 mg/mL) were injected for       the celiac plexus block. The needle was then withdrawn.      The esophagus, stomach and duodenum were examined endosonographically. Impression:               EGD Impression:                           - No gross lesions in the entire esophagus. Z-line  irregular, 35 cm from the incisors.                           - 1 cm hiatal hernia.                           -  Patent gastrostomy with no G-tube present                            characterized by healthy appearing mucosa.                           - Erythematous mucosa in the stomach. Biopsied.                           - No gross lesions in the duodenal bulb, in the                            first portion of the duodenum and in the second                            portion of the duodenum.                           - Normal major papilla.                           EUS Impression:                           - There was no sign of significant pathology in the                            entire pancreas with normal appearing pancreatic                            duct.                           - There was no sign of significant pathology in the                            common bile duct and in the common hepatic duct.                           - No malignant-appearing lymph nodes were                            visualized in the celiac region (level 20),                            peripancreatic region and porta hepatis region.                           - A cystic lesion was identified in the left  kidney.                           - Celiac plexus block performed. Moderate Sedation:      Not Applicable - Patient had care per Anesthesia. Recommendation:           - The patient will be observed post-procedure,                            until all discharge criteria are met.                           - Discharge patient to home.                           - Patient has a contact number available for                            emergencies. The signs and symptoms of potential                            delayed complications were discussed with the                            patient. Return to normal activities tomorrow.                            Written discharge instructions were provided to the                            patient.                           - Resume previous diet.                            - Continue G-tube feeding as per dietary protocol.                           - Observe patient's clinical course.                           - Await path results.                           - The findings and recommendations were discussed                            with the patient.                           - The findings and recommendations were discussed                            with the designated responsible adult. Procedure Code(s):        --- Professional ---  [phone], Esophagogastroduodenoscopy, flexible,                            transoral; with transendoscopic ultrasound-guided                            transmural injection of diagnostic or therapeutic                            substance(s) (eg, anesthetic, neurolytic agent) or                            fiducial marker(s) (includes endoscopic ultrasound                            examination of the esophagus, stomach, and either                            the duodenum or a surgically altered stomach where                            the jejunum is examined distal to the anastomosis)                           43239, 59, Esophagogastroduodenoscopy, flexible,                            transoral; with biopsy, single or multiple Diagnosis Code(s):        --- Professional ---                           N28.89, Other specified disorders of kidney and                            ureter                           K22.89, Other specified disease of esophagus                           K44.9, Diaphragmatic hernia without obstruction or                            gangrene                           Z93.1, Gastrostomy status                           K31.89, Other diseases of stomach and duodenum                           I89.9, Noninfective disorder of lymphatic vessels                            and lymph nodes, unspecified  R10.10, Upper abdominal pain, unspecified                            Z87.19, Personal history of other diseases of the                            digestive system CPT copyright 2022 [employer]. All rights reserved. The codes documented in this report are preliminary and upon coder review may  be revised to meet current compliance requirements. Aloha Finner, MD 07/27/2024 10:00:25 AM Number of Addenda: 0

## 2024-07-28 LAB — SURGICAL PATHOLOGY

## 2024-07-29 ENCOUNTER — Encounter (HOSPITAL_COMMUNITY): Payer: Self-pay | Admitting: Gastroenterology

## 2024-07-30 ENCOUNTER — Ambulatory Visit: Payer: Self-pay | Admitting: Gastroenterology

## 2024-08-03 ENCOUNTER — Encounter: Payer: Self-pay | Admitting: Skilled Nursing Facility1

## 2024-08-03 ENCOUNTER — Ambulatory Visit

## 2024-08-03 ENCOUNTER — Encounter: Admitting: Skilled Nursing Facility1

## 2024-08-03 DIAGNOSIS — R634 Abnormal weight loss: Secondary | ICD-10-CM | POA: Diagnosis present

## 2024-08-03 DIAGNOSIS — R110 Nausea: Secondary | ICD-10-CM | POA: Insufficient documentation

## 2024-08-03 NOTE — Progress Notes (Signed)
 Medical Nutrition Therapy  Appointment Start time:  7:48  Appointment End time:  8:18  Primary concerns today: to meet her nutrient needs  Referral diagnosis: R11, I77.4, R63.4  NUTRITION ASSESSMENT    Clinical Medical Hx: G-Tube placed, IBS-C, ARFID Medications: see list Labs: triglycerides 46 Notable Signs/Symptoms: nausea, constipation, ARFID flare resulting in only 1 safe food currently   Lifestyle & Dietary Hx  Pt arrives with her supportive partner whom she lives with.  Pt states she has ongoing digestive issues and not able to eat much lately. 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   Pts Current G tube regimen: Jevity 1.2: infinity pump 12 hours 35ml/hr (reduced hours due to finding it annoying being attached all day): current intake Total Volume: 540 mL (approx. 2.25 cartons)Total Calories: 648 kcalTotal Protein: 30 g Free Water : 436 mL  She is currently receiving less than 40% of her maintenance needs. This is considered hypocaloric feeding; this will lead to rapid muscle wasting, electrolyte imbalances, and worsening GI motility.  The ensure she increases her intake this Dietitian advised an increase to 58ml/hr over 16 hours and meet the recommended 4 times a day water  flushes as prescribed by her doctor TO START as it does not meet her fluid needs with the understanding this will be changed by the [employer] dietitian in the next few days advising she inform that dietitian she is not wanting to be attached to her pump for 24 hour as she wants to just do night feeds; this dietitian advised pt due to the low volume ability of her stomach she will have to stretch her stomach to the appropriate volume before she can get to a lessened run time to reduce risk of nausea and pain in stomach.   24 hr recall:  Maybe some coffee,  maybe some water  or olipop   6pm: air fried french fries   Safe Foods: list got smaller the last 1 or 2 month French fries  60 ml 4 times a day   Estimated daily fluid intake:  676 ml Supplements:  Sleep:  Stress / self-care: high stress level Current average weekly physical activity: walking once a day 15 minutes some days feeling weak after and others feeling good    NUTRITION DIAGNOSIS  Inadequate energy intake (NI-1.2) Related to suboptimal enteral nutrition infusion duration (12 hours at 45 mL/hr) and patient intolerance Evidence by current intake of 648 kcal/day, which is only ~40% of estimated requirements (minimum 1,600 kcal to start) for her height and weight.   NUTRITION INTERVENTION  Nutrition education (E-1) on the following topics:  Educated pt on Vagus nerve and gut brain interaction within the context of ARFID  Educated pt on malnutrition resulting from only meeting 40% of her needs  Educated pt on proper nutrition support through her DME company  Learning Style & Readiness for Change Teaching method utilized: Patent Attorney & Auditory  Demonstrated degree of understanding via: Teach Back  Barriers to learning/adherence to lifestyle change: ARFID  Goals Established by Pt This regimen is just to start while you are getting in touch with the [facility] dietitian Metric 16-Hour Schedule  Rate 84 mL/hr  Total Volume 1,344 mL  Protein 74 g  Off-Pump Time 8 Hours  Hit recommendation for fluid flushes 4 times a day  Get in touch with  [facility] dietitian: they will most likely get you on the correct pump regimen Try sweet potato fries or even try making your own fries at home   MONITORING & EVALUATION Dietary intake, weekly physical activity  Next Steps  Patient is to call her DME companies dietitian today but can reach back out to this dietitian via phone or email for PO intake options/questions.
